# Patient Record
Sex: Male | Born: 1955 | Hispanic: No | Marital: Married | Smoking: Never smoker
Health system: Southern US, Community
[De-identification: ages and names within clinical notes are randomized; demographics above are authoritative.]

## PROBLEM LIST (undated history)

## (undated) DIAGNOSIS — M1990 Unspecified osteoarthritis, unspecified site: Secondary | ICD-10-CM

## (undated) DIAGNOSIS — H269 Unspecified cataract: Secondary | ICD-10-CM

## (undated) DIAGNOSIS — K219 Gastro-esophageal reflux disease without esophagitis: Secondary | ICD-10-CM

## (undated) DIAGNOSIS — I10 Essential (primary) hypertension: Secondary | ICD-10-CM

## (undated) DIAGNOSIS — C61 Malignant neoplasm of prostate: Secondary | ICD-10-CM

## (undated) DIAGNOSIS — G4730 Sleep apnea, unspecified: Secondary | ICD-10-CM

## (undated) DIAGNOSIS — T7840XA Allergy, unspecified, initial encounter: Secondary | ICD-10-CM

## (undated) DIAGNOSIS — C801 Malignant (primary) neoplasm, unspecified: Secondary | ICD-10-CM

## (undated) HISTORY — DX: Malignant neoplasm of prostate: C61

## (undated) HISTORY — DX: Unspecified cataract: H26.9

## (undated) HISTORY — DX: Allergy, unspecified, initial encounter: T78.40XA

## (undated) HISTORY — DX: Unspecified osteoarthritis, unspecified site: M19.90

## (undated) HISTORY — PX: JOINT REPLACEMENT: SHX530

## (undated) HISTORY — PX: PROSTATECTOMY: SHX69

## (undated) HISTORY — PX: PROSTATE SURGERY: SHX751

## (undated) HISTORY — DX: Sleep apnea, unspecified: G47.30

## (undated) HISTORY — PX: HAND TENDON SURGERY: SHX663

## (undated) HISTORY — PX: ROTATOR CUFF REPAIR: SHX139

## (undated) HISTORY — DX: Malignant (primary) neoplasm, unspecified: C80.1

---

## 2006-10-26 ENCOUNTER — Emergency Department (HOSPITAL_COMMUNITY): Admission: EM | Admit: 2006-10-26 | Discharge: 2006-10-26 | Payer: Self-pay | Admitting: Emergency Medicine

## 2007-05-01 ENCOUNTER — Ambulatory Visit: Admission: RE | Admit: 2007-05-01 | Discharge: 2007-06-16 | Payer: Self-pay | Admitting: Radiation Oncology

## 2007-08-12 ENCOUNTER — Encounter (INDEPENDENT_AMBULATORY_CARE_PROVIDER_SITE_OTHER): Payer: Self-pay | Admitting: Urology

## 2007-08-12 ENCOUNTER — Inpatient Hospital Stay (HOSPITAL_COMMUNITY): Admission: RE | Admit: 2007-08-12 | Discharge: 2007-08-13 | Payer: Self-pay | Admitting: Urology

## 2009-08-31 ENCOUNTER — Encounter: Admission: RE | Admit: 2009-08-31 | Discharge: 2009-08-31 | Payer: Self-pay | Admitting: Orthopedic Surgery

## 2011-02-05 NOTE — H&P (Signed)
NAME:  Brian Higgins, Brian Higgins NO.:  [id number]   MEDICAL RECORD NO.:  [id number]          PATIENT TYPE:  INP   LOCATION:  0008                         FACILITY:  [facility]   PHYSICIAN:  Heloise Purpura, MD      DATE OF BIRTH:  1955/10/03   DATE OF ADMISSION:  08/12/2007  DATE OF DISCHARGE:                              HISTORY & PHYSICAL   CHIEF COMPLAINT:  Prostate cancer.   HISTORY:  Brian Higgins is a 55 year old gentleman with clinical stage T1C  prostate cancer with a PSA of 4.6 and Gleason score of 3 +3 = 6.  After  a discussion regarding management options for treatment, the patient  elected to proceed with surgical therapy and a robotic prostatectomy.   PAST MEDICAL HISTORY:  None.   PAST SURGICAL HISTORY:  1. Left knee surgery.  2. Left rotator cuff surgery.   MEDICATIONS:  Ambien.   ALLERGIES:  NO KNOWN DRUG ALLERGIES.   FAMILY HISTORY:  There is a paternal history of prostate cancer.  There  is also history of diabetes and coronary artery disease.  The patient's  sister, mother and grandmother all had breast cancer.   SOCIAL HISTORY:  The patient denies tobacco use.  He drinks  approximately two glass of alcohol per week.  He works for a [employer].  He is currently married.  He did recently lose his  son.   REVIEW OF SYSTEMS:  Complete review of systems was performed, and all  systems are negative.   PHYSICAL EXAMINATION:  CONSTITUTIONAL:  Well-nourished, well-developed,  age-appropriate male in no acute distress.  CARDIOVASCULAR:  Regular rate and rhythm without obvious murmurs.  LUNGS:  Clear bilaterally.  ABDOMEN:  Soft, nontender, nondistended without abdominal masses.  DIGITAL RECTAL EXAM:  Prostate is 50 grams without nodularity or  induration.  EXTREMITIES:  No edema.   IMPRESSION:  Clinically localized adenocarcinoma of the prostate.   PLAN:  Brian Higgins will undergo a robotic-assisted laparoscopic radical  prostatectomy and  then be admitted to the hospital for routine  postoperative care.      Heloise Purpura, MD  Electronically Signed     LB/MEDQ  D:  08/12/2007  T:  08/12/2007  Job:  [id number]

## 2011-02-05 NOTE — Discharge Summary (Signed)
NAME:  Brian Higgins, Brian Higgins NO.:  [id number]   MEDICAL RECORD NO.:  [id number]          PATIENT TYPE:  INP   LOCATION:  1404                         FACILITY:  [facility]   PHYSICIAN:  Heloise Purpura, MD      DATE OF BIRTH:  1956/07/14   DATE OF ADMISSION:  08/12/2007  DATE OF DISCHARGE:  08/13/2007                               DISCHARGE SUMMARY   ADMISSION DIAGNOSIS:  Prostate cancer.   DISCHARGE DIAGNOSIS:  Prostate cancer.   HISTORY AND PHYSICAL:  For full details, please see admission history  and physical.  Briefly, [name] is a 55 year old gentleman with  recently diagnosed clinically localized prostate cancer.  After a  discussion regarding management options for treatment, he elected to  proceed with surgical therapy and a robotic prostatectomy.   HOSPITAL COURSE:  On August 12, 2007, the patient was taken to the  operating room and underwent a robotic-assisted laparoscopic radical  prostatectomy.  He tolerated this procedure well and without  complications.  Postoperatively, he was able to be transferred to a  regular hospital room following recovery from anesthesia and was able to  begin ambulating.  He was monitored closely and remained hemodynamically  stable overnight.  His hematocrit remained stable above 30 the following  morning.  He was begun on a clear liquid diet on postoperative day #1,  which he tolerated without difficulty.  He maintained excellent urine  output with minimal output from his pelvic drain and his pelvic drain  was therefore able to be removed.  He met all discharge criteria and was  discharged home in excellent condition on postoperative day #1.   DISPOSITION:  Home.   DISCHARGE MEDICATIONS:  [name] was instructed to resume his regular  home medications excepting any aspirin, nonsteroidal anti-inflammatory  drugs, or herbal supplements.  He was given a prescription to take  Vicodin as needed for pain and to use  ciprofloxacin beginning 1 day  prior to his return visit for Foley catheter removal.   DISCHARGE INSTRUCTIONS:  He was instructed to be ambulatory, but  specifically told to refrain from any heavy lifting, strenuous activity,  or driving.  He was instructed to gradually advance his diet once  passing flatus.  He was also instructed on routine Foley catheter care.   FOLLOWUP:  [name] will follow up in 1 week for removal of his Foley  catheter.      Heloise Purpura, MD  Electronically Signed     LB/MEDQ  D:  08/13/2007  T:  08/14/2007  Job:  [id number]

## 2011-02-05 NOTE — Op Note (Signed)
NAME:  Brian Higgins, BERTHA NO.:  [id number]   MEDICAL RECORD NO.:  [id number]          PATIENT TYPE:  INP   LOCATION:  1404                         FACILITY:  [facility]   PHYSICIAN:  Heloise Purpura, MD      DATE OF BIRTH:  1956-06-25   DATE OF PROCEDURE:  08/12/2007  DATE OF DISCHARGE:                               OPERATIVE REPORT   PREOPERATIVE DIAGNOSIS:  Clinically localized adenocarcinoma of  prostate.   POSTOPERATIVE DIAGNOSIS:  Clinically localized adenocarcinoma of  prostate.   SURGEON:  Dr. Heloise Purpura.   ASSISTANT:  Duane Boston, MD   ANESTHESIA:  General.   PROCEDURE:  Robotic assisted laparoscopic radical prostatectomy  (bilateral nerve sparing).   COMPLICATIONS:  None.   ESTIMATED BLOOD LOSS:  200 mL.   SPECIMENS:  Prostate seminal vesicles.   DISPOSITION OF SPECIMENS:  To pathology.   DRAINS:  1. 20-French straight catheter.  2. #19 Blake pelvic drain.   INDICATIONS:  [name] is a 55 year old gentleman with clinically  localized adenocarcinoma of the prostate.  After discussion regarding  management options for treatment, he elected to proceed with surgical  therapy and the above procedure.  Potential risks and complications as  well as alternative treatment options were discussed in detail and  informed consent was obtained.   DESCRIPTION OF PROCEDURE:  The patient was taken to the operating room  and a general anesthetic was administered.  He was given preoperative  antibiotics, placed in the dorsal lithotomy position, and prepped and  draped in the usual sterile fashion.  Next preoperative time-out was  performed.  A site was selected just below the umbilicus in the midline  for placement of the camera port.  This was placed using a standard open  Hasson technique.  This allowed entry into the peritoneal cavity under  direct vision without difficulty.  A 12 mm port was then placed and a  pneumoperitoneum was established.  A 0  degrees lens was used to inspect  the abdomen and there was no evidence of any intra-abdominal injuries or  other abnormalities.  The remaining ports were then placed.  Bilateral 8  mm ports were placed lateral and just inferior to the camera port.  An  additional 8 mm robotic port was placed in the far left lateral  abdominal wall.  5 mm port was placed between the camera port and the  right robotic port.  An additional 12 mm port was placed in the far  right lateral abdominal wall for laparoscopic assistance.  All ports  were placed under direct vision and without difficulty.  The surgical  cart was docked.  With the aid of the cautery scissors, the bladder was  reflected posteriorly allowing entry into the space of Retzius and  identification of the endopelvic fascia and prostate.  The endopelvic  fascia was incised from the apex back to the base of the prostate  bilaterally and the underlying levator muscle fibers were swept  laterally off the prostate thereby isolating the dorsal venous complex.  The dorsal venous complex was then stapled and divided with  the 45 mm  Flex ETS stapler.  The bladder neck was identified with the aid of Foley  catheter manipulation and was divided anteriorly thereby exposing the  catheter.  The catheter balloon was brought into the operative field and  used to retract the prostate anteriorly.  The posterior bladder neck was  then divided and dissection continued between the bladder neck and  prostate until the vasa deferentia and seminal vesicles were identified.  Vasa deferentia were isolated, divided and lifted anteriorly.  The  seminal vesicles were dissected down to their tips where Hem-o-lok clips  were used to control the seminal vesicle arterial blood supply.  The  seminal vesicles were then lifted anteriorly and the space between  Denonvilliers fascia and the anterior rectum was bluntly developed  thereby isolating the vascular pedicles of the  prostate.  The lateral  prostatic fascia was then incised allowing the neurovascular bundles to  be swept laterally and posteriorly off the prostate.  The vascular  pedicles of the prostate were then ligated with Hem-o-lok clips and  divided with sharp cold scissor dissection above the level of the  neurovascular bundles.  The nerve bundles were swept off the apex of the  prostate and the urethra was sharply divided allowing the prostate  specimen to be disarticulated.  With irrigation in the pelvis, air was  injected into the rectal catheter and there was no evidence of a rectal  injury.  Hemostasis was ensured.  After figure-of-eight sutures were  replaced to control arterial bleeding along the right neurovascular  bundle.  Attention then turned to the urethral anastomosis.  A  reapproximation stitch between Denonvilliers fascia and the posterior  urethral tissue was performed with a 3-0 Vicryl suture.  A 2-0 Vicryl  slip-knot was placed at the 6 o'clock position between the bladder neck  and urethra to reapproximate these structures and a 3-0 Monocryl suture  was used to perform a 360 degrees running tension-free anastomosis  between the bladder neck and urethra.  A new 20-French coude catheter  was inserted into the bladder and irrigated.  There no blood clots  within the bladder and the anastomosis appeared to be watertight.  A #19  Blake drain was brought through the left robotic port and appropriately  positioned in the pelvis.  It was secured to skin with a nylon suture.  The surgical cart was then undocked.  The right lateral 12 mm port site  was closed with a 0 Vicryl suture placed with the aid of the suture  passer device.  The remaining ports were removed under direct vision.  The prostate specimen was then removed intact within the Endopouch  retrieval bag via the periumbilical port site.  This fascial opening was  then closed with a running 0 Vicryl suture.  All ports were  injected  with 0.25% Marcaine and reapproximated at skin level with staples.  Sterile dressings were applied.  The patient appeared to tolerate the  procedure well without complications.  He was able to be extubated and  transferred to recovery unit in satisfactory condition.      Heloise Purpura, MD  Electronically Signed     LB/MEDQ  D:  08/12/2007  T:  08/13/2007  Job:  [id number]

## 2011-02-08 NOTE — Consult Note (Signed)
NAME:  Brian Higgins, Brian Higgins NO.:  [id number]   MEDICAL RECORD NO.:  [id number]          PATIENT TYPE:  EMS   LOCATION:  ED                           FACILITY:  [facility]   PHYSICIAN:  Excell Seltzer. Annabell Howells, M.D.    DATE OF BIRTH:  1956/08/13   DATE OF CONSULTATION:  DATE OF DISCHARGE:                                 CONSULTATION   Brian Higgins is a 55 year old white male who underwent a prostate biopsy  on Friday.  He has been having some peritoneal discomfort and chills  today with a subnormal temperature.  It was felt that further evaluation  was indicated to rule out any sort of bacteremia.  The patient denies  any dysuria.  He reports his discomfort is mild.  He has used Tylenol,  which has helped the chills.   PAST HISTORY:   ALLERGIES:  Significant for no drug allergies.   MEDICATIONS:  No current medications, although he took Cipro and got  Rocephin for his biopsy.   MEDICAL HISTORY:  Otherwise unremarkable.   SURGICAL HISTORY:  Pertinent for left knee surgery and a recent rotator  cuff repair.   FAMILY HISTORY:  Pertinent for prostate cancer.   SOCIAL HISTORY:  Pertinent for moderate alcohol.  The patient denies  tobacco use.   REVIEW OF SYSTEMS:  He is otherwise without complaints.  He denies  abdominal pain, shortness of breath or hematuria.   PHYSICAL EXAMINATION:  VITAL SIGNS:  His blood pressure is 135/87, heart  rate 72, temperature 98.2.  GENERAL:  He is a well-developed, well-nourished white male in no acute  distress, alert and oriented x3.  HEAD/FACE:  Normocephalic and atraumatic.  NECK:  Supple without mass.  LUNGS:  Clear with normal effort.  HEART:  Regular rate and rhythm.  ABDOMEN:  Soft, flat and nontender without mass or hepatosplenomegaly or  CVA tenderness.  GU/RECTAL:  Not performed.   I had blood cultures, CBC and BMET obtained, as well as a urinalysis  with culture and initiated Fortaz 2 cm IV, as we had found that to be an  antibiotic that could cover organisms that are otherwise resistant to  Rocephin and Cipro.   His CBC returned with a white count of 6.3 with no left shift,  hemoglobin of 13.1, platelet count of 306.   His urinalysis is clear.   His serum chemistries are within normal limits.   IMPRESSION:  Chills and perineal discomfort following a prostate biopsy  for an elevated PSA.   PLAN:  At this point, since he does not appear to be bacteremic or  septic, and he has been given a dose of antibiotics which would cover  any potential resistance to organisms, it was felt safe to send him  home.  He will follow up with Dr. Laverle Patter, as previously planned.      Excell Seltzer. Annabell Howells, M.D.  Electronically Signed     JJW/MEDQ  D:  10/26/2006  T:  10/26/2006  Job:  [id number]   cc:   Brian Higgins, M.D.  Fax: [phone]

## 2011-07-02 LAB — BASIC METABOLIC PANEL
BUN: 14
CO2: 27
Calcium: 8.9
Chloride: 106
Creatinine, Ser: 0.76
GFR calc Af Amer: >60
GFR calc non Af Amer: >60
Glucose, Bld: 97
Potassium: 3.9
Sodium: 138

## 2011-07-02 LAB — CBC
HCT: 39.9
Hemoglobin: 14.3
MCHC: 35.8
MCV: 88.9
Platelets: 185
RBC: 4.48
RDW: 13.4
WBC: 5

## 2011-07-02 LAB — ABO/RH: ABO/RH(D): A NEG

## 2011-07-02 LAB — TYPE AND SCREEN
ABO/RH(D): A NEG
Antibody Screen: NEGATIVE

## 2011-07-02 LAB — HEMOGLOBIN AND HEMATOCRIT, BLOOD
HCT: 33.9 — ABNORMAL LOW
HCT: 36.8 — ABNORMAL LOW
Hemoglobin: 11.8 — ABNORMAL LOW
Hemoglobin: 12.9 — ABNORMAL LOW

## 2012-11-21 ENCOUNTER — Encounter: Payer: Self-pay | Admitting: *Deleted

## 2012-11-21 DIAGNOSIS — C61 Malignant neoplasm of prostate: Secondary | ICD-10-CM | POA: Insufficient documentation

## 2015-05-22 ENCOUNTER — Ambulatory Visit (INDEPENDENT_AMBULATORY_CARE_PROVIDER_SITE_OTHER): Payer: BLUE CROSS/BLUE SHIELD | Admitting: Family Medicine

## 2015-05-22 ENCOUNTER — Encounter: Payer: Self-pay | Admitting: Family Medicine

## 2015-05-22 VITALS — BP 150/82 | HR 96 | Temp 97.9°F | Resp 16 | Ht 74.0 in | Wt 274.0 lb

## 2015-05-22 DIAGNOSIS — E663 Overweight: Secondary | ICD-10-CM

## 2015-05-22 DIAGNOSIS — J309 Allergic rhinitis, unspecified: Secondary | ICD-10-CM

## 2015-05-22 DIAGNOSIS — Z23 Encounter for immunization: Secondary | ICD-10-CM

## 2015-05-22 NOTE — Progress Notes (Signed)
Subjective:  This chart was scribed for Brian Ray, MD by Moises Blood, Medical Scribe. This patient was seen in room 25 and the patient's care was started 3:48 PM.    Patient ID: Brian Higgins, male    DOB: [birth year], 59 y.o.   MRN: [id number]  HPI Brian Higgins is a 59 y.o. male He is a new pt with me here to establish care. H/o prostate cancer, s/p prostatectomy. Also suffers from allergic rhinitis. His previous PCP was Dr. Laney Pastor but that was a few years ago. He sees Dr. Alinda Money as his urologist. He did not have bloodwork done this morning and he denies fasting today.   Immunizations He has received a flu shot today.   Swollen glands He feels some swollen glands around his neck for about a week. 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 He would feeling his throat sore when the glands feel full. His wife didn't feel well for a few days with rhinorrhea. He has been taking OTC Zyrtec the last few days for allergies and would usually takes them for a month. He denies fever, trouble swallowing, back pain.   He has dogs at home.   Weight He knows he needs to lose some weight but he's been busy with work.  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 He often drives to [place].   Prostate Cancer His prostatectomy was in Nov 2008.  PSA last checked normal. He used to check quarterly, now annual test is fine.   Exercise He plays tennis 3-4 times a week. He also does elliptical and bike machine 5-6 days a week for at least 30 minutes. He also does some light weights. He stretches before he starts. He takes some ibuprofen for aches after he exercises.   Surgery He had his shoulders done. He also has trouble with knees that needs replacement.   Blood Pressure BP usually measures lower than today's measurement. He drinks 2-3 beer a night every now and then.   Diabetes His father was diabetic. His brother is pre-DM.  He usually eats salads  for lunch with the full dressing packet. He eats breakfast every day. He tries to do about 8 glasses of water a day. He usually finishes his meal when he goes out to eat without leftovers. He usually eats later in the evening too.     Patient Active Problem List   Diagnosis Date Noted  . Prostate cancer 11/21/2012   Past Medical History  Diagnosis Date  . Allergy   . Prostate cancer   . Arthritis    Past Surgical History  Procedure Laterality Date  . Prostatectomy    . Rotator cuff repair Bilateral left 2007 and right 09/2009   No Known Allergies Prior to Admission medications   Medication Sig Start Date End Date Taking? Authorizing Provider  Omega-3 Fatty Acids (FISH OIL) 1000 MG CAPS Take by mouth.    Historical Provider, MD  tadalafil (CIALIS) 20 MG tablet Take 20 mg by mouth daily as needed for erectile dysfunction.    Historical Provider, MD   Social History   Social History  . Marital Status: Unknown    Spouse Name: N/A  . Number of Children: N/A  . Years of Education: N/A   Occupational History  . Not on file.   Social History Main Topics  . Smoking status: Never Smoker   . Smokeless tobacco: Not on file  . Alcohol Use: 2.4  oz/week    4 Standard drinks or equivalent per week  . Drug Use: No  . Sexual Activity: Not on file   Other Topics Concern  . Not on file   Social History Narrative    Review of Systems  Constitutional: Negative for fever.  HENT: Negative for trouble swallowing.   Musculoskeletal: Negative for back pain.       Objective:   Physical Exam  Constitutional: He is oriented to person, place, and time. He appears well-developed and well-nourished.  HENT:  Head: Normocephalic and atraumatic.  Right Ear: Tympanic membrane, external ear and ear canal normal.  Left Ear: Tympanic membrane, external ear and ear canal normal.  Nose: No rhinorrhea.  Mouth/Throat: Oropharynx is clear and moist and mucous membranes are normal. No oropharyngeal  exudate or posterior oropharyngeal erythema.  Eyes: Conjunctivae are normal. Pupils are equal, round, and reactive to light.  Neck: Neck supple.  Cardiovascular: Normal rate, regular rhythm, normal heart sounds and intact distal pulses.   No murmur heard. Pulmonary/Chest: Effort normal and breath sounds normal. He has no wheezes. He has no rhonchi. He has no rales.  Abdominal: Soft. There is no tenderness.  Lymphadenopathy:    He has no cervical adenopathy.  Neurological: He is alert and oriented to person, place, and time.  Skin: Skin is warm and dry. No rash noted.  Psychiatric: He has a normal mood and affect. His behavior is normal.  Vitals reviewed.   Filed Vitals:   05/22/15 1534  BP: 150/82  Pulse: 96  Temp: 97.9 F (36.6 C)  TempSrc: Oral  Resp: 16  Height: 6\' 2"  (1.88 m)  Weight: 274 lb (124.286 kg)   Body mass index is 35.16 kg/(m^2).      Assessment & Plan:   Brian Higgins is a 59 y.o. male Flu vaccine need - Plan: Flu Vaccine QUAD 36+ mos IM given.  Overweight  -Various techniques discussed for weight loss including both diet and exercise  -Suspect diet would be the primary component. Discussed portion control, avoiding calorie laden beverages, drinking more water, avoiding eating late at night, avoiding fast food/restaurant food and cooking at home as much as possible, and decreasing salad dressing by half.   -Follow-up in the next few months, check weight and BMI at that time, as well as plan on possible A1c for screening for diabetes/prediabetes.  Allergic rhinitis, unspecified allergic rhinitis type  -Suspected allergies versus viral syndrome with sore lymph nodes, but none were appreciably enlarged on exam today. If this discomfort persists or any enlargement of the lymph nodes further from exam today, return for recheck.  Meds ordered this encounter  Medications  . cetirizine (ZYRTEC) 10 MG tablet    Sig: Take 10 mg by mouth daily.   Patient  Instructions  Try some of the techniques for weight management as we discussed: Portion sizes (smaller plate).  Decrease fast food and try to cook at home as much as possible.  1/2 salad dressing and shake salad prior to eating. Eating earlier at night.  Continue the good work with exercise.   If your glands do not continue to improve in the next week - return for recheck and possible other testing.  Return to the clinic or go to the nearest emergency room if any of your symptoms worsen or new symptoms occur.  We can plan on fasting bloodwork at physical in next few months. Let me know if I can help you in the meantime.  I personally performed the services described in this documentation, which was scribed in my presence. The recorded information has been reviewed and considered, and addended by me as needed.

## 2015-05-22 NOTE — Patient Instructions (Addendum)
Try some of the techniques for weight management as we discussed: Portion sizes (smaller plate).  Decrease fast food and try to cook at home as much as possible.  1/2 salad dressing and shake salad prior to eating. Eating earlier at night.  Continue the good work with exercise.   If your glands do not continue to improve in the next week - return for recheck and possible other testing.  Return to the clinic or go to the nearest emergency room if any of your symptoms worsen or new symptoms occur.  We can plan on fasting bloodwork at physical in next few months. Let me know if I can help you in the meantime.

## 2015-07-14 LAB — HM COLONOSCOPY

## 2015-07-17 ENCOUNTER — Ambulatory Visit (INDEPENDENT_AMBULATORY_CARE_PROVIDER_SITE_OTHER): Payer: BLUE CROSS/BLUE SHIELD | Admitting: Family Medicine

## 2015-07-17 ENCOUNTER — Encounter: Payer: Self-pay | Admitting: Family Medicine

## 2015-07-17 VITALS — BP 146/85 | HR 58 | Temp 97.9°F | Resp 16 | Ht 74.0 in | Wt 269.0 lb

## 2015-07-17 DIAGNOSIS — Z0000 Encounter for general adult medical examination without abnormal findings: Secondary | ICD-10-CM | POA: Diagnosis not present

## 2015-07-17 DIAGNOSIS — Z8546 Personal history of malignant neoplasm of prostate: Secondary | ICD-10-CM | POA: Diagnosis not present

## 2015-07-17 DIAGNOSIS — G4700 Insomnia, unspecified: Secondary | ICD-10-CM | POA: Diagnosis not present

## 2015-07-17 DIAGNOSIS — Z1159 Encounter for screening for other viral diseases: Secondary | ICD-10-CM | POA: Diagnosis not present

## 2015-07-17 DIAGNOSIS — Z125 Encounter for screening for malignant neoplasm of prostate: Secondary | ICD-10-CM | POA: Diagnosis not present

## 2015-07-17 DIAGNOSIS — I10 Essential (primary) hypertension: Secondary | ICD-10-CM | POA: Diagnosis not present

## 2015-07-17 DIAGNOSIS — Z114 Encounter for screening for human immunodeficiency virus [HIV]: Secondary | ICD-10-CM

## 2015-07-17 DIAGNOSIS — Z13220 Encounter for screening for lipoid disorders: Secondary | ICD-10-CM

## 2015-07-17 LAB — POCT URINALYSIS DIP (MANUAL ENTRY)
Bilirubin, UA: NEGATIVE
Blood, UA: NEGATIVE
Glucose, UA: NEGATIVE
Ketones, POC UA: NEGATIVE
Leukocytes, UA: NEGATIVE
Nitrite, UA: NEGATIVE
Protein Ur, POC: NEGATIVE
Spec Grav, UA: 1.025
Urobilinogen, UA: 0.2
pH, UA: 5

## 2015-07-17 LAB — LIPID PANEL
Cholesterol: 193 mg/dL (ref 125–200)
HDL: 36 mg/dL — ABNORMAL LOW (ref >=40)
LDL Cholesterol: 115 mg/dL (ref <130)
Total CHOL/HDL Ratio: 5.4 Ratio — ABNORMAL HIGH (ref <=5.0)
Triglycerides: 209 mg/dL — ABNORMAL HIGH (ref <150)
VLDL: 42 mg/dL — ABNORMAL HIGH (ref <30)

## 2015-07-17 LAB — HIV ANTIBODY (ROUTINE TESTING W REFLEX): HIV 1&2 Ab, 4th Generation: NONREACTIVE

## 2015-07-17 LAB — HEPATITIS C ANTIBODY: HCV Ab: NEGATIVE

## 2015-07-17 MED ORDER — HYDROCHLOROTHIAZIDE 12.5 MG PO CAPS: 12.5000 mg | ORAL_CAPSULE | Freq: Every day | ORAL | Status: DC

## 2015-07-17 NOTE — Patient Instructions (Addendum)
For insomnia - see info below. Ok to continue melatonin, but if symptoms persist while taking this - may need sleep study or other evaluation. Return to discuss further if needed.   Start hydrochlorothiazide for blood pressure. Keep a record of your blood pressures outside of the office and bring them to the next office visit. Follow up in 3 months for blood pressure assessment.  You should receive a call or letter about your lab results within the next week to 10 days.   Return to the clinic or go to the nearest emergency room if any of your symptoms worsen or new symptoms occur.  Insomnia Insomnia is a sleep disorder that makes it difficult to fall asleep or to stay asleep. Insomnia can cause tiredness (fatigue), low energy, difficulty concentrating, mood swings, and poor performance at work or school.  There are three different ways to classify insomnia: 1. Difficulty falling asleep. 2. Difficulty staying asleep. 3. Waking up too early in the morning. Any type of insomnia can be long-term (chronic) or short-term (acute). Both are common. Short-term insomnia usually lasts for three months or less. Chronic insomnia occurs at least three times a week for longer than three months. CAUSES  Insomnia may be caused by another condition, situation, or substance, such as: 1. Anxiety. 2. Certain medicines. 3. Gastroesophageal reflux disease (GERD) or other gastrointestinal conditions. 4. Asthma or other breathing conditions. 5. Restless legs syndrome, sleep apnea, or other sleep disorders. 6. Chronic pain. 7. Menopause. This may include hot flashes. 8. Stroke. 9. Abuse of alcohol, tobacco, or illegal drugs. 10. Depression. 11. Caffeine.  12. Neurological disorders, such as Alzheimer disease. 13. An overactive thyroid (hyperthyroidism). The cause of insomnia may not be known. RISK FACTORS Risk factors for insomnia include: 1. Gender. Women are more commonly affected than men. 2. Age.  Insomnia is more common as you get older. 3. Stress. This may involve your professional or personal life. 4. Income. Insomnia is more common in people with lower income. 5. Lack of exercise.  6. Irregular work schedule or night shifts. 7. Traveling between different time zones. SIGNS AND SYMPTOMS If you have insomnia, trouble falling asleep or trouble staying asleep is the main symptom. This may lead to other symptoms, such as:  Feeling fatigued.  Feeling nervous about going to sleep.  Not feeling rested in the morning.  Having trouble concentrating.  Feeling irritable, anxious, or depressed. TREATMENT  Treatment for insomnia depends on the cause. If your insomnia is caused by an underlying condition, treatment will focus on addressing the condition. Treatment may also include:   Medicines to help you sleep.  Counseling or therapy.  Lifestyle adjustments. HOME CARE INSTRUCTIONS   Take medicines only as directed by your health care provider.  Keep regular sleeping and waking hours. Avoid naps.  Keep a sleep diary to help you and your health care provider figure out what could be causing your insomnia. Include:   When you sleep.  When you wake up during the night.  How well you sleep.   How rested you feel the next day.  Any side effects of medicines you are taking.  What you eat and drink.   Make your bedroom a comfortable place where it is easy to fall asleep:  Put up shades or special blackout curtains to block light from outside.  Use a white noise machine to block noise.  Keep the temperature cool.   Exercise regularly as directed by your health care provider. Avoid exercising  right before bedtime.  Use relaxation techniques to manage stress. Ask your health care provider to suggest some techniques that may work well for you. These may include:  Breathing exercises.  Routines to release muscle tension.  Visualizing peaceful scenes.  Cut back on  alcohol, caffeinated beverages, and cigarettes, especially close to bedtime. These can disrupt your sleep.  Do not overeat or eat spicy foods right before bedtime. This can lead to digestive discomfort that can make it hard for you to sleep.  Limit screen use before bedtime. This includes:  Watching TV.  Using your smartphone, tablet, and computer.  Stick to a routine. This can help you fall asleep faster. Try to do a quiet activity, brush your teeth, and go to bed at the same time each night.  Get out of bed if you are still awake after 15 minutes of trying to sleep. Keep the lights down, but try reading or doing a quiet activity. When you feel sleepy, go back to bed.  Make sure that you drive carefully. Avoid driving if you feel very sleepy.  Keep all follow-up appointments as directed by your health care provider. This is important. SEEK MEDICAL CARE IF:   You are tired throughout the day or have trouble in your daily routine due to sleepiness.  You continue to have sleep problems or your sleep problems get worse. SEEK IMMEDIATE MEDICAL CARE IF:   You have serious thoughts about hurting yourself or someone else.   This information is not intended to replace advice given to you by your health care provider. Make sure you discuss any questions you have with your health care provider.   Document Released: 09/06/2000 Document Revised: 05/31/2015 Document Reviewed: 06/10/2014 Elsevier Interactive Patient Education 2016 [employer].  Hypertension Hypertension, commonly called high blood pressure, is when the force of blood pumping through your arteries is too strong. Your arteries are the blood vessels that carry blood from your heart throughout your body. A blood pressure reading consists of a higher number over a lower number, such as 110/72. The higher number (systolic) is the pressure inside your arteries when your heart pumps. The lower number (diastolic) is the pressure inside  your arteries when your heart relaxes. Ideally you want your blood pressure below 120/80. Hypertension forces your heart to work harder to pump blood. Your arteries may become narrow or stiff. Having untreated or uncontrolled hypertension can cause heart attack, stroke, kidney disease, and other problems. RISK FACTORS Some risk factors for high blood pressure are controllable. Others are not.  Risk factors you cannot control include:  4. Race. You may be at higher risk if you are African American. 5. Age. Risk increases with age. 6. Gender. Men are at higher risk than women before age 67 years. After age 37, women are at higher risk than men. Risk factors you can control include: 14. Not getting enough exercise or physical activity. 40. Being overweight. 16. Getting too much fat, sugar, calories, or salt in your diet. 17. Drinking too much alcohol. SIGNS AND SYMPTOMS Hypertension does not usually cause signs or symptoms. Extremely high blood pressure (hypertensive crisis) may cause headache, anxiety, shortness of breath, and nosebleed. DIAGNOSIS To check if you have hypertension, your health care provider will measure your blood pressure while you are seated, with your arm held at the level of your heart. It should be measured at least twice using the same arm. Certain conditions can cause a difference in blood pressure between your right  and left arms. A blood pressure reading that is higher than normal on one occasion does not mean that you need treatment. If it is not clear whether you have high blood pressure, you may be asked to return on a different day to have your blood pressure checked again. Or, you may be asked to monitor your blood pressure at home for 1 or more weeks. TREATMENT Treating high blood pressure includes making lifestyle changes and possibly taking medicine. Living a healthy lifestyle can help lower high blood pressure. You may need to change some of your habits. Lifestyle  changes may include: 8. Following the DASH diet. This diet is high in fruits, vegetables, and whole grains. It is low in salt, red meat, and added sugars. 9. Keep your sodium intake below 2,300 mg per day. 10. Getting at least 30-45 minutes of aerobic exercise at least 4 times per week. 78. Losing weight if necessary. 12. Not smoking. 13. Limiting alcoholic beverages. 14. Learning ways to reduce stress. Your health care provider may prescribe medicine if lifestyle changes are not enough to get your blood pressure under control, and if one of the following is true:  You are 45-14 years of age and your systolic blood pressure is above 140.  You are 29 years of age or older, and your systolic blood pressure is above 150.  Your diastolic blood pressure is above 90.  You have diabetes, and your systolic blood pressure is over 595 or your diastolic blood pressure is over 90.  You have kidney disease and your blood pressure is above 140/90.  You have heart disease and your blood pressure is above 140/90. Your personal target blood pressure may vary depending on your medical conditions, your age, and other factors. HOME CARE INSTRUCTIONS  Have your blood pressure rechecked as directed by your health care provider.   Take medicines only as directed by your health care provider. Follow the directions carefully. Blood pressure medicines must be taken as prescribed. The medicine does not work as well when you skip doses. Skipping doses also puts you at risk for problems.  Do not smoke.   Monitor your blood pressure at home as directed by your health care provider. SEEK MEDICAL CARE IF:   You think you are having a reaction to medicines taken.  You have recurrent headaches or feel dizzy.  You have swelling in your ankles.  You have trouble with your vision. SEEK IMMEDIATE MEDICAL CARE IF:  You develop a severe headache or confusion.  You have unusual weakness, numbness, or feel  faint.  You have severe chest or abdominal pain.  You vomit repeatedly.  You have trouble breathing. MAKE SURE YOU:   Understand these instructions.  Will watch your condition.  Will get help right away if you are not doing well or get worse.   This information is not intended to replace advice given to you by your health care provider. Make sure you discuss any questions you have with your health care provider.   Document Released: 09/09/2005 Document Revised: 01/24/2015 Document Reviewed: 07/02/2013 Elsevier Interactive Patient Education 2016 [employer] healthy  Get these tests  Blood pressure- Have your blood pressure checked once a year by your healthcare provider.  Normal blood pressure is 120/80  Weight- Have your body mass index (BMI) calculated to screen for obesity.  BMI is a measure of body fat based on height and weight. You can also calculate your own BMI at [email or web address].  Cholesterol- Have your cholesterol checked every year.  Diabetes- Have your blood sugar checked regularly if you have high blood pressure, high cholesterol, have a family history of diabetes or if you are overweight.  Screening for Colon Cancer- Colonoscopy starting at age 45.  Screening may begin sooner depending on your family history and other health conditions. Follow up colonoscopy as directed by your Gastroenterologist.  Screening for Prostate Cancer- Both blood work (PSA) and a rectal exam help screen for Prostate Cancer.  Screening begins at age 60 with African-American men and at age 92 with Caucasian men.  Screening may begin sooner depending on your family history.  Take these medicines  Aspirin- One aspirin daily can help prevent Heart disease and Stroke.  Flu shot- Every fall.  Tetanus- Every 10 years.  Zostavax- Once after the age of 69 to prevent Shingles.  Pneumonia shot- Once after the age of 69; if you are younger than 49, ask your healthcare  provider if you need a Pneumonia shot.  Take these steps  Don't smoke- If you do smoke, talk to your doctor about quitting.  For tips on how to quit, go to www.smokefree.gov or call 1-800-QUIT-NOW.  Be physically active- Exercise 5 days a week for at least 30 minutes.  If you are not already physically active start slow and gradually work up to 30 minutes of moderate physical activity.  Examples of moderate activity include walking briskly, mowing the yard, dancing, swimming, bicycling, etc.  Eat a healthy diet- Eat a variety of healthy food such as fruits, vegetables, low fat milk, low fat cheese, yogurt, lean meant, poultry, fish, beans, tofu, etc. For more information go to www.thenutritionsource.org  Drink alcohol in moderation- Limit alcohol intake to less than two drinks a day. Never drink and drive.  Dentist- Brush and floss twice daily; visit your dentist twice a year.  Depression- Your emotional health is as important as your physical health. If you're feeling down, or losing interest in things you would normally enjoy please talk to your healthcare provider.  Eye exam- Visit your eye doctor every year.  Safe sex- If you may be exposed to a sexually transmitted infection, use a condom.  Seat belts- Seat belts can save your life; always wear one.  Smoke/Carbon Monoxide detectors- These detectors need to be installed on the appropriate level of your home.  Replace batteries at least once a year.  Skin cancer- When out in the sun, cover up and use sunscreen 15 SPF or higher.  Violence- If anyone is threatening you, please tell your healthcare provider.  Living Will/ Health care power of attorney- Speak with your healthcare provider and family.

## 2015-07-17 NOTE — Progress Notes (Addendum)
Subjective:   This chart was scribed for Brian Ray, MD by Brian Higgins, ED Scribe. This patient was seen in room 21 and the patient's care was started at 9:27 AM.  Patient ID: Brian Higgins, male    DOB: 16-Sep-1956, 59 y.o.   MRN: [id number]  HPI   Chief Complaint  Patient presents with  . Annual Exam    requesting labs for PSA, Thyroid, Lipid,Hep C, HIV.    HPI Comments: Brian Higgins is a 59 y.o. male who presents to the [facility] for a physical. He was a new pt to me in August of this year. Previous pt of Dr. Laney Pastor. Hx of prostate cancer status post prostatectomy. Dr Alinda Money is his urologist. Hx of allergic rhinitis.   Cancer screening  Colon cancer- Dr. Collene Mares. Colonoscopy 10/21- normal, small internal hemorrhoids, repeat in 10 years.  Prostate cancer- hx of prostatectomy 8 years ago. by Dr. Alinda Money.  Last seen by Dr. Alinda Money 1 years ago. PSA to be drawn today.  Immunizations  Immunization History  Administered Date(s) Administered  . Influenza,inj,Quad PF,36+ Mos 05/22/2015   UTD on TDAP.  Discussed shingles vaccines at 67.   Depression  Depression screen [facility] 2/9 07/17/2015 05/22/2015  Decreased Interest 0 0  Down, Depressed, Hopeless 0 0  PHQ - 2 Score 0 0   Denies symptoms of feeling down or depressed.   Vision   Visual Acuity Screening   Right eye Left eye Both eyes  Without correction:     With correction: 20/25 20/30 20/20    He is due for Eye exam. Plans to make an appointment with Dr. Marica Otter.   Dentist He is seen by a dentist every 6 month. Next appointment is December/January.   Exercise  He exercise 6 days a weeks. Stationary bike and elliptical for about 30 minutes,  6 days a week.  2 hours of Tennis 2 nights a week. Walks about 45 minutes. Exercise is limited due to arthritis in left knee. No chest pain, light headedness and difficulty breathing with exercise.   Hypertension  Wt Readings from Last 3 Encounters:    07/17/15 269 lb (122.018 kg)  05/22/15 274 lb (124.286 kg)   Pt reports BP within the last week is the highest its ever been. Last week states BP was 155/95 at Dr. Lorie Higgins office and today it's 146/85. He drinks 4-5 drinks a week, more so on the weekends consisting of red wine. Family hx of HTN from mother. '  Insomnia He has also had some trouble with insomnia, waking up during the night once. Takes melatonin which allows him to sleep the entire night. Denies snoring or apneic episodes.   Lipid screening To be done today   HIV screening To be done today  Hep C screening Ordered today.  Labs performed at Dr. Lorie Higgins office 10/12. Normal CBC. Normal BMP. Normal hepatic function panel. Normal TSH.   Pt is fasting.   Patient Active Problem List   Diagnosis Date Noted  . Prostate cancer ([place]) 11/21/2012   Past Medical History  Diagnosis Date  . Allergy   . Prostate cancer   . Arthritis    Past Surgical History  Procedure Laterality Date  . Prostatectomy    . Rotator cuff repair Bilateral left 2007 and right 09/2009   No Known Allergies Prior to Admission medications   Medication Sig Start Date End Date Taking? Authorizing Provider  cetirizine (ZYRTEC) 10 MG tablet Take 10 mg by  mouth daily.    Historical Provider, MD  Omega-3 Fatty Acids (FISH OIL) 1000 MG CAPS Take by mouth.    Historical Provider, MD   Social History   Social History  . Marital Status: Unknown    Spouse Name: N/A  . Number of Children: N/A  . Years of Education: N/A   Occupational History  . Not on file.   Social History Main Topics  . Smoking status: Never Smoker   . Smokeless tobacco: Not on file  . Alcohol Use: 2.4 oz/week    4 Standard drinks or equivalent per week  . Drug Use: No  . Sexual Activity: Not on file   Other Topics Concern  . Not on file   Social History Narrative   Review of Systems 13 point ROS no positive responses.   Objective:   Physical Exam  Constitutional: He  is oriented to person, place, and time. He appears well-developed and well-nourished.  HENT:  Head: Normocephalic and atraumatic.  Right Ear: External ear normal.  Left Ear: External ear normal.  Mouth/Throat: Oropharynx is clear and moist.  Eyes: Conjunctivae and EOM are normal. Pupils are equal, round, and reactive to light.  Neck: Normal range of motion. Neck supple. No thyromegaly present.  Cardiovascular: Normal rate, regular rhythm, normal heart sounds and intact distal pulses.   Pulmonary/Chest: Effort normal and breath sounds normal. No respiratory distress. He has no wheezes.  Abdominal: Soft. He exhibits no distension. There is no tenderness. Hernia confirmed negative in the right inguinal area and confirmed negative in the left inguinal area.  Musculoskeletal: Normal range of motion. He exhibits no edema or tenderness.  Lymphadenopathy:    He has no cervical adenopathy.  Neurological: He is alert and oriented to person, place, and time. He has normal reflexes.  Skin: Skin is warm and dry.  Psychiatric: He has a normal mood and affect. His behavior is normal.  Vitals reviewed.    Filed Vitals:   07/17/15 0942  BP: 146/85  Pulse: 58  Temp: 97.9 F (36.6 C)  Resp: 16  Height: 6\' 2"  (1.88 m)  Weight: 269 lb (122.018 kg)  Body mass index is 34.52 kg/(m^2).   EKG: sinus bradycardia, no acute findings.   Results for orders placed or performed in visit on 07/17/15  POCT urinalysis dipstick  Result Value Ref Range   Color, UA yellow yellow   Clarity, UA clear clear   Glucose, UA negative negative   Bilirubin, UA negative negative   Ketones, POC UA negative negative   Spec Grav, UA 1.025    Blood, UA negative negative   pH, UA 5.0    Protein Ur, POC negative negative   Urobilinogen, UA 0.2    Nitrite, UA Negative Negative   Leukocytes, UA Negative Negative    Assessment & Plan:   [name] is a 59 y.o. male Annual physical exam  --anticipatory guidance as  below in AVS, screening labs above. Health maintenance items as above in HPI discussed/recommended as applicable.   Essential hypertension - Plan: hydrochlorothiazide (MICROZIDE) 12.5 MG capsule, EKG 12-Lead, POCT urinalysis dipstick  -multiple elevated readings out of office. Start hctz 12.5mg , follow up in next month.   Screening for HIV (human immunodeficiency virus) - Plan: HIV antibody  Need for hepatitis C screening test - Plan: Hepatitis C antibody  Screening for hyperlipidemia - Plan: Lipid panel  H/O prostate cancer - Plan: PSA, Screening for prostate cancer - Plan: PSA  -hxo fo prostatectomy, remote.  Plan on yearly PSA monitoring.   Insomnia  -continue melatonin as needed, sleep hygiene discussed - handout provided. Can discuss further at next ov.  Meds ordered this encounter  Medications  . hydrochlorothiazide (MICROZIDE) 12.5 MG capsule    Sig: Take 1 capsule (12.5 mg total) by mouth daily.    Dispense:  30 capsule    Refill:  2   Patient Instructions  For insomnia - see info below. Ok to continue melatonin, but if symptoms persist while taking this - may need sleep study or other evaluation. Return to discuss further if needed.   Start hydrochlorothiazide for blood pressure. Keep a record of your blood pressures outside of the office and bring them to the next office visit. Follow up in 3 months for blood pressure assessment.  You should receive a call or letter about your lab results within the next week to 10 days.   Return to the clinic or go to the nearest emergency room if any of your symptoms worsen or new symptoms occur.  Insomnia Insomnia is a sleep disorder that makes it difficult to fall asleep or to stay asleep. Insomnia can cause tiredness (fatigue), low energy, difficulty concentrating, mood swings, and poor performance at work or school.  There are three different ways to classify insomnia: 1. Difficulty falling asleep. 2. Difficulty staying  asleep. 3. Waking up too early in the morning. Any type of insomnia can be long-term (chronic) or short-term (acute). Both are common. Short-term insomnia usually lasts for three months or less. Chronic insomnia occurs at least three times a week for longer than three months. CAUSES  Insomnia may be caused by another condition, situation, or substance, such as: 1. Anxiety. 2. Certain medicines. 3. Gastroesophageal reflux disease (GERD) or other gastrointestinal conditions. 4. Asthma or other breathing conditions. 5. Restless legs syndrome, sleep apnea, or other sleep disorders. 6. Chronic pain. 7. Menopause. This may include hot flashes. 8. Stroke. 9. Abuse of alcohol, tobacco, or illegal drugs. 10. Depression. 11. Caffeine.  12. Neurological disorders, such as Alzheimer disease. 13. An overactive thyroid (hyperthyroidism). The cause of insomnia may not be known. RISK FACTORS Risk factors for insomnia include: 1. Gender. Women are more commonly affected than men. 2. Age. Insomnia is more common as you get older. 3. Stress. This may involve your professional or personal life. 4. Income. Insomnia is more common in people with lower income. 5. Lack of exercise.  6. Irregular work schedule or night shifts. 7. Traveling between different time zones. SIGNS AND SYMPTOMS If you have insomnia, trouble falling asleep or trouble staying asleep is the main symptom. This may lead to other symptoms, such as:  Feeling fatigued.  Feeling nervous about going to sleep.  Not feeling rested in the morning.  Having trouble concentrating.  Feeling irritable, anxious, or depressed. TREATMENT  Treatment for insomnia depends on the cause. If your insomnia is caused by an underlying condition, treatment will focus on addressing the condition. Treatment may also include:   Medicines to help you sleep.  Counseling or therapy.  Lifestyle adjustments. HOME CARE INSTRUCTIONS   Take medicines  only as directed by your health care provider.  Keep regular sleeping and waking hours. Avoid naps.  Keep a sleep diary to help you and your health care provider figure out what could be causing your insomnia. Include:   When you sleep.  When you wake up during the night.  How well you sleep.   How rested you feel the next  day.  Any side effects of medicines you are taking.  What you eat and drink.   Make your bedroom a comfortable place where it is easy to fall asleep:  Put up shades or special blackout curtains to block light from outside.  Use a white noise machine to block noise.  Keep the temperature cool.   Exercise regularly as directed by your health care provider. Avoid exercising right before bedtime.  Use relaxation techniques to manage stress. Ask your health care provider to suggest some techniques that may work well for you. These may include:  Breathing exercises.  Routines to release muscle tension.  Visualizing peaceful scenes.  Cut back on alcohol, caffeinated beverages, and cigarettes, especially close to bedtime. These can disrupt your sleep.  Do not overeat or eat spicy foods right before bedtime. This can lead to digestive discomfort that can make it hard for you to sleep.  Limit screen use before bedtime. This includes:  Watching TV.  Using your smartphone, tablet, and computer.  Stick to a routine. This can help you fall asleep faster. Try to do a quiet activity, brush your teeth, and go to bed at the same time each night.  Get out of bed if you are still awake after 15 minutes of trying to sleep. Keep the lights down, but try reading or doing a quiet activity. When you feel sleepy, go back to bed.  Make sure that you drive carefully. Avoid driving if you feel very sleepy.  Keep all follow-up appointments as directed by your health care provider. This is important. SEEK MEDICAL CARE IF:   You are tired throughout the day or have  trouble in your daily routine due to sleepiness.  You continue to have sleep problems or your sleep problems get worse. SEEK IMMEDIATE MEDICAL CARE IF:   You have serious thoughts about hurting yourself or someone else.   This information is not intended to replace advice given to you by your health care provider. Make sure you discuss any questions you have with your health care provider.   Document Released: 09/06/2000 Document Revised: 05/31/2015 Document Reviewed: 06/10/2014 Elsevier Interactive Patient Education 2016 [employer].  Hypertension Hypertension, commonly called high blood pressure, is when the force of blood pumping through your arteries is too strong. Your arteries are the blood vessels that carry blood from your heart throughout your body. A blood pressure reading consists of a higher number over a lower number, such as 110/72. The higher number (systolic) is the pressure inside your arteries when your heart pumps. The lower number (diastolic) is the pressure inside your arteries when your heart relaxes. Ideally you want your blood pressure below 120/80. Hypertension forces your heart to work harder to pump blood. Your arteries may become narrow or stiff. Having untreated or uncontrolled hypertension can cause heart attack, stroke, kidney disease, and other problems. RISK FACTORS Some risk factors for high blood pressure are controllable. Others are not.  Risk factors you cannot control include:  4. Race. You may be at higher risk if you are African American. 5. Age. Risk increases with age. 6. Gender. Men are at higher risk than women before age 54 years. After age 38, women are at higher risk than men. Risk factors you can control include: 14. Not getting enough exercise or physical activity. 74. Being overweight. 16. Getting too much fat, sugar, calories, or salt in your diet. 17. Drinking too much alcohol. SIGNS AND SYMPTOMS Hypertension does not usually cause  signs or symptoms. Extremely high blood pressure (hypertensive crisis) may cause headache, anxiety, shortness of breath, and nosebleed. DIAGNOSIS To check if you have hypertension, your health care provider will measure your blood pressure while you are seated, with your arm held at the level of your heart. It should be measured at least twice using the same arm. Certain conditions can cause a difference in blood pressure between your right and left arms. A blood pressure reading that is higher than normal on one occasion does not mean that you need treatment. If it is not clear whether you have high blood pressure, you may be asked to return on a different day to have your blood pressure checked again. Or, you may be asked to monitor your blood pressure at home for 1 or more weeks. TREATMENT Treating high blood pressure includes making lifestyle changes and possibly taking medicine. Living a healthy lifestyle can help lower high blood pressure. You may need to change some of your habits. Lifestyle changes may include: 8. Following the DASH diet. This diet is high in fruits, vegetables, and whole grains. It is low in salt, red meat, and added sugars. 9. Keep your sodium intake below 2,300 mg per day. 10. Getting at least 30-45 minutes of aerobic exercise at least 4 times per week. 31. Losing weight if necessary. 12. Not smoking. 13. Limiting alcoholic beverages. 14. Learning ways to reduce stress. Your health care provider may prescribe medicine if lifestyle changes are not enough to get your blood pressure under control, and if one of the following is true:  You are 35-59 years of age and your systolic blood pressure is above 140.  You are 44 years of age or older, and your systolic blood pressure is above 150.  Your diastolic blood pressure is above 90.  You have diabetes, and your systolic blood pressure is over 161 or your diastolic blood pressure is over 90.  You have kidney disease and  your blood pressure is above 140/90.  You have heart disease and your blood pressure is above 140/90. Your personal target blood pressure may vary depending on your medical conditions, your age, and other factors. HOME CARE INSTRUCTIONS  Have your blood pressure rechecked as directed by your health care provider.   Take medicines only as directed by your health care provider. Follow the directions carefully. Blood pressure medicines must be taken as prescribed. The medicine does not work as well when you skip doses. Skipping doses also puts you at risk for problems.  Do not smoke.   Monitor your blood pressure at home as directed by your health care provider. SEEK MEDICAL CARE IF:   You think you are having a reaction to medicines taken.  You have recurrent headaches or feel dizzy.  You have swelling in your ankles.  You have trouble with your vision. SEEK IMMEDIATE MEDICAL CARE IF:  You develop a severe headache or confusion.  You have unusual weakness, numbness, or feel faint.  You have severe chest or abdominal pain.  You vomit repeatedly.  You have trouble breathing. MAKE SURE YOU:   Understand these instructions.  Will watch your condition.  Will get help right away if you are not doing well or get worse.   This information is not intended to replace advice given to you by your health care provider. Make sure you discuss any questions you have with your health care provider.   Document Released: 09/09/2005 Document Revised: 01/24/2015 Document Reviewed: 07/02/2013 Elsevier Interactive  Patient Education 2016 [place] you healthy  Get these tests  Blood pressure- Have your blood pressure checked once a year by your healthcare provider.  Normal blood pressure is 120/80  Weight- Have your body mass index (BMI) calculated to screen for obesity.  BMI is a measure of body fat based on height and weight. You can also calculate your own BMI at  [employer].  Cholesterol- Have your cholesterol checked every year.  Diabetes- Have your blood sugar checked regularly if you have high blood pressure, high cholesterol, have a family history of diabetes or if you are overweight.  Screening for Colon Cancer- Colonoscopy starting at age 66.  Screening may begin sooner depending on your family history and other health conditions. Follow up colonoscopy as directed by your Gastroenterologist.  Screening for Prostate Cancer- Both blood work (PSA) and a rectal exam help screen for Prostate Cancer.  Screening begins at age 24 with African-American men and at age 59 with Caucasian men.  Screening may begin sooner depending on your family history.  Take these medicines  Aspirin- One aspirin daily can help prevent Heart disease and Stroke.  Flu shot- Every fall.  Tetanus- Every 10 years.  Zostavax- Once after the age of 69 to prevent Shingles.  Pneumonia shot- Once after the age of 23; if you are younger than 57, ask your healthcare provider if you need a Pneumonia shot.  Take these steps  Don't smoke- If you do smoke, talk to your doctor about quitting.  For tips on how to quit, go to www.smokefree.gov or call 1-800-QUIT-NOW.  Be physically active- Exercise 5 days a week for at least 30 minutes.  If you are not already physically active start slow and gradually work up to 30 minutes of moderate physical activity.  Examples of moderate activity include walking briskly, mowing the yard, dancing, swimming, bicycling, etc.  Eat a healthy diet- Eat a variety of healthy food such as fruits, vegetables, low fat milk, low fat cheese, yogurt, lean meant, poultry, fish, beans, tofu, etc. For more information go to www.thenutritionsource.org  Drink alcohol in moderation- Limit alcohol intake to less than two drinks a day. Never drink and drive.  Dentist- Brush and floss twice daily; visit your dentist twice a year.  Depression- Your  emotional health is as important as your physical health. If you're feeling down, or losing interest in things you would normally enjoy please talk to your healthcare provider.  Eye exam- Visit your eye doctor every year.  Safe sex- If you may be exposed to a sexually transmitted infection, use a condom.  Seat belts- Seat belts can save your life; always wear one.  Smoke/Carbon Monoxide detectors- These detectors need to be installed on the appropriate level of your home.  Replace batteries at least once a year.  Skin cancer- When out in the sun, cover up and use sunscreen 15 SPF or higher.  Violence- If anyone is threatening you, please tell your healthcare provider.  Living Will/ Health care power of attorney- Speak with your healthcare provider and family.      I personally performed the services described in this documentation, which was scribed in my presence. The recorded information has been reviewed and considered, and addended by me as needed.   By signing my name below, I, Raven Small, attest that this documentation has been prepared under the direction and in the presence of Brian Ray, MD.  Electronically Signed: Thea Higgins, ED Scribe. 07/17/2015. 10:26 AM.

## 2015-07-18 LAB — PSA: PSA: <0.01 ng/mL (ref <=4.00)

## 2015-10-09 ENCOUNTER — Ambulatory Visit: Payer: BLUE CROSS/BLUE SHIELD | Admitting: Family Medicine

## 2015-11-01 ENCOUNTER — Ambulatory Visit: Payer: BLUE CROSS/BLUE SHIELD | Admitting: Family Medicine

## 2015-11-08 ENCOUNTER — Ambulatory Visit (INDEPENDENT_AMBULATORY_CARE_PROVIDER_SITE_OTHER): Payer: Managed Care, Other (non HMO) | Admitting: Family Medicine

## 2015-11-08 ENCOUNTER — Encounter: Payer: Self-pay | Admitting: Family Medicine

## 2015-11-08 VITALS — BP 130/80 | HR 59 | Temp 97.6°F | Resp 16 | Ht 73.5 in | Wt 254.4 lb

## 2015-11-08 DIAGNOSIS — E781 Pure hyperglyceridemia: Secondary | ICD-10-CM

## 2015-11-08 DIAGNOSIS — E663 Overweight: Secondary | ICD-10-CM | POA: Diagnosis not present

## 2015-11-08 DIAGNOSIS — I10 Essential (primary) hypertension: Secondary | ICD-10-CM | POA: Diagnosis not present

## 2015-11-08 LAB — COMPLETE METABOLIC PANEL WITH GFR
ALT: 24 U/L (ref 9–46)
AST: 20 U/L (ref 10–35)
Albumin: 4.4 g/dL (ref 3.6–5.1)
Alkaline Phosphatase: 43 U/L (ref 40–115)
BUN: 23 mg/dL (ref 7–25)
CO2: 23 mmol/L (ref 20–31)
Calcium: 9.2 mg/dL (ref 8.6–10.3)
Chloride: 106 mmol/L (ref 98–110)
Creat: 0.88 mg/dL (ref 0.70–1.33)
GFR, Est African American: >89 mL/min (ref >=60)
GFR, Est Non African American: >89 mL/min (ref >=60)
Glucose, Bld: 95 mg/dL (ref 65–99)
Potassium: 4.2 mmol/L (ref 3.5–5.3)
Sodium: 140 mmol/L (ref 135–146)
Total Bilirubin: 0.8 mg/dL (ref 0.2–1.2)
Total Protein: 6.8 g/dL (ref 6.1–8.1)

## 2015-11-08 LAB — LIPID PANEL
Cholesterol: 167 mg/dL (ref 125–200)
HDL: 42 mg/dL (ref >=40)
LDL Cholesterol: 110 mg/dL (ref <130)
Total CHOL/HDL Ratio: 4 Ratio (ref <=5.0)
Triglycerides: 75 mg/dL (ref <150)
VLDL: 15 mg/dL (ref <30)

## 2015-11-08 MED ORDER — HYDROCHLOROTHIAZIDE 12.5 MG PO CAPS: 12.5000 mg | ORAL_CAPSULE | Freq: Every day | ORAL | Status: DC

## 2015-11-08 NOTE — Patient Instructions (Signed)
Keep up the good work with diet changes and weight loss! You should receive a call or letter about your lab results within the next week to 10 days.  No change in meds for now. I also suspect your cholesterol will improve with weight loss.  Follow up in 6 months. Sooner if having low blood pressures or new symptoms.

## 2015-11-08 NOTE — Progress Notes (Addendum)
Subjective:    Patient ID: Brian Higgins, male    DOB: December 28, 1955, 59 y.o.   MRN: [id number] By signing my name below, I, Zola Button, attest that this documentation has been prepared under the direction and in the presence of [name], MD.  Electronically Signed: Zola Button, Medical Scribe. 11/08/2015. 8:34 AM.  HPI HPI Comments: Abdou Dottery is a 60 y.o. male who presents to the [facility] for a follow-up. Patient is fasting today. He did receive the flu shot this year.  Hypertension: On HCTZ 12.5 mg qd. Last visit October 2016 for physical. Borderline elevated at that time, 146/85. Started HCTZ 12.5 mg qd. He has checked his blood pressure at home a few times with readings around 128-130/80-82. Patient denies any side effects, including chest pain, SOB, abdominal pain, and blood in stool. He exercises 5 days a week. He has been cutting back on carbohydrates in his diet. Lab Results  Component Value Date   CREATININE 0.76 08/11/2007    Wt Readings from Last 3 Encounters:  11/08/15 254 lb 6.4 oz (115.395 kg)  07/17/15 269 lb (122.018 kg)  05/22/15 274 lb (124.286 kg)      Patient Active Problem List   Diagnosis Date Noted  . Prostate cancer ([place]) 11/21/2012   Past Medical History  Diagnosis Date  . Allergy   . Prostate cancer ([place])   . Arthritis    Past Surgical History  Procedure Laterality Date  . Prostatectomy    . Rotator cuff repair Bilateral left 2007 and right 09/2009   No Known Allergies Prior to Admission medications   Medication Sig Start Date End Date Taking? Authorizing Provider  cetirizine (ZYRTEC) 10 MG tablet Take 10 mg by mouth daily.    Historical Provider, MD  hydrochlorothiazide (MICROZIDE) 12.5 MG capsule Take 1 capsule (12.5 mg total) by mouth daily. 07/17/15   Wendie Agreste, MD  Omega-3 Fatty Acids (FISH OIL) 1000 MG CAPS Take by mouth.    Historical Provider, MD   Social History   Social History  . Marital Status:  Unknown    Spouse Name: N/A  . Number of Children: N/A  . Years of Education: N/A   Occupational History  . Not on file.   Social History Main Topics  . Smoking status: Never Smoker   . Smokeless tobacco: Not on file  . Alcohol Use: 2.4 oz/week    4 Standard drinks or equivalent per week  . Drug Use: No  . Sexual Activity: Yes   Other Topics Concern  . Not on file   Social History Narrative     Review of Systems  Constitutional: Negative for fatigue and unexpected weight change.  Eyes: Negative for visual disturbance.  Respiratory: Negative for cough, chest tightness and shortness of breath.   Cardiovascular: Negative for chest pain, palpitations and leg swelling.  Gastrointestinal: Negative for abdominal pain and blood in stool.  Neurological: Negative for dizziness, light-headedness and headaches.       Objective:   Physical Exam  Constitutional: He is oriented to person, place, and time. He appears well-developed and well-nourished.  HENT:  Head: Normocephalic and atraumatic.  Eyes: EOM are normal. Pupils are equal, round, and reactive to light.  Neck: No JVD present. Carotid bruit is not present.  Cardiovascular: Normal rate, regular rhythm and normal heart sounds.   No murmur heard. Pulmonary/Chest: Effort normal and breath sounds normal. He has no rales.  Musculoskeletal: He exhibits no edema.  Neurological: He is alert and oriented to person, place, and time.  Skin: Skin is warm and dry.  Psychiatric: He has a normal mood and affect.  Vitals reviewed.   Filed Vitals:   11/08/15 0831  BP: 130/80  Pulse: 59  Temp: 97.6 F (36.4 C)  TempSrc: Oral  Resp: 16  Height: 6' 1.5" (1.867 m)  Weight: 254 lb 6.4 oz (115.395 kg)  SpO2: 95%         Assessment & Plan:   Laymond Tayman is a 60 y.o. male Essential hypertension - Plan: COMPLETE METABOLIC PANEL WITH GFR, hydrochlorothiazide (MICROZIDE) 12.5 MG capsule, DISCONTINUED: hydrochlorothiazide (MICROZIDE)  12.5 MG capsule  -stable. Labs pending, no change in meds for now.   -commended on weight loss and encouraged on continued efforts.   Hypertriglyceridemia - Plan: COMPLETE METABOLIC PANEL WITH GFR, Lipid panel  --should improve with weight loss. Continue to monitor off meds for now - labs pending.   Overweight -  Body mass index is 33.11 kg/(m^2).  -continue to work on diet changes, small amounts of complex carbs/fiber, portion control and keep exercising most days of the week.   Meds ordered this encounter  Medications  . DISCONTD: hydrochlorothiazide (MICROZIDE) 12.5 MG capsule    Sig: Take 1 capsule (12.5 mg total) by mouth daily.    Dispense:  30 capsule    Refill:  2  . hydrochlorothiazide (MICROZIDE) 12.5 MG capsule    Sig: Take 1 capsule (12.5 mg total) by mouth daily.    Dispense:  90 capsule    Refill:  1   Patient Instructions  Keep up the good work with diet changes and weight loss! You should receive a call or letter about your lab results within the next week to 10 days.  No change in meds for now. I also suspect your cholesterol will improve with weight loss.  Follow up in 6 months. Sooner if having low blood pressures or new symptoms.     I personally performed the services described in this documentation, which was scribed in my presence. The recorded information has been reviewed and considered, and addended by me as needed.

## 2015-11-21 ENCOUNTER — Other Ambulatory Visit: Payer: Self-pay | Admitting: Family Medicine

## 2015-11-25 ENCOUNTER — Encounter: Payer: Self-pay | Admitting: *Deleted

## 2016-05-08 ENCOUNTER — Ambulatory Visit: Payer: Managed Care, Other (non HMO) | Admitting: Family Medicine

## 2016-05-09 ENCOUNTER — Ambulatory Visit (INDEPENDENT_AMBULATORY_CARE_PROVIDER_SITE_OTHER): Payer: Managed Care, Other (non HMO) | Admitting: Family Medicine

## 2016-05-09 ENCOUNTER — Encounter: Payer: Self-pay | Admitting: Family Medicine

## 2016-05-09 VITALS — BP 117/65 | HR 61 | Temp 97.6°F | Resp 16 | Ht 74.0 in | Wt 255.4 lb

## 2016-05-09 DIAGNOSIS — M1712 Unilateral primary osteoarthritis, left knee: Secondary | ICD-10-CM

## 2016-05-09 DIAGNOSIS — I10 Essential (primary) hypertension: Secondary | ICD-10-CM | POA: Diagnosis not present

## 2016-05-09 LAB — BASIC METABOLIC PANEL
BUN: 22 mg/dL (ref 7–25)
CO2: 22 mmol/L (ref 20–31)
Calcium: 9.5 mg/dL (ref 8.6–10.3)
Chloride: 105 mmol/L (ref 98–110)
Creat: 0.9 mg/dL (ref 0.70–1.33)
Glucose, Bld: 93 mg/dL (ref 65–99)
Potassium: 4.1 mmol/L (ref 3.5–5.3)
Sodium: 139 mmol/L (ref 135–146)

## 2016-05-09 MED ORDER — HYDROCHLOROTHIAZIDE 12.5 MG PO CAPS: 12.5000 mg | ORAL_CAPSULE | Freq: Every day | ORAL | 1 refills | Status: DC

## 2016-05-09 NOTE — Progress Notes (Signed)
By signing my name below, I, Brian Higgins, attest that this documentation has been prepared under the direction and in the presence of Brian Ray, MD.  Electronically Signed: Verlee Higgins, Medical Scribe. 05/09/16. 9:47 AM.  Subjective:    Patient ID: Brian Higgins, male    DOB: 1956-07-13, 60 y.o.   MRN: [id number]  HPI Chief Complaint  Patient presents with  . Hypertension    follow up since starting BP Med    HPI Comments: Brian Higgins is a 60 y.o. male who presents to the [facility] for HTN follow-up. He was last seen Feb 15th; he was stable, losing weight, and tolerating medication. This was also expected to improve with weight loss.  Left Knee Pain: Pt plans on replacing his left knee since it's been bone on bone since 1980 and he's tried the cartilage injections, and ibuprofen without relief to his symptoms. Pt mentions his knee pain used to hurt in his joint, but the pain now radiates down his leg. Pt mentions he can only go up the steps one step at a time, and he ices his left knee for relief after he plays tennis. Pt mentions he can no longer play tennis 2 days in a row.  HTN: Pt mentions his blood pressure is doing good at home running in the low 120/high 80. Pt exercises 6 days a week for about 30 mins to 45 mins, and plays tennis 2 days a week. Pt took carbs out of his diet to lose weight, but he's slowly putting it back in his diet. Pt denies dizziness, lihgt-headedness, HA, abdominal pain, melena.   HLD: Lab Results  Component Value Date   ALT 24 11/08/2015   AST 20 11/08/2015   ALKPHOS 43 11/08/2015   BILITOT 0.8 11/08/2015   Lab Results  Component Value Date   CHOL 167 11/08/2015   HDL 42 11/08/2015   LDLCALC 110 11/08/2015   TRIG 75 11/08/2015   CHOLHDL 4.0 11/08/2015   Wt Readings from Last 3 Encounters:  05/09/16 255 lb 6.4 oz (115.8 kg)  11/08/15 254 lb 6.4 oz (115.4 kg)  07/17/15 269 lb (122 kg)   Pt's son is getting  married in September and plan on going to the beach after.   Patient Active Problem List   Diagnosis Date Noted  . Prostate cancer ([place]) 11/21/2012   Past Medical History:  Diagnosis Date  . Allergy   . Arthritis   . Prostate cancer [facility])    Past Surgical History:  Procedure Laterality Date  . PROSTATECTOMY    . ROTATOR CUFF REPAIR Bilateral left 2007 and right 09/2009   Not on File Prior to Admission medications   Medication Sig Start Date End Date Taking? Authorizing Provider  cetirizine (ZYRTEC) 10 MG tablet Take 10 mg by mouth daily.   Yes Historical Provider, MD  hydrochlorothiazide (MICROZIDE) 12.5 MG capsule TAKE 1 CAPSULE (12.5 MG TOTAL) BY MOUTH DAILY. 11/22/15  Yes Wendie Agreste, MD  Omega-3 Fatty Acids (FISH OIL) 1000 MG CAPS Take by mouth.   Yes Historical Provider, MD   Social History   Social History  . Marital status: Unknown    Spouse name: N/A  . Number of children: N/A  . Years of education: N/A   Occupational History  . Not on file.   Social History Main Topics  . Smoking status: Never Smoker  . Smokeless tobacco: Not on file  . Alcohol use 2.4 oz/week  4 Standard drinks or equivalent per week  . Drug use: No  . Sexual activity: Yes   Other Topics Concern  . Not on file   Social History Narrative  . No narrative on file   Depression screen [facility] 2/9 05/09/2016 11/08/2015 07/17/2015 05/22/2015  Decreased Interest 0 0 0 0  Down, Depressed, Hopeless 0 0 0 0  PHQ - 2 Score 0 0 0 0   Review of Systems  Constitutional: Negative for fatigue and unexpected weight change.  Eyes: Negative for visual disturbance.  Respiratory: Negative for cough, chest tightness and shortness of breath.   Cardiovascular: Negative for chest pain, palpitations and leg swelling.  Gastrointestinal: Negative for abdominal pain and blood in stool.  Musculoskeletal: Positive for arthralgias.  Neurological: Negative for dizziness, light-headedness and headaches.    Objective:  Physical Exam  Constitutional: He is oriented to person, place, and time. He appears well-developed and well-nourished.  HENT:  Head: Normocephalic and atraumatic.  Eyes: EOM are normal. Pupils are equal, round, and reactive to light.  Neck: No JVD present. Carotid bruit is not present.  Cardiovascular: Normal rate, regular rhythm and normal heart sounds.   No murmur heard. Pulmonary/Chest: Effort normal and breath sounds normal. No respiratory distress. He has no wheezes. He has no rales.  Musculoskeletal: He exhibits no edema.  Left knee FROM but crepitus With medial joint line tenderness No significant effusion  Neurological: He is alert and oriented to person, place, and time.  Skin: Skin is warm and dry.  Psychiatric: He has a normal mood and affect.  Vitals reviewed.  BP 117/65 (BP Location: Left Arm, Patient Position: Sitting, Cuff Size: Large)   Pulse 61   Temp 97.6 F (36.4 C) (Oral)   Resp 16   Ht 6\' 2"  (1.88 m)   Wt 255 lb 6.4 oz (115.8 kg)   SpO2 98%   BMI 32.79 kg/m  Assessment & Plan:   Brian Higgins is a 60 y.o. male Essential hypertension - Plan: Basic metabolic panel  -Stable. No medication changes for now. Minimize NSAIDs. BMP pending.  Primary osteoarthritis of left knee  -Plan to discuss options with orthopedics. Advised let me know where he would like to be referred. Tylenol for now safer than NSAID.  Meds ordered this encounter  Medications  . hydrochlorothiazide (MICROZIDE) 12.5 MG capsule    Sig: Take 1 capsule (12.5 mg total) by mouth daily.    Dispense:  90 capsule    Refill:  1   Patient Instructions    Let me know if you would like me to refer you to ortho for your knee.  No change in meds for now. Minimize NSAID (alleve or ibuprofen) use - tylenol is safer.   If you want to try to come off BP med, check it everyday off meds and if running over 140/90 - restart med each day.   Follow up in 6 months for physical.    IF  you received an x-Higgins today, you will receive an invoice from [facility]. Please contact [facility] at [phone] with questions or concerns regarding your invoice.   IF you received labwork today, you will receive an invoice from [employer]. Please contact [facility] at [phone] with questions or concerns regarding your invoice.   Our billing staff will not be able to assist you with questions regarding bills from these companies.  You will be contacted with the lab results as soon as they are available. The fastest  way to get your results is to activate your My Chart account. Instructions are located on the last page of this paperwork. If you have not heard from [place] regarding the results in 2 weeks, please contact this office.       I personally performed the services described in this documentation, which was scribed in my presence. The recorded information has been reviewed and considered, and addended by me as needed.   Signed,   Brian Ray, MD [facility].  05/10/16 1:45 PM

## 2016-05-09 NOTE — Patient Instructions (Addendum)
  Let me know if you would like me to refer you to ortho for your knee.  No change in meds for now. Minimize NSAID (alleve or ibuprofen) use - tylenol is safer.   If you want to try to come off BP med, check it everyday off meds and if running over 140/90 - restart med each day.   Follow up in 6 months for physical.    IF you received an x-ray today, you will receive an invoice from [facility]. Please contact [facility] at [phone] with questions or concerns regarding your invoice.   IF you received labwork today, you will receive an invoice from [employer]. Please contact [facility] at [phone] with questions or concerns regarding your invoice.   Our billing staff will not be able to assist you with questions regarding bills from these companies.  You will be contacted with the lab results as soon as they are available. The fastest way to get your results is to activate your My Chart account. Instructions are located on the last page of this paperwork. If you have not heard from [place] regarding the results in 2 weeks, please contact this office.

## 2016-07-04 ENCOUNTER — Telehealth: Payer: Self-pay

## 2016-07-04 NOTE — Telephone Encounter (Signed)
Pt is needing a refill on his blood pressure meds   Best number [phone]

## 2016-07-04 NOTE — Telephone Encounter (Signed)
Voice mail left for pt telling him that he has refills on his blood pressure medicine.

## 2016-11-29 ENCOUNTER — Telehealth: Payer: Self-pay | Admitting: Family Medicine

## 2016-11-29 NOTE — Telephone Encounter (Signed)
LMOM to call back to set that appointment up

## 2016-11-29 NOTE — Telephone Encounter (Signed)
I received a form to clear him for knee surgery. Please have him follow-up for appointment within the next 2 weeks if possible to discuss surgical clearance, and can also discuss medication refill and lab follow-up at that time as last office visit with me was in August.

## 2016-12-06 ENCOUNTER — Encounter (HOSPITAL_COMMUNITY): Payer: Self-pay | Admitting: *Deleted

## 2016-12-17 ENCOUNTER — Other Ambulatory Visit: Payer: Self-pay | Admitting: Orthopedic Surgery

## 2016-12-19 ENCOUNTER — Ambulatory Visit: Payer: Self-pay | Admitting: Orthopedic Surgery

## 2017-01-02 NOTE — Patient Instructions (Addendum)
Brian Higgins  01/02/2017   Your procedure is scheduled on: 01/13/17  Report to [facility] Main  Entrance                  Report to admitting at    2:50  PM   Call this number if you have problems the morning of surgery    [phone]   Remember: ONLY 1 PERSON MAY GO WITH YOU TO SHORT STAY TO GET  READY MORNING OF [place].   Do not  EAT FOOD :After Midnight.   You may have clear liquids from midnight till 0850 am the [place] Allowed                                                                     Foods Excluded  Coffee and tea, regular and decaf                             liquids that you cannot  Plain Jell-O in any flavor                                             see through such as: Fruit ices (not with fruit pulp)                                     milk, soups, orange juice  Iced Popsicles                                    All solid food Carbonated beverages, regular and diet                                    Cranberry, grape and apple juices Sports drinks like Gatorade Lightly seasoned clear broth or consume(fat free) Sugar, honey syrup  Sample Menu Breakfast                                Lunch                                     Supper Cranberry juice                    Beef broth                            Chicken broth Jell-O  Grape juice                           Apple juice Coffee or tea                        Jell-O                                      Popsicle                                                Coffee or tea                        Coffee or tea  _____________________________________________________________________     Take these medicines the morning of surgery with A SIP OF WATER: zyrtec and eye drops if needed                                You may not have any metal on your body including hair pins and              piercings  Do  not wear jewelry,lotions, powders or perfumes, deodorant                         Men may shave face and neck.   Do not bring valuables to the hospital. [name].  Contacts, dentures or bridgework may not be worn into surgery.  Leave suitcase in the car. After surgery it may be brought to your room.                 Please read over the following fact sheets you were given: _____________________________________________________________________           [facility] - Preparing for Surgery Before surgery, you can play an important role.  Because skin is not sterile, your skin needs to be as free of germs as possible.  You can reduce the number of germs on your skin by washing with CHG (chlorahexidine gluconate) soap before surgery.  CHG is an antiseptic cleaner which kills germs and bonds with the skin to continue killing germs even after washing. Please DO NOT use if you have an allergy to CHG or antibacterial soaps.  If your skin becomes reddened/irritated stop using the CHG and inform your nurse when you arrive at Short Stay. Do not shave (including legs and underarms) for at least 48 hours prior to the first CHG shower.  You may shave your face/neck. Please follow these instructions carefully:  1.  Shower with CHG Soap the night before surgery and the  morning of Surgery.  2.  If you choose to wash your hair, wash your hair first as usual with your  normal  shampoo.  3.  After you shampoo, rinse your hair and body thoroughly to remove the  shampoo.  4.  Use CHG as you would any other liquid soap.  You can apply chg directly  to the skin and wash                       Gently with a scrungie or clean washcloth.  5.  Apply the CHG Soap to your body ONLY FROM THE NECK DOWN.   Do not use on face/ open                           Wound or open sores. Avoid contact with eyes, ears mouth and genitals (private parts).                        Wash face,  Genitals (private parts) with your normal soap.             6.  Wash thoroughly, paying special attention to the area where your surgery  will be performed.  7.  Thoroughly rinse your body with warm water from the neck down.  8.  DO NOT shower/wash with your normal soap after using and rinsing off  the CHG Soap.                9.  Pat yourself dry with a clean towel.            10.  Wear clean pajamas.            11.  Place clean sheets on your bed the night of your first shower and do not  sleep with pets. Day of Surgery : Do not apply any lotions/deodorants the morning of surgery.  Please wear clean clothes to the hospital/surgery center.  FAILURE TO FOLLOW THESE INSTRUCTIONS MAY RESULT IN THE CANCELLATION OF YOUR SURGERY PATIENT SIGNATURE_________________________________  NURSE SIGNATURE__________________________________  ________________________________________________________________________  WHAT IS A BLOOD TRANSFUSION? Blood Transfusion Information  A transfusion is the replacement of blood or some of its parts. Blood is made up of multiple cells which provide different functions.  Red blood cells carry oxygen and are used for blood loss replacement.  White blood cells fight against infection.  Platelets control bleeding.  Plasma helps clot blood.  Other blood products are available for specialized needs, such as hemophilia or other clotting disorders. BEFORE THE TRANSFUSION  Who gives blood for transfusions?   Healthy volunteers who are fully evaluated to make sure their blood is safe. This is blood bank blood. Transfusion therapy is the safest it has ever been in the practice of medicine. Before blood is taken from a donor, a complete history is taken to make sure that person has no history of diseases nor engages in risky social behavior (examples are intravenous drug use or sexual activity with multiple partners). The donor's travel history is  screened to minimize risk of transmitting infections, such as malaria. The donated blood is tested for signs of infectious diseases, such as HIV and hepatitis. The blood is then tested to be sure it is compatible with you in order to minimize the chance of a transfusion reaction. If you or a relative donates blood, this is often done in anticipation of surgery and is not appropriate for emergency situations. It takes many days to process the donated blood. RISKS AND COMPLICATIONS Although transfusion therapy is very safe and saves many lives, the main dangers of transfusion include:   Getting an infectious disease.  Developing a transfusion reaction.  This is an allergic reaction to something in the blood you were given. Every precaution is taken to prevent this. The decision to have a blood transfusion has been considered carefully by your caregiver before blood is given. Blood is not given unless the benefits outweigh the risks. AFTER THE TRANSFUSION  Right after receiving a blood transfusion, you will usually feel much better and more energetic. This is especially true if your red blood cells have gotten low (anemic). The transfusion raises the level of the red blood cells which carry oxygen, and this usually causes an energy increase.  The nurse administering the transfusion will monitor you carefully for complications. HOME CARE INSTRUCTIONS  No special instructions are needed after a transfusion. You may find your energy is better. Speak with your caregiver about any limitations on activity for underlying diseases you may have. SEEK MEDICAL CARE IF:   Your condition is not improving after your transfusion.  You develop redness or irritation at the intravenous (IV) site. SEEK IMMEDIATE MEDICAL CARE IF:  Any of the following symptoms occur over the next 12 hours:  Shaking chills.  You have a temperature by mouth above 102 F (38.9 C), not controlled by medicine.  Chest, back, or muscle  pain.  People around you feel you are not acting correctly or are confused.  Shortness of breath or difficulty breathing.  Dizziness and fainting.  You get a rash or develop hives.  You have a decrease in urine output.  Your urine turns a dark color or changes to pink, red, or brown. Any of the following symptoms occur over the next 10 days:  You have a temperature by mouth above 102 F (38.9 C), not controlled by medicine.  Shortness of breath.  Weakness after normal activity.  The white part of the eye turns yellow (jaundice).  You have a decrease in the amount of urine or are urinating less often.  Your urine turns a dark color or changes to pink, red, or brown. Document Released: 09/06/2000 Document Revised: 12/02/2011 Document Reviewed: 04/25/2008 ExitCare Patient Information 2014 [place].  _______________________________________________________________________  Incentive Spirometer  An incentive spirometer is a tool that can help keep your lungs clear and active. This tool measures how well you are filling your lungs with each breath. Taking long deep breaths may help reverse or decrease the chance of developing breathing (pulmonary) problems (especially infection) following:  A long period of time when you are unable to move or be active. BEFORE THE PROCEDURE   If the spirometer includes an indicator to show your best effort, your nurse or respiratory therapist will set it to a desired goal.  If possible, sit up straight or lean slightly forward. Try not to slouch.  Hold the incentive spirometer in an upright position. INSTRUCTIONS FOR USE  1. Sit on the edge of your bed if possible, or sit up as far as you can in bed or on a chair. 2. Hold the incentive spirometer in an upright position. 3. Breathe out normally. 4. Place the mouthpiece in your mouth and seal your lips tightly around it. 5. Breathe in slowly and as deeply as possible, raising the piston  or the ball toward the top of the column. 6. Hold your breath for 3-5 seconds or for as long as possible. Allow the piston or ball to fall to the bottom of the column. 7. Remove the mouthpiece from your mouth and breathe out normally. 8. Rest for a few seconds and repeat Steps  1 through 7 at least 10 times every 1-2 hours when you are awake. Take your time and take a few normal breaths between deep breaths. 9. The spirometer may include an indicator to show your best effort. Use the indicator as a goal to work toward during each repetition. 10. After each set of 10 deep breaths, practice coughing to be sure your lungs are clear. If you have an incision (the cut made at the time of surgery), support your incision when coughing by placing a pillow or rolled up towels firmly against it. Once you are able to get out of bed, walk around indoors and cough well. You may stop using the incentive spirometer when instructed by your caregiver.  RISKS AND COMPLICATIONS  Take your time so you do not get dizzy or light-headed.  If you are in pain, you may need to take or ask for pain medication before doing incentive spirometry. It is harder to take a deep breath if you are having pain. AFTER USE  Rest and breathe slowly and easily.  It can be helpful to keep track of a log of your progress. Your caregiver can provide you with a simple table to help with this. If you are using the spirometer at home, follow these instructions: [place] IF:   You are having difficultly using the spirometer.  You have trouble using the spirometer as often as instructed.  Your pain medication is not giving enough relief while using the spirometer.  You develop fever of 100.5 F (38.1 C) or higher. SEEK IMMEDIATE MEDICAL CARE IF:   You cough up bloody sputum that had not been present before.  You develop fever of 102 F (38.9 C) or greater.  You develop worsening pain at or near the incision site. MAKE  SURE YOU:   Understand these instructions.  Will watch your condition.  Will get help right away if you are not doing well or get worse. Document Released: 01/20/2007 Document Revised: 12/02/2011 Document Reviewed: 03/23/2007 [facility] Patient Information 2014 [place], [place].   ________________________________________________________________________

## 2017-01-03 ENCOUNTER — Other Ambulatory Visit: Payer: Self-pay | Admitting: Orthopedic Surgery

## 2017-01-03 ENCOUNTER — Ambulatory Visit: Payer: Self-pay | Admitting: Orthopedic Surgery
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
## 2017-01-06 ENCOUNTER — Encounter (HOSPITAL_COMMUNITY): Payer: Self-pay

## 2017-01-06 ENCOUNTER — Encounter (HOSPITAL_COMMUNITY)
Admission: RE | Admit: 2017-01-06 | Discharge: 2017-01-06 | Disposition: A | Discharge status: Home / Self Care | Payer: Managed Care, Other (non HMO) | Source: Ambulatory Visit | Attending: Orthopedic Surgery | Admitting: Orthopedic Surgery

## 2017-01-06 DIAGNOSIS — Z01812 Encounter for preprocedural laboratory examination: Secondary | ICD-10-CM | POA: Diagnosis not present

## 2017-01-06 DIAGNOSIS — Z01810 Encounter for preprocedural cardiovascular examination: Secondary | ICD-10-CM | POA: Insufficient documentation

## 2017-01-06 HISTORY — DX: Gastro-esophageal reflux disease without esophagitis: K21.9

## 2017-01-06 HISTORY — DX: Essential (primary) hypertension: I10

## 2017-01-06 LAB — CBC
HCT: 41 % (ref 39.0–52.0)
Hemoglobin: 13.9 g/dL (ref 13.0–17.0)
MCH: 30.8 pg (ref 26.0–34.0)
MCHC: 33.9 g/dL (ref 30.0–36.0)
MCV: 90.9 fL (ref 78.0–100.0)
Platelets: 174 10*3/uL (ref 150–400)
RBC: 4.51 MIL/uL (ref 4.22–5.81)
RDW: 14.1 % (ref 11.5–15.5)
WBC: 4.8 10*3/uL (ref 4.0–10.5)

## 2017-01-06 LAB — PROTIME-INR
INR: 0.95
Prothrombin Time: 12.7 seconds (ref 11.4–15.2)

## 2017-01-06 LAB — COMPREHENSIVE METABOLIC PANEL
ALT: 22 U/L (ref 17–63)
AST: 20 U/L (ref 15–41)
Albumin: 4.5 g/dL (ref 3.5–5.0)
Alkaline Phosphatase: 41 U/L (ref 38–126)
Anion gap: 8 (ref 5–15)
BUN: 18 mg/dL (ref 6–20)
CO2: 26 mmol/L (ref 22–32)
Calcium: 9 mg/dL (ref 8.9–10.3)
Chloride: 107 mmol/L (ref 101–111)
Creatinine, Ser: 0.87 mg/dL (ref 0.61–1.24)
GFR calc Af Amer: >60 mL/min (ref >60)
GFR calc non Af Amer: >60 mL/min (ref >60)
Glucose, Bld: 94 mg/dL (ref 65–99)
Potassium: 4 mmol/L (ref 3.5–5.1)
Sodium: 141 mmol/L (ref 135–145)
Total Bilirubin: 1 mg/dL (ref 0.3–1.2)
Total Protein: 7.3 g/dL (ref 6.5–8.1)

## 2017-01-06 LAB — APTT: aPTT: 28 seconds (ref 24–36)

## 2017-01-06 LAB — SURGICAL PCR SCREEN
MRSA, PCR: NEGATIVE
Staphylococcus aureus: NEGATIVE

## 2017-01-06 NOTE — Progress Notes (Signed)
lov note Dr. Carlota Raspberry 05/09/16

## 2017-01-13 ENCOUNTER — Inpatient Hospital Stay (HOSPITAL_COMMUNITY): Payer: Managed Care, Other (non HMO) | Admitting: Anesthesiology

## 2017-01-13 ENCOUNTER — Encounter (HOSPITAL_COMMUNITY): Payer: Self-pay | Admitting: *Deleted

## 2017-01-13 ENCOUNTER — Inpatient Hospital Stay (HOSPITAL_COMMUNITY)
Admission: RE | Admit: 2017-01-13 | Discharge: 2017-01-15 | DRG: 470 | Disposition: A | Discharge status: Home / Self Care | Payer: Managed Care, Other (non HMO) | Source: Ambulatory Visit | Attending: Orthopedic Surgery | Admitting: Orthopedic Surgery

## 2017-01-13 ENCOUNTER — Encounter (HOSPITAL_COMMUNITY)
Admission: RE | Disposition: A | Discharge status: Home / Self Care | Payer: Self-pay | Source: Ambulatory Visit | Attending: Orthopedic Surgery

## 2017-01-13 DIAGNOSIS — I10 Essential (primary) hypertension: Secondary | ICD-10-CM | POA: Diagnosis present

## 2017-01-13 DIAGNOSIS — Z91048 Other nonmedicinal substance allergy status: Secondary | ICD-10-CM | POA: Diagnosis not present

## 2017-01-13 DIAGNOSIS — M1712 Unilateral primary osteoarthritis, left knee: Secondary | ICD-10-CM | POA: Diagnosis present

## 2017-01-13 DIAGNOSIS — Z79899 Other long term (current) drug therapy: Secondary | ICD-10-CM

## 2017-01-13 DIAGNOSIS — M1710 Unilateral primary osteoarthritis, unspecified knee: Secondary | ICD-10-CM | POA: Diagnosis present

## 2017-01-13 DIAGNOSIS — M179 Osteoarthritis of knee, unspecified: Secondary | ICD-10-CM

## 2017-01-13 HISTORY — PX: TOTAL KNEE ARTHROPLASTY: SHX125

## 2017-01-13 SURGERY — ARTHROPLASTY, KNEE, TOTAL
Anesthesia: Spinal | Site: Knee | Laterality: Left

## 2017-01-13 MED ORDER — ROPIVACAINE HCL 5 MG/ML IJ SOLN
INTRAMUSCULAR | Status: DC | PRN
  Administered 2017-01-13: 30 mL via PERINEURAL

## 2017-01-13 MED ORDER — DIPHENHYDRAMINE HCL 12.5 MG/5ML PO ELIX
12.5000 mg | ORAL_SOLUTION | ORAL | Status: DC | PRN
  Administered 2017-01-14: 01:00:00 25 mg via ORAL
  Filled 2017-01-13: qty 10

## 2017-01-13 MED ORDER — FENTANYL CITRATE (PF) 100 MCG/2ML IJ SOLN
100.0000 ug | Freq: Once | INTRAMUSCULAR | Status: AC
  Administered 2017-01-13: 50 ug via INTRAVENOUS

## 2017-01-13 MED ORDER — GABAPENTIN 300 MG PO CAPS
300.0000 mg | ORAL_CAPSULE | Freq: Once | ORAL | Status: AC
  Administered 2017-01-13: 300 mg via ORAL

## 2017-01-13 MED ORDER — BISACODYL 10 MG RE SUPP: 10.0000 mg | Freq: Every day | RECTAL | Status: DC | PRN

## 2017-01-13 MED ORDER — MIDAZOLAM HCL 5 MG/5ML IJ SOLN
INTRAMUSCULAR | Status: DC | PRN
  Administered 2017-01-13: 2 mg via INTRAVENOUS

## 2017-01-13 MED ORDER — DEXAMETHASONE SODIUM PHOSPHATE 10 MG/ML IJ SOLN
10.0000 mg | Freq: Once | INTRAMUSCULAR | Status: AC
  Administered 2017-01-13: 10 mg via INTRAVENOUS

## 2017-01-13 MED ORDER — CEFAZOLIN SODIUM-DEXTROSE 2-4 GM/100ML-% IV SOLN
INTRAVENOUS | Status: AC
  Filled 2017-01-13: qty 100

## 2017-01-13 MED ORDER — GABAPENTIN 300 MG PO CAPS
ORAL_CAPSULE | ORAL | Status: AC
  Filled 2017-01-13: qty 1

## 2017-01-13 MED ORDER — PHENOL 1.4 % MT LIQD
1.0000 | OROMUCOSAL | Status: DC | PRN
Start: 2017-01-13 — End: 2017-01-15
  Filled 2017-01-13: qty 177

## 2017-01-13 MED ORDER — SODIUM CHLORIDE 0.9 % IR SOLN
Status: DC | PRN
  Administered 2017-01-13: 1000 mL

## 2017-01-13 MED ORDER — TRANEXAMIC ACID 1000 MG/10ML IV SOLN
INTRAVENOUS | Status: DC | PRN
  Administered 2017-01-13: 2000 mg via TOPICAL

## 2017-01-13 MED ORDER — LACTATED RINGERS IV SOLN
INTRAVENOUS | Status: DC
  Administered 2017-01-13 (×3): via INTRAVENOUS

## 2017-01-13 MED ORDER — METOCLOPRAMIDE HCL 5 MG/ML IJ SOLN: 5.0000 mg | Freq: Three times a day (TID) | INTRAMUSCULAR | Status: DC | PRN

## 2017-01-13 MED ORDER — SODIUM CHLORIDE 0.9 % IV SOLN: INTRAVENOUS | Status: DC

## 2017-01-13 MED ORDER — BUPIVACAINE LIPOSOME 1.3 % IJ SUSP
20.0000 mL | Freq: Once | INTRAMUSCULAR | Status: DC
  Filled 2017-01-13: qty 20

## 2017-01-13 MED ORDER — PROPOFOL 10 MG/ML IV BOLUS
INTRAVENOUS | Status: AC
  Filled 2017-01-13: qty 20

## 2017-01-13 MED ORDER — ONDANSETRON HCL 4 MG/2ML IJ SOLN
INTRAMUSCULAR | Status: DC | PRN
  Administered 2017-01-13: 4 mg via INTRAVENOUS

## 2017-01-13 MED ORDER — TRANEXAMIC ACID 1000 MG/10ML IV SOLN
2000.0000 mg | Freq: Once | INTRAVENOUS | Status: DC
  Filled 2017-01-13: qty 20

## 2017-01-13 MED ORDER — ONDANSETRON HCL 4 MG/2ML IJ SOLN
INTRAMUSCULAR | Status: AC
  Filled 2017-01-13: qty 2

## 2017-01-13 MED ORDER — BUPIVACAINE HCL (PF) 0.5 % IJ SOLN
INTRAMUSCULAR | Status: DC | PRN
  Administered 2017-01-13: 3 mL

## 2017-01-13 MED ORDER — CEFAZOLIN SODIUM-DEXTROSE 2-4 GM/100ML-% IV SOLN
2.0000 g | Freq: Four times a day (QID) | INTRAVENOUS | Status: AC
  Administered 2017-01-13 – 2017-01-14 (×2): 2 g via INTRAVENOUS
  Filled 2017-01-13 (×2): qty 100

## 2017-01-13 MED ORDER — POLYETHYLENE GLYCOL 3350 17 G PO PACK: 17.0000 g | PACK | Freq: Every day | ORAL | Status: DC | PRN

## 2017-01-13 MED ORDER — RIVAROXABAN 10 MG PO TABS
10.0000 mg | ORAL_TABLET | Freq: Every day | ORAL | Status: DC
  Administered 2017-01-14 – 2017-01-15 (×2): 10 mg via ORAL
  Filled 2017-01-13 (×2): qty 1

## 2017-01-13 MED ORDER — METHOCARBAMOL 500 MG PO TABS
500.0000 mg | ORAL_TABLET | Freq: Four times a day (QID) | ORAL | Status: DC | PRN
  Administered 2017-01-13 – 2017-01-15 (×5): 500 mg via ORAL
  Filled 2017-01-13 (×6): qty 1

## 2017-01-13 MED ORDER — ACETAMINOPHEN 650 MG RE SUPP: 650.0000 mg | Freq: Four times a day (QID) | RECTAL | Status: DC | PRN

## 2017-01-13 MED ORDER — ONDANSETRON HCL 4 MG PO TABS: 4.0000 mg | ORAL_TABLET | Freq: Four times a day (QID) | ORAL | Status: DC | PRN

## 2017-01-13 MED ORDER — BUPIVACAINE LIPOSOME 1.3 % IJ SUSP
20.0000 mL | Freq: Once | INTRAMUSCULAR | Status: AC
  Administered 2017-01-13: 20 mL
  Filled 2017-01-13: qty 20

## 2017-01-13 MED ORDER — ACETAMINOPHEN 10 MG/ML IV SOLN
INTRAVENOUS | Status: AC
  Filled 2017-01-13: qty 100

## 2017-01-13 MED ORDER — FLEET ENEMA 7-19 GM/118ML RE ENEM: 1.0000 | ENEMA | Freq: Once | RECTAL | Status: DC | PRN

## 2017-01-13 MED ORDER — DOCUSATE SODIUM 100 MG PO CAPS
100.0000 mg | ORAL_CAPSULE | Freq: Two times a day (BID) | ORAL | Status: DC
  Administered 2017-01-13 – 2017-01-15 (×4): 100 mg via ORAL
  Filled 2017-01-13 (×4): qty 1

## 2017-01-13 MED ORDER — DEXAMETHASONE SODIUM PHOSPHATE 10 MG/ML IJ SOLN
INTRAMUSCULAR | Status: AC
  Filled 2017-01-13: qty 1

## 2017-01-13 MED ORDER — MENTHOL 3 MG MT LOZG: 1.0000 | LOZENGE | OROMUCOSAL | Status: DC | PRN

## 2017-01-13 MED ORDER — CEFAZOLIN SODIUM-DEXTROSE 2-4 GM/100ML-% IV SOLN
2.0000 g | INTRAVENOUS | Status: AC
  Administered 2017-01-13: 2 g via INTRAVENOUS

## 2017-01-13 MED ORDER — METOCLOPRAMIDE HCL 5 MG PO TABS: 5.0000 mg | ORAL_TABLET | Freq: Three times a day (TID) | ORAL | Status: DC | PRN

## 2017-01-13 MED ORDER — MIDAZOLAM HCL 2 MG/2ML IJ SOLN
INTRAMUSCULAR | Status: AC
  Filled 2017-01-13: qty 2

## 2017-01-13 MED ORDER — SODIUM CHLORIDE 0.9 % IJ SOLN
INTRAMUSCULAR | Status: AC
  Filled 2017-01-13: qty 50

## 2017-01-13 MED ORDER — FENTANYL CITRATE (PF) 100 MCG/2ML IJ SOLN
INTRAMUSCULAR | Status: AC
  Filled 2017-01-13: qty 2

## 2017-01-13 MED ORDER — CHLORHEXIDINE GLUCONATE 4 % EX LIQD: 60.0000 mL | Freq: Once | CUTANEOUS | Status: DC

## 2017-01-13 MED ORDER — MIDAZOLAM HCL 2 MG/2ML IJ SOLN
2.0000 mg | Freq: Once | INTRAMUSCULAR | Status: AC
Start: 2017-01-13 — End: 2017-01-13
  Administered 2017-01-13: 2 mg via INTRAVENOUS

## 2017-01-13 MED ORDER — TRAMADOL HCL 50 MG PO TABS: 50.0000 mg | ORAL_TABLET | Freq: Four times a day (QID) | ORAL | Status: DC | PRN

## 2017-01-13 MED ORDER — OXYCODONE HCL 5 MG PO TABS
5.0000 mg | ORAL_TABLET | ORAL | Status: DC | PRN
  Administered 2017-01-13: 10 mg via ORAL
  Administered 2017-01-13: 5 mg via ORAL
  Administered 2017-01-14 – 2017-01-15 (×12): 10 mg via ORAL
  Filled 2017-01-13 (×4): qty 2
  Filled 2017-01-13: qty 1
  Filled 2017-01-13 (×9): qty 2

## 2017-01-13 MED ORDER — MORPHINE SULFATE (PF) 4 MG/ML IV SOLN
1.0000 mg | INTRAVENOUS | Status: DC | PRN
  Administered 2017-01-13 (×2): 1 mg via INTRAVENOUS
  Filled 2017-01-13 (×2): qty 1

## 2017-01-13 MED ORDER — LORATADINE 10 MG PO TABS: 10.0000 mg | ORAL_TABLET | Freq: Every day | ORAL | Status: DC | PRN

## 2017-01-13 MED ORDER — SODIUM CHLORIDE 0.9 % IJ SOLN
INTRAMUSCULAR | Status: DC | PRN
  Administered 2017-01-13: 30 mL

## 2017-01-13 MED ORDER — ACETAMINOPHEN 10 MG/ML IV SOLN
1000.0000 mg | Freq: Once | INTRAVENOUS | Status: AC
  Administered 2017-01-13: 1000 mg via INTRAVENOUS

## 2017-01-13 MED ORDER — DEXAMETHASONE SODIUM PHOSPHATE 10 MG/ML IJ SOLN
10.0000 mg | Freq: Once | INTRAMUSCULAR | Status: AC
  Administered 2017-01-14: 10 mg via INTRAVENOUS
  Filled 2017-01-13: qty 1

## 2017-01-13 MED ORDER — ACETAMINOPHEN 325 MG PO TABS: 650.0000 mg | ORAL_TABLET | Freq: Four times a day (QID) | ORAL | Status: DC | PRN

## 2017-01-13 MED ORDER — ONDANSETRON HCL 4 MG/2ML IJ SOLN: 4.0000 mg | Freq: Four times a day (QID) | INTRAMUSCULAR | Status: DC | PRN

## 2017-01-13 MED ORDER — ACETAMINOPHEN 500 MG PO TABS
1000.0000 mg | ORAL_TABLET | Freq: Four times a day (QID) | ORAL | Status: AC
  Administered 2017-01-13 – 2017-01-14 (×4): 1000 mg via ORAL
  Filled 2017-01-13 (×4): qty 2

## 2017-01-13 MED ORDER — HYDROCHLOROTHIAZIDE 12.5 MG PO CAPS
12.5000 mg | ORAL_CAPSULE | Freq: Every day | ORAL | Status: DC
  Administered 2017-01-15: 08:00:00 12.5 mg via ORAL
  Filled 2017-01-13 (×2): qty 1

## 2017-01-13 MED ORDER — PROPOFOL 10 MG/ML IV BOLUS
INTRAVENOUS | Status: AC
  Filled 2017-01-13: qty 40

## 2017-01-13 MED ORDER — PROPOFOL 500 MG/50ML IV EMUL
INTRAVENOUS | Status: DC | PRN
  Administered 2017-01-13: 75 ug/kg/min via INTRAVENOUS

## 2017-01-13 MED ORDER — METHOCARBAMOL 1000 MG/10ML IJ SOLN
500.0000 mg | Freq: Four times a day (QID) | INTRAVENOUS | Status: DC | PRN
  Filled 2017-01-13: qty 5

## 2017-01-13 MED ORDER — GABAPENTIN 300 MG PO CAPS
300.0000 mg | ORAL_CAPSULE | Freq: Three times a day (TID) | ORAL | Status: DC
  Administered 2017-01-13 – 2017-01-15 (×5): 300 mg via ORAL
  Filled 2017-01-13 (×5): qty 1

## 2017-01-13 SURGICAL SUPPLY — 50 items
BAG DECANTER FOR FLEXI CONT (MISCELLANEOUS) ×2 IMPLANT
BAG ZIPLOCK 12X15 (MISCELLANEOUS) ×2 IMPLANT
BANDAGE ACE 6X5 VEL STRL LF (GAUZE/BANDAGES/DRESSINGS) ×2 IMPLANT
BANDAGE ELASTIC 6 VELCRO ST LF (GAUZE/BANDAGES/DRESSINGS) ×2 IMPLANT
BLADE SAG 18X100X1.27 (BLADE) ×2 IMPLANT
BLADE SAW SGTL 11.0X1.19X90.0M (BLADE) ×2 IMPLANT
BOWL SMART MIX CTS (DISPOSABLE) ×2 IMPLANT
CAPT KNEE TOTAL 3 ATTUNE ×2 IMPLANT
CEMENT HV SMART SET (Cement) ×4 IMPLANT
COVER SURGICAL LIGHT HANDLE (MISCELLANEOUS) ×2 IMPLANT
CUFF TOURN SGL QUICK 34 (TOURNIQUET CUFF) ×1
CUFF TRNQT CYL 34X4X40X1 (TOURNIQUET CUFF) ×1 IMPLANT
DECANTER SPIKE VIAL GLASS SM (MISCELLANEOUS) ×2 IMPLANT
DRAPE U-SHAPE 47X51 STRL (DRAPES) ×2 IMPLANT
DRSG ADAPTIC 3X8 NADH LF (GAUZE/BANDAGES/DRESSINGS) ×2 IMPLANT
DRSG PAD ABDOMINAL 8X10 ST (GAUZE/BANDAGES/DRESSINGS) ×2 IMPLANT
DURAPREP 26ML APPLICATOR (WOUND CARE) ×2 IMPLANT
ELECT REM PT RETURN 15FT ADLT (MISCELLANEOUS) ×2 IMPLANT
EVACUATOR 1/8 PVC DRAIN (DRAIN) ×2 IMPLANT
GAUZE SPONGE 4X4 12PLY STRL (GAUZE/BANDAGES/DRESSINGS) ×2 IMPLANT
GLOVE BIO SURGEON STRL SZ7.5 (GLOVE) IMPLANT
GLOVE BIO SURGEON STRL SZ8 (GLOVE) ×2 IMPLANT
GLOVE BIOGEL PI IND STRL 6.5 (GLOVE) IMPLANT
GLOVE BIOGEL PI IND STRL 8 (GLOVE) ×1 IMPLANT
GLOVE BIOGEL PI INDICATOR 6.5 (GLOVE)
GLOVE BIOGEL PI INDICATOR 8 (GLOVE) ×1
GLOVE SURG SS PI 6.5 STRL IVOR (GLOVE) IMPLANT
GOWN STRL REUS W/TWL LRG LVL3 (GOWN DISPOSABLE) ×2 IMPLANT
GOWN STRL REUS W/TWL XL LVL3 (GOWN DISPOSABLE) IMPLANT
HANDPIECE INTERPULSE COAX TIP (DISPOSABLE) ×1
IMMOBILIZER KNEE 20 (SOFTGOODS) ×2
IMMOBILIZER KNEE 20 THIGH 36 (SOFTGOODS) ×1 IMPLANT
MANIFOLD NEPTUNE II (INSTRUMENTS) ×2 IMPLANT
NS IRRIG 1000ML POUR BTL (IV SOLUTION) ×2 IMPLANT
PACK TOTAL KNEE CUSTOM (KITS) ×2 IMPLANT
PAD ABD 8X10 STRL (GAUZE/BANDAGES/DRESSINGS) ×2 IMPLANT
PADDING CAST COTTON 6X4 STRL (CAST SUPPLIES) ×2 IMPLANT
POSITIONER SURGICAL ARM (MISCELLANEOUS) ×2 IMPLANT
SET HNDPC FAN SPRY TIP SCT (DISPOSABLE) ×1 IMPLANT
STRIP CLOSURE SKIN 1/2X4 (GAUZE/BANDAGES/DRESSINGS) ×2 IMPLANT
SUT MNCRL AB 4-0 PS2 18 (SUTURE) ×2 IMPLANT
SUT STRATAFIX 0 PDS 27 VIOLET (SUTURE) ×2
SUT VIC AB 2-0 CT1 27 (SUTURE) ×3
SUT VIC AB 2-0 CT1 TAPERPNT 27 (SUTURE) ×3 IMPLANT
SUTURE STRATFX 0 PDS 27 VIOLET (SUTURE) ×1 IMPLANT
SYR 50ML LL SCALE MARK (SYRINGE) IMPLANT
TRAY FOLEY W/METER SILVER 16FR (SET/KITS/TRAYS/PACK) ×2 IMPLANT
WATER STERILE IRR 1000ML POUR (IV SOLUTION) ×4 IMPLANT
WRAP KNEE MAXI GEL POST OP (GAUZE/BANDAGES/DRESSINGS) ×2 IMPLANT
YANKAUER SUCT BULB TIP 10FT TU (MISCELLANEOUS) ×2 IMPLANT

## 2017-01-13 NOTE — Interval H&P Note (Signed)
History and Physical Interval Note:  01/13/2017 1:52 PM  Brian Higgins  has presented today for surgery, with the diagnosis of left knee osteoarthritis  The various methods of treatment have been discussed with the patient and family. After consideration of risks, benefits and other options for treatment, the patient has consented to  Procedure(s): LEFT TOTAL KNEE ARTHROPLASTY (Left) as a surgical intervention .  The patient's history has been reviewed, patient examined, no change in status, stable for surgery.  I have reviewed the patient's chart and labs.  Questions were answered to the patient's satisfaction.     [name]

## 2017-01-13 NOTE — Op Note (Signed)
OPERATIVE REPORT-TOTAL KNEE ARTHROPLASTY   Pre-operative diagnosis- Osteoarthritis  Left knee(s)  Post-operative diagnosis- Osteoarthritis Left knee(s)  Procedure-  Left  Total Knee Arthroplasty  Surgeon- Dione Plover. Emony Dormer, MD  Assistant- Arlee Muslim, PA-C   Anesthesia-  Adductor canal block and spinal  EBL-* No blood loss amount entered *   Drains Hemovac  Tourniquet time-  Total Tourniquet Time Documented: Thigh (Left) - 45 minutes Total: Thigh (Left) - 45 minutes     Complications- None  Condition-PACU - hemodynamically stable.   Brief Clinical Note   Brian Higgins is a 61 y.o. year old male with end stage OA of his left knee with progressively worsening pain and dysfunction. He has constant pain, with activity and at rest and significant functional deficits with difficulties even with ADLs. He has had extensive non-op management including analgesics, injections of cortisone, and home exercise program, but remains in significant pain with significant dysfunction. Radiographs show bone on bone arthritis all 3 compartments. He presents now for left Total Knee Arthroplasty.     Procedure in detail---   The patient is brought into the operating room and positioned supine on the operating table. After successful administration of  Adductor canal block and spinal,   a tourniquet is placed high on the  Left thigh(s) and the lower extremity is prepped and draped in the usual sterile fashion. Time out is performed by the operating team and then the  Left lower extremity is wrapped in Esmarch, knee flexed and the tourniquet inflated to 300 mmHg.       A midline incision is made with a ten blade through the subcutaneous tissue to the level of the extensor mechanism. A fresh blade is used to make a medial parapatellar arthrotomy. Soft tissue over the proximal medial tibia is subperiosteally elevated to the joint line with a knife and into the semimembranosus bursa with a Cobb elevator.  Soft tissue over the proximal lateral tibia is elevated with attention being paid to avoiding the patellar tendon on the tibial tubercle. The patella is everted, knee flexed 90 degrees and the ACL and PCL are removed. Findings are bone on bone all 3 compartments with massive global osteophytes.        The drill is used to create a starting hole in the distal femur and the canal is thoroughly irrigated with sterile saline to remove the fatty contents. The 5 degree Left  valgus alignment guide is placed into the femoral canal and the distal femoral cutting block is pinned to remove 9 mm off the distal femur. Resection is made with an oscillating saw.      The tibia is subluxed forward and the menisci are removed. The extramedullary alignment guide is placed referencing proximally at the medial aspect of the tibial tubercle and distally along the second metatarsal axis and tibial crest. The block is pinned to remove 48mm off the more deficient medial  side. Resection is made with an oscillating saw. Size 9is the most appropriate size for the tibia and the proximal tibia is prepared with the modular drill and keel punch for that size.      The femoral sizing guide is placed and size 9 is most appropriate. Rotation is marked off the epicondylar axis and confirmed by creating a rectangular flexion gap at 90 degrees. The size 9 cutting block is pinned in this rotation and the anterior, posterior and chamfer cuts are made with the oscillating saw. The intercondylar block is then placed and that  cut is made.      Trial size 9 tibial component, trial size 9 posterior stabilized femur and a 8  mm posterior stabilized rotating platform insert trial is placed. Full extension is achieved with excellent varus/valgus and anterior/posterior balance throughout full range of motion. The patella is everted and thickness measured to be 27  mm. Free hand resection is taken to 15 mm, a 41 template is placed, lug holes are drilled,  trial patella is placed, and it tracks normally. Osteophytes are removed off the posterior femur with the trial in place. All trials are removed and the cut bone surfaces prepared with pulsatile lavage. Cement is mixed and once ready for implantation, the size 9 tibial implant, size  9 posterior stabilized femoral component, and the size 41 patella are cemented in place and the patella is held with the clamp. The trial insert is placed and the knee held in full extension. The Exparel (20 ml mixed with 30 ml saline) is injected into the extensor mechanism, posterior capsule, medial and lateral gutters and subcutaneous tissues.  All extruded cement is removed and once the cement is hard the permanent 8 mm posterior stabilized rotating platform insert is placed into the tibial tray.      The wound is copiously irrigated with saline solution and the extensor mechanism closed over a hemovac drain with #1 V-loc suture. The tourniquet is released for a total tourniquet time of 45  minutes. Flexion against gravity is 140 degrees and the patella tracks normally. Subcutaneous tissue is closed with 2.0 vicryl and subcuticular with running 4.0 Monocryl. The incision is cleaned and dried and steri-strips and a bulky sterile dressing are applied. The limb is placed into a knee immobilizer and the patient is awakened and transported to recovery in stable condition.      Please note that a surgical assistant was a medical necessity for this procedure in order to perform it in a safe and expeditious manner. Surgical assistant was necessary to retract the ligaments and vital neurovascular structures to prevent injury to them and also necessary for proper positioning of the limb to allow for anatomic placement of the prosthesis.   Dione Plover Korena Nass, MD    01/13/2017, 4:17 PM

## 2017-01-13 NOTE — Anesthesia Preprocedure Evaluation (Addendum)
Anesthesia Evaluation  Patient identified by MRN, date of birth, ID band Patient awake    Reviewed: Allergy & Precautions, NPO status , Patient's Chart, lab work & pertinent test results  Airway Mallampati: II  TM Distance: >3 FB Neck ROM: Full    Dental  (+) Teeth Intact, Dental Advisory Given   Pulmonary neg pulmonary ROS,    breath sounds clear to auscultation       Cardiovascular hypertension, Pt. on medications  Rhythm:Regular Rate:Normal     Neuro/Psych negative neurological ROS  negative psych ROS   GI/Hepatic Neg liver ROS, GERD  ,  Endo/Other  negative endocrine ROS  Renal/GU negative Renal ROS  negative genitourinary   Musculoskeletal  (+) Arthritis , Osteoarthritis,    Abdominal   Peds negative pediatric ROS (+)  Hematology negative hematology ROS (+)   Anesthesia Other Findings   Reproductive/Obstetrics negative OB ROS                            Lab Results  Component Value Date   WBC 4.8 01/06/2017   HGB 13.9 01/06/2017   HCT 41.0 01/06/2017   MCV 90.9 01/06/2017   PLT 174 01/06/2017   Lab Results  Component Value Date   INR 0.95 01/06/2017     Anesthesia Physical Anesthesia Plan  ASA: II  Anesthesia Plan: Spinal   Post-op Pain Management:  Regional for Post-op pain   Induction: Intravenous  Airway Management Planned: Natural Airway  Additional Equipment:   Intra-op Plan:   Post-operative Plan:   Informed Consent: I have reviewed the patients History and Physical, chart, labs and discussed the procedure including the risks, benefits and alternatives for the proposed anesthesia with the patient or authorized representative who has indicated his/her understanding and acceptance.     Plan Discussed with: CRNA  Anesthesia Plan Comments:         Anesthesia Quick Evaluation

## 2017-01-13 NOTE — Transfer of Care (Signed)
Immediate Anesthesia Transfer of Care Note  Patient: Brian Higgins  Procedure(s) Performed: Procedure(s): LEFT TOTAL KNEE ARTHROPLASTY (Left)  Patient Location: PACU  Anesthesia Type:Spinal  Level of Consciousness:  sedated, patient cooperative and responds to stimulation  Airway & Oxygen Therapy:Patient Spontanous Breathing and Patient connected to face mask oxgen  Post-op Assessment:  Report given to PACU RN and Post -op Vital signs reviewed and stable  Post vital signs:  Reviewed and stable  Last Vitals:  Vitals:   01/13/17 1401 01/13/17 1402  BP:    Pulse: (!) 59 62  Resp: 10 (!) 9  Temp:      Complications: No apparent anesthesia complications

## 2017-01-13 NOTE — Anesthesia Procedure Notes (Signed)
Spinal  Patient location during procedure: OR Start time: 01/13/2017 2:51 PM End time: 01/13/2017 2:56 PM Staffing Anesthesiologist: Suella Broad D Resident/CRNA: Chrystine Oiler G Performed: resident/CRNA  Preanesthetic Checklist Completed: patient identified, site marked, surgical consent, pre-op evaluation, timeout performed, IV checked, risks and benefits discussed and monitors and equipment checked Spinal Block Patient position: sitting Prep: DuraPrep Patient monitoring: heart rate, continuous pulse ox and blood pressure Approach: midline Location: L2-3 Injection technique: single-shot Needle Needle type: Sprotte  Needle gauge: 24 G Needle length: 9 cm Needle insertion depth: 7 cm Assessment Sensory level: T6 Additional Notes Kit expiration date checked and verified.  -heme, -paraesthesia,+CSF pre and post injection.  Patient tolerated well.

## 2017-01-13 NOTE — Anesthesia Procedure Notes (Signed)
Anesthesia Regional Block: Adductor canal block   Pre-Anesthetic Checklist: ,, timeout performed, Correct Patient, Correct Site, Correct Laterality, Correct Procedure, Correct Position, site marked, Risks and benefits discussed,  Surgical consent,  Pre-op evaluation,  At surgeon's request and post-op pain management  Laterality: Left  Prep: chloraprep       Needles:  Injection technique: Single-shot  Needle Type: Echogenic Needle     Needle Length: 9cm  Needle Gauge: 21     Additional Needles:   Procedures: ultrasound guided,,,,,,,,  Narrative:  Start time: 01/13/2017 1:55 PM End time: 01/13/2017 2:00 PM Injection made incrementally with aspirations every 5 mL.  Performed by: Personally  Anesthesiologist: Suella Broad D  Additional Notes: Tolerated well.

## 2017-01-13 NOTE — H&P (View-Only) (Signed)
Brian Higgins DOB: [birth year] Married / Language: English / Race: White Male Date of Admission:  01/13/2017 CC:  Left Knee Pain History of Present Illness  The patient is a 61 year old male who comes in for a preoperative History and Physical. The patient is scheduled for a left total knee arthroplasty to be performed by Dr. Dione Plover. [name], MD at [facility] on 01/13/17. The patient is a 61 year old male who presented with knee complaints. The patient reports left knee symptoms including: pain which began year(s) ago following a specific injury (dislocation 1980's playing basketball had sx. left knee has bother pt. ever since then.). The injury occurred while the patient was playing sports. The patient describes their pain as aching (constant) and throbbing (when playing tennis).The patient feels that the symptoms are worsening. The patient has the current diagnosis of knee osteoarthritis. This problem has not been previously evaluated. Past treatment for this problem has included intra-articular injection of corticosteroids (cortisone and visco years ago: did not help). Symptoms are reported to be located in the left knee and include knee pain. The patient reports that symptoms radiate to the left lower leg. Symptoms are exacerbated by motion at the knee, weight bearing and walking (stairs). Symptoms are relieved by rest. Current treatment includes application of ice and nonsteroidal anti-inflammatory drugs (Ibuprofen, Tylenol. shoe lift). He has had problems with this knee for almost 40 years now. He actually had a true knee dislocation while playing basketball. He was a Equities trader at [employer]. He was at [employer] and his leg got clipped and he had, what sounds like a posterior dislocation of the knee. He had it reduced and then had ligament reconstruction. Fortunately, he did not have any vascular or neurologic injury with the dislocation. He has pain with the left knee now at most times.  It has become much more limiting to him functionally in the past year. He has had to give up the elliptical and can barely bike. He wants to be more active. He has had cortisone and viscosupplements in the past without benefit. He is ready to get the knee fixed at this time. They have been treated conservatively in the past for the above stated problem and despite conservative measures, they continue to have progressive pain and severe functional limitations and dysfunction. They have failed non-operative management including home exercise, medications, and injections. It is felt that they would benefit from undergoing total joint replacement. Risks and benefits of the procedure have been discussed with the patient and they elect to proceed with surgery. There are no active contraindications to surgery such as ongoing infection or rapidly progressive neurological disease.  Problem List/Past Medical Elbow pain (M25.529)  Traumatic rupture of tendon of elbow or forearm (Z30.865H)  Primary osteoarthritis of left knee (M17.12)  Osteoarthritis of knee (M17.10)  Prostate Cancer  Hypertension   Allergies  No Known Drug Allergies   Family History  Cancer  First Degree Relatives. mother, father, sister, grandmother mothers side and grandfather mothers side Cerebrovascular Accident  grandmother fathers side Diabetes Mellitus  father and child Heart Disease  grandfather fathers side Rheumatoid Arthritis  First Degree Relatives. mother  Social History Alcohol use  current drinker; drinks beer and wine; less than 5 per week Children  3 Current work status  working full time Drug/Alcohol Rehab (Currently)  no Drug/Alcohol Rehab (Previously)  no Exercise  Exercises daily; does running / walking and gym / weights Illicit drug use  no Living  situation  live with spouse Marital status  married Number of flights of stairs before winded  2-3 Pain Contract  no Tobacco / smoke  exposure  yes outdoors only Tobacco use  Never smoker. never smoker  Medication History ( Fish Oil Concentrate (1 (one) Oral) Specific strength unknown - Active. Ibuprofen (200MG  Capsule, 1 (one) Oral) Active. HydroCHLOROthiazide (12.5MG  Capsule, Oral) Active. Medications Reconciled  Past Surgical History Arthroscopy of Knee  left Prostatectomy; Abdominal  Prostatectomy; Transurethral  Rotator Cuff Repair  bilateral Elbow Surgery - DOS: 07-20-12  Review of Systems  General Not Present- Chills, Fatigue, Fever, Memory Loss, Night Sweats, Weight Gain and Weight Loss. Skin Not Present- Eczema, Hives, Itching, Lesions and Rash. HEENT Not Present- Dentures, Double Vision, Headache, Hearing Loss, Tinnitus and Visual Loss. Respiratory Not Present- Allergies, Chronic Cough, Coughing up blood, Shortness of breath at rest and Shortness of breath with exertion. Cardiovascular Not Present- Chest Pain, Difficulty Breathing Lying Down, Murmur, Palpitations, Racing/skipping heartbeats and Swelling. Gastrointestinal Not Present- Abdominal Pain, Bloody Stool, Constipation, Diarrhea, Difficulty Swallowing, Heartburn, Jaundice, Loss of appetitie, Nausea and Vomiting. Male Genitourinary Not Present- Blood in Urine, Discharge, Flank Pain, Incontinence, Painful Urination, Urgency, Urinary frequency, Urinary Retention, Urinating at Night and Weak urinary stream. Musculoskeletal Present- Joint Pain. Not Present- Back Pain, Joint Swelling, Morning Stiffness, Muscle Pain, Muscle Weakness and Spasms. Neurological Not Present- Blackout spells, Difficulty with balance, Dizziness, Paralysis, Tremor and Weakness. Psychiatric Not Present- Insomnia.  Vitals Weight: 250 lb Height: 74in Weight was reported by patient. Height was reported by patient. Body Surface Area: 2.39 m Body Mass Index: 32.1 kg/m  Pulse: 68 (Regular)  BP: 132/78 (Sitting, Right Arm, Standard)  Physical Exam General Mental  Status -Alert, cooperative and good historian. General Appearance-pleasant, Not in acute distress. Orientation-Oriented X3. Build & Nutrition-Well nourished and Well developed.  Head and Neck Head-normocephalic, atraumatic . Neck Global Assessment - supple, no bruit auscultated on the right, no bruit auscultated on the left.  Eye Pupil - Bilateral-Regular and Round. Motion - Bilateral-EOMI.  Chest and Lung Exam Auscultation Breath sounds - clear at anterior chest wall and clear at posterior chest wall. Adventitious sounds - No Adventitious sounds.  Cardiovascular Auscultation Rhythm - Regular rate and rhythm. Heart Sounds - S1 WNL and S2 WNL. Murmurs & Other Heart Sounds - Auscultation of the heart reveals - No Murmurs.  Abdomen Palpation/Percussion Tenderness - Abdomen is non-tender to palpation. Rigidity (guarding) - Abdomen is soft. Auscultation Auscultation of the abdomen reveals - Bowel sounds normal.  Male Genitourinary Note: Not done, not pertinent to present illness   Musculoskeletal Note: A well developed male, alert and oriented, in no apparent distress. His hips show normal range of motion with no discomfort. His right knee shows no swelling. Range of motion on the right 0 to 135 with no tenderness or instability. Left knee well healed scars. He has a small effusion. His range is 5 to 125. He has moderate crepitus on range of motion. There is tenderness medial and lateral. He has a tiny bit of varus, valgus play, but does have a significant anterior drawer. He does not have any other gross laxity.  RADIOGRAPHS AP both knees and lateral of the left show that the right knee has no arthritis. Left knee has severe tricompartmental bone on bone changes with osteophyte formation.   Assessment & Plan Primary osteoarthritis of left knee (M17.12)  Note:Surgical Plans: Left Total Knee Replacement  Disposition: Home, straight to outpatient therapy and  will try and  get setup at [facility]  PCP: Dr. Chinita Pester - pending at time of H&P  Topical TXA - Prostate Cancer  Anesthesia Issues: None  Patient was instructed on what medications to stop prior to surgery.  Patient gives history of having problems with one of his previous shoulder surgery when his incision "sip out" several sutures weeks following closure. His second shoulder surgery was closed with a different type of suture that did not cause any problems. He does not recall what type os suture caused the issue.  Signed electronically by Joelene Millin, III PA-C

## 2017-01-13 NOTE — Anesthesia Postprocedure Evaluation (Addendum)
Anesthesia Post Note  Patient: Brian Higgins  Procedure(s) Performed: Procedure(s) (LRB): LEFT TOTAL KNEE ARTHROPLASTY (Left)  Patient location during evaluation: PACU Anesthesia Type: Spinal Level of consciousness: oriented and awake and alert Pain management: pain level controlled Vital Signs Assessment: post-procedure vital signs reviewed and stable Respiratory status: spontaneous breathing, respiratory function stable and patient connected to nasal cannula oxygen Cardiovascular status: blood pressure returned to baseline and stable Postop Assessment: no headache, no backache and spinal receding Anesthetic complications: no       Last Vitals:  Vitals:   01/13/17 1730 01/13/17 1739  BP: 131/84 128/87  Pulse: (!) 53 (!) 54  Resp: 14 14  Temp:  36.1 C    Last Pain:  Vitals:   01/13/17 1148  TempSrc: Oral    LLE Motor Response: No movement due to regional block (01/13/17 1739) LLE Sensation: Decreased (01/13/17 1739) RLE Motor Response: No movement due to regional block (01/13/17 1739) RLE Sensation: Decreased (01/13/17 1739) L Sensory Level: L4-Anterior knee, lower leg (01/13/17 1739) R Sensory Level: L5-Outer lower leg, top of foot, great toe (01/13/17 1739)  Effie Berkshire

## 2017-01-13 NOTE — Progress Notes (Signed)
Assisted Dr. Hollis with left, ultrasound guided, adductor canal block. Side rails up, monitors on throughout procedure. See vital signs in flow sheet. Tolerated Procedure well.  

## 2017-01-14 ENCOUNTER — Encounter (HOSPITAL_COMMUNITY): Payer: Self-pay | Admitting: Orthopedic Surgery

## 2017-01-14 LAB — CBC
HCT: 36.4 % — ABNORMAL LOW (ref 39.0–52.0)
Hemoglobin: 12.6 g/dL — ABNORMAL LOW (ref 13.0–17.0)
MCH: 31.8 pg (ref 26.0–34.0)
MCHC: 34.6 g/dL (ref 30.0–36.0)
MCV: 91.9 fL (ref 78.0–100.0)
Platelets: 166 10*3/uL (ref 150–400)
RBC: 3.96 MIL/uL — ABNORMAL LOW (ref 4.22–5.81)
RDW: 13.6 % (ref 11.5–15.5)
WBC: 9.9 10*3/uL (ref 4.0–10.5)

## 2017-01-14 LAB — TYPE AND SCREEN
ABO/RH(D): A NEG
Antibody Screen: NEGATIVE

## 2017-01-14 LAB — BASIC METABOLIC PANEL
Anion gap: 7 (ref 5–15)
BUN: 16 mg/dL (ref 6–20)
CO2: 24 mmol/L (ref 22–32)
Calcium: 8.5 mg/dL — ABNORMAL LOW (ref 8.9–10.3)
Chloride: 106 mmol/L (ref 101–111)
Creatinine, Ser: 0.9 mg/dL (ref 0.61–1.24)
GFR calc Af Amer: >60 mL/min (ref >60)
GFR calc non Af Amer: >60 mL/min (ref >60)
Glucose, Bld: 143 mg/dL — ABNORMAL HIGH (ref 65–99)
Potassium: 4 mmol/L (ref 3.5–5.1)
Sodium: 137 mmol/L (ref 135–145)

## 2017-01-14 MED ORDER — RIVAROXABAN 10 MG PO TABS: 10.0000 mg | ORAL_TABLET | Freq: Every day | ORAL | 0 refills | Status: DC

## 2017-01-14 MED ORDER — OXYCODONE HCL 5 MG PO TABS: 5.0000 mg | ORAL_TABLET | ORAL | 0 refills | Status: DC | PRN

## 2017-01-14 MED ORDER — TRAMADOL HCL 50 MG PO TABS: 50.0000 mg | ORAL_TABLET | Freq: Four times a day (QID) | ORAL | 0 refills | Status: DC | PRN

## 2017-01-14 MED ORDER — METHOCARBAMOL 500 MG PO TABS: 500.0000 mg | ORAL_TABLET | Freq: Four times a day (QID) | ORAL | 0 refills | Status: DC | PRN

## 2017-01-14 MED ORDER — GABAPENTIN 300 MG PO CAPS: 300.0000 mg | ORAL_CAPSULE | Freq: Three times a day (TID) | ORAL | 0 refills | Status: DC

## 2017-01-14 NOTE — Discharge Summary (Signed)
Physician Discharge Summary   Patient ID: Brian Higgins MRN: [id number] DOB/AGE: [birth year] 61 y.o.  Admit date: 01/13/2017 Discharge date: 01-15-17  Primary Diagnosis:  Osteoarthritis  Left knee(s)  Admission Diagnoses:  Past Medical History:  Diagnosis Date  . Allergy   . Arthritis   . GERD (gastroesophageal reflux disease)    mild no meds  . Hypertension   . Prostate cancer [facility])    Discharge Diagnoses:   Principal Problem:   OA (osteoarthritis) of knee  Estimated body mass index is 33.13 kg/m as calculated from the following:   Height as of this encounter: 6' 2"  (1.88 m).   Weight as of this encounter: 117 kg (258 lb).  Procedure:  Procedure(s) (LRB): LEFT TOTAL KNEE ARTHROPLASTY (Left)   Consults: None  HPI: Brian Higgins is a 61 y.o. year old male with end stage OA of his left knee with progressively worsening pain and dysfunction. He has constant pain, with activity and at rest and significant functional deficits with difficulties even with ADLs. He has had extensive non-op management including analgesics, injections of cortisone, and home exercise program, but remains in significant pain with significant dysfunction. Radiographs show bone on bone arthritis all 3 compartments. He presents now for left Total Knee Arthroplasty.   Laboratory Data: Admission on 01/13/2017  Component Date Value Ref Range Status  . WBC 01/14/2017 9.9  4.0 - 10.5 K/uL Final  . RBC 01/14/2017 3.96* 4.22 - 5.81 MIL/uL Final  . Hemoglobin 01/14/2017 12.6* 13.0 - 17.0 g/dL Final  . HCT 01/14/2017 36.4* 39.0 - 52.0 % Final  . MCV 01/14/2017 91.9  78.0 - 100.0 fL Final  . MCH 01/14/2017 31.8  26.0 - 34.0 pg Final  . MCHC 01/14/2017 34.6  30.0 - 36.0 g/dL Final  . RDW 01/14/2017 13.6  11.5 - 15.5 % Final  . Platelets 01/14/2017 166  150 - 400 K/uL Final  . Sodium 01/14/2017 137  135 - 145 mmol/L Final  . Potassium 01/14/2017 4.0  3.5 - 5.1 mmol/L Final  . Chloride 01/14/2017 106  101 - 111  mmol/L Final  . CO2 01/14/2017 24  22 - 32 mmol/L Final  . Glucose, Bld 01/14/2017 143* 65 - 99 mg/dL Final  . BUN 01/14/2017 16  6 - 20 mg/dL Final  . Creatinine, Ser 01/14/2017 0.90  0.61 - 1.24 mg/dL Final  . Calcium 01/14/2017 8.5* 8.9 - 10.3 mg/dL Final  . GFR calc non Af Amer 01/14/2017 >60  >60 mL/min Final  . GFR calc Af Amer 01/14/2017 >60  >60 mL/min Final   Comment: (NOTE) The eGFR has been calculated using the CKD EPI equation. This calculation has not been validated in all clinical situations. eGFR's persistently <60 mL/min signify possible Chronic Kidney Disease.   Georgiann Hahn gap 01/14/2017 7  5 - 15 Final  Hospital Outpatient Visit on 01/06/2017  Component Date Value Ref Range Status  . aPTT 01/06/2017 28  24 - 36 seconds Final  . WBC 01/06/2017 4.8  4.0 - 10.5 K/uL Final  . RBC 01/06/2017 4.51  4.22 - 5.81 MIL/uL Final  . Hemoglobin 01/06/2017 13.9  13.0 - 17.0 g/dL Final  . HCT 01/06/2017 41.0  39.0 - 52.0 % Final  . MCV 01/06/2017 90.9  78.0 - 100.0 fL Final  . MCH 01/06/2017 30.8  26.0 - 34.0 pg Final  . MCHC 01/06/2017 33.9  30.0 - 36.0 g/dL Final  . RDW 01/06/2017 14.1  11.5 - 15.5 % Final  . Platelets  01/06/2017 174  150 - 400 K/uL Final  . Sodium 01/06/2017 141  135 - 145 mmol/L Final  . Potassium 01/06/2017 4.0  3.5 - 5.1 mmol/L Final  . Chloride 01/06/2017 107  101 - 111 mmol/L Final  . CO2 01/06/2017 26  22 - 32 mmol/L Final  . Glucose, Bld 01/06/2017 94  65 - 99 mg/dL Final  . BUN 01/06/2017 18  6 - 20 mg/dL Final  . Creatinine, Ser 01/06/2017 0.87  0.61 - 1.24 mg/dL Final  . Calcium 01/06/2017 9.0  8.9 - 10.3 mg/dL Final  . Total Protein 01/06/2017 7.3  6.5 - 8.1 g/dL Final  . Albumin 01/06/2017 4.5  3.5 - 5.0 g/dL Final  . AST 01/06/2017 20  15 - 41 U/L Final  . ALT 01/06/2017 22  17 - 63 U/L Final  . Alkaline Phosphatase 01/06/2017 41  38 - 126 U/L Final  . Total Bilirubin 01/06/2017 1.0  0.3 - 1.2 mg/dL Final  . GFR calc non Af Amer 01/06/2017 >60   >60 mL/min Final  . GFR calc Af Amer 01/06/2017 >60  >60 mL/min Final   Comment: (NOTE) The eGFR has been calculated using the CKD EPI equation. This calculation has not been validated in all clinical situations. eGFR's persistently <60 mL/min signify possible Chronic Kidney Disease.   . Anion gap 01/06/2017 8  5 - 15 Final  . Prothrombin Time 01/06/2017 12.7  11.4 - 15.2 seconds Final  . INR 01/06/2017 0.95   Final  . ABO/RH(D) 01/06/2017 A NEG   Final  . Antibody Screen 01/06/2017 NEG   Final  . Sample Expiration 01/06/2017 01/16/2017   Final  . Extend sample reason 01/06/2017 NO TRANSFUSIONS OR PREGNANCY IN THE PAST 3 MONTHS   Final  . MRSA, PCR 01/06/2017 NEGATIVE  NEGATIVE Final  . Staphylococcus aureus 01/06/2017 NEGATIVE  NEGATIVE Final   Comment:        The Xpert SA Assay (FDA approved for NASAL specimens in patients over 36 years of age), is one component of a comprehensive surveillance program.  Test performance has been validated by [facility] for patients greater than or equal to 20 year old. It is not intended to diagnose infection nor to guide or monitor treatment.      X-Rays:No results found.  EKG: Orders placed or performed during the hospital encounter of 01/06/17  . EKG 12-Lead  . EKG 12-Lead     Hospital Course: Brian Higgins is a 61 y.o. who was admitted to [facility]. They were brought to the operating room on 01/13/2017 and underwent Procedure(s): LEFT TOTAL KNEE ARTHROPLASTY.  Patient tolerated the procedure well and was later transferred to the recovery room and then to the orthopaedic floor for postoperative care.  They were given PO and IV analgesics for pain control following their surgery.  They were given 24 hours of postoperative antibiotics of  Anti-infectives    Start     Dose/Rate Route Frequency Ordered Stop   01/13/17 2100  ceFAZolin (ANCEF) IVPB 2g/100 mL premix     2 g 200 mL/hr over 30 Minutes Intravenous Every 6 hours  01/13/17 1803 01/14/17 0400   01/13/17 1148  ceFAZolin (ANCEF) 2-4 GM/100ML-% IVPB    Comments:  Bridget Hartshorn   : cabinet override      01/13/17 1148 01/13/17 1457   01/13/17 1141  ceFAZolin (ANCEF) IVPB 2g/100 mL premix     2 g 200 mL/hr over 30 Minutes Intravenous On call to  O.R. 01/13/17 1141 01/13/17 1527     and started on DVT prophylaxis in the form of Xarelto.   PT and OT were ordered for total joint protocol.  Discharge planning consulted to help with postop disposition and equipment needs.  Patient had a decent night on the evening of surgery.  They started to get up OOB with therapy on day one. Hemovac drain was pulled without difficulty.  Continued to work with therapy into day two.  Dressing was changed on day two and the incision was healing well.  Patient was seen in rounds and was ready to go home.  Diet - Cardiac diet Follow up - in 2 weeks Activity - WBAT Disposition - Home Condition Upon Discharge - Good D/C Meds - See DC Summary DVT Prophylaxis - Xarelto  Discharge Instructions    Call MD / Call 911    Complete by:  As directed    If you experience chest pain or shortness of breath, CALL 911 and be transported to the hospital emergency room.  If you develope a fever above 101 F, pus (white drainage) or increased drainage or redness at the wound, or calf pain, call your surgeon's office.   Change dressing    Complete by:  As directed    Change dressing daily with sterile 4 x 4 inch gauze dressing and apply TED hose. Do not submerge the incision under water.   Constipation Prevention    Complete by:  As directed    Drink plenty of fluids.  Prune juice may be helpful.  You may use a stool softener, such as Colace (over the counter) 100 mg twice a day.  Use MiraLax (over the counter) for constipation as needed.   Diet - low sodium heart healthy    Complete by:  As directed    Discharge instructions    Complete by:  As directed    Pick up stool softner and laxative  for home use following surgery while on pain medications. Do not submerge incision under water. Please use good hand washing techniques while changing dressing each day. May shower starting three days after surgery. Please use a clean towel to pat the incision dry following showers. Continue to use ice for pain and swelling after surgery. Do not use any lotions or creams on the incision until instructed by your surgeon.  Wear both TED hose on both legs during the day every day for three weeks, but may have off at night at home.  Postoperative Constipation Protocol  Constipation - defined medically as fewer than three stools per week and severe constipation as less than one stool per week.  One of the most common issues patients have following surgery is constipation.  Even if you have a regular bowel pattern at home, your normal regimen is likely to be disrupted due to multiple reasons following surgery.  Combination of anesthesia, postoperative narcotics, change in appetite and fluid intake all can affect your bowels.  In order to avoid complications following surgery, here are some recommendations in order to help you during your recovery period.  Colace (docusate) - Pick up an over-the-counter form of Colace or another stool softener and take twice a day as long as you are requiring postoperative pain medications.  Take with a full glass of water daily.  If you experience loose stools or diarrhea, hold the colace until you stool forms back up.  If your symptoms do not get better within 1 week or if they get  worse, check with your doctor.  Dulcolax (bisacodyl) - Pick up over-the-counter and take as directed by the product packaging as needed to assist with the movement of your bowels.  Take with a full glass of water.  Use this product as needed if not relieved by Colace only.   MiraLax (polyethylene glycol) - Pick up over-the-counter to have on hand.  MiraLax is a solution that will increase  the amount of water in your bowels to assist with bowel movements.  Take as directed and can mix with a glass of water, juice, soda, coffee, or tea.  Take if you go more than two days without a movement. Do not use MiraLax more than once per day. Call your doctor if you are still constipated or irregular after using this medication for 7 days in a row.  If you continue to have problems with postoperative constipation, please contact the office for further assistance and recommendations.  If you experience "the worst abdominal pain ever" or develop nausea or vomiting, please contact the office immediatly for further recommendations for treatment.   Take Xarelto for two and a half more weeks, then discontinue Xarelto. Once the patient has completed the blood thinner regimen, then take a Baby 81 mg Aspirin daily for three more weeks.   Do not put a pillow under the knee. Place it under the heel.    Complete by:  As directed    Do not sit on low chairs, stoools or toilet seats, as it may be difficult to get up from low surfaces    Complete by:  As directed    Driving restrictions    Complete by:  As directed    No driving until released by the physician.   Increase activity slowly as tolerated    Complete by:  As directed    Lifting restrictions    Complete by:  As directed    No lifting until released by the physician.   Patient may shower    Complete by:  As directed    You may shower without a dressing once there is no drainage.  Do not wash over the wound.  If drainage remains, do not shower until drainage stops.   TED hose    Complete by:  As directed    Use stockings (TED hose) for 3 weeks on both leg(s).  You may remove them at night for sleeping.   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   cetirizine 10 MG tablet Commonly known as:  ZYRTEC Take 10 mg by mouth daily as needed for allergies (for seasonal allergies (pt. takes in Spring & Fall)).   gabapentin 300 MG capsule Commonly known as:  NEURONTIN Take 1 capsule (300 mg total) by mouth 3 (three) times daily. Gabapentin 300 mg Protocol Take a 300 mg capsule three times a day for one  week, Then a 300 mg capsule twice a day for one week, Then a 300 mg capsule once a day for one week, then discontinue the Gabapentin.   hydrochlorothiazide 12.5 MG capsule Commonly known as:  MICROZIDE Take 1 capsule (12.5 mg total) by mouth daily.   methocarbamol 500 MG tablet Commonly known as:  ROBAXIN Take 1 tablet (500 mg total) by mouth every 6 (six) hours as needed for muscle spasms.   oxyCODONE 5 MG immediate release tablet Commonly known as:  Oxy IR/ROXICODONE Take 1-2 tablets (5-10 mg total) by mouth every 4 (four) hours as needed for moderate pain or severe pain.   rivaroxaban 10 MG Tabs tablet Commonly known as:  XARELTO Take 1 tablet (10 mg total) by mouth daily with breakfast. Take Xarelto for two and a half more weeks following discharge from the hospital, then discontinue Xarelto. Once the patient has completed the blood thinner regimen, then take a Baby 81 mg Aspirin daily for three more weeks. Start taking on:  01/15/2017   SYSTANE BALANCE 0.6 % Soln Generic drug:  Propylene Glycol Place 1 drop into both eyes 2 (two) times daily as needed (for allergies).   traMADol 50 MG tablet Commonly known as:  ULTRAM Take 1-2 tablets (50-100 mg total) by mouth every 6 (six) hours as needed for moderate pain.            Durable Medical  Equipment        Start     Ordered   01/14/17 1035  For home use only DME Walker rolling  Once    Question:  Patient needs a walker to treat with the following condition  Answer:  Osteoarthritis of knee   01/14/17 1034     Follow-up Information    [name], MD. Schedule an appointment as soon as possible for a visit on 01/28/2017.   Specialty:  Orthopedic Surgery Contact information: [address] [place] [address] [phone]           Signed: Arlee Muslim, PA-C Orthopaedic Surgery 01/14/2017, 9:06 PM

## 2017-01-14 NOTE — Progress Notes (Signed)
Discharge plan:  Pt plans to DC home with outpatient physical therapy. RW ordered for pt and [facility] made aware of order. Pt declines 3in1 at this time and states he has a bench in his home shower.  Marney Doctor RN,BSN,NCM [phone]

## 2017-01-14 NOTE — Progress Notes (Signed)
Physical Therapy Treatment Patient Details Name: Brian Higgins MRN: [id number] DOB: 16-Sep-1956 Today's Date: 01/14/2017    History of Present Illness s/p L TKA    PT Comments    Progressing well; right hip pain improved from am   Follow Up Recommendations  Outpatient PT     Equipment Recommendations  Rolling walker with 5" wheels    Recommendations for Other Services       Precautions / Restrictions Precautions Precautions: Fall;Knee Precaution Comments: IND SLRs Required Braces or Orthoses: Knee Immobilizer - Left Knee Immobilizer - Left: Discontinue once straight leg raise with < 10 degree lag Restrictions Other Position/Activity Restrictions: WBAT LLE     Mobility  Bed Mobility Overal bed mobility: Needs Assistance Bed Mobility: Supine to Sit;Sit to Supine     Supine to sit: Supervision Sit to supine: Supervision   General bed mobility comments: for safety  Transfers Overall transfer level: Needs assistance Equipment used: Rolling walker (2 wheeled) Transfers: Sit to/from Stand Sit to Stand: Min guard         General transfer comment: cues for hand placement and LLE position  Ambulation/Gait Ambulation/Gait assistance: Min guard Ambulation Distance (Feet): 160 Feet Assistive device: Rolling walker (2 wheeled) Gait Pattern/deviations: Step-to pattern;Antalgic     General Gait Details: cues for sequence, RW position and step length   Stairs            Wheelchair Mobility    Modified Rankin (Stroke Patients Only)       Balance Overall balance assessment: Needs assistance   Sitting balance-Leahy Scale: Good Sitting balance - Comments: pt able to dress self during PT session, min/mod assist with LB dressing (wife educated on technique), supervision UB for safety;     Standing balance-Leahy Scale: Fair Standing balance comment: able stand at sink and brush teeth during PT session (at pt request) with supervision for safety, no LOB                             Cognition Arousal/Alertness: Awake/alert Behavior During Therapy: WFL for tasks assessed/performed Overall Cognitive Status: Within Functional Limits for tasks assessed                                        Exercises Total Joint Exercises Ankle Circles/Pumps: AROM;Both;10 reps Quad Sets: AROM;10 reps;Both Heel Slides: AAROM;Left;10 reps Hip ABduction/ADduction: AROM;Strengthening;Left;10 reps Straight Leg Raises: AROM;Strengthening;Left;10 reps    General Comments        Pertinent Vitals/Pain Pain Assessment: 0-10 Pain Score: 4  Pain Location: L knee  Pain Descriptors / Indicators: Guarding;Grimacing;Sore Pain Intervention(s): Limited activity within patient's tolerance;Monitored during session;Ice applied    Home Living Family/patient expects to be discharged to:: Private residence       Home Access: Stairs to enter Entrance Stairs-Rails: Right Home Layout: Multi-level (recliner) Home Equipment: None Additional Comments: Pt's daughter will be home the first week following DC to assist, Pt's spouse works but will be home intermittently, Pt plans to sleep on main level (in [place]) initially    Prior Function Level of Independence: Independent          PT Goals (current goals can now be found in the care plan section) Acute Rehab PT Goals Patient Stated Goal: to get back to doing things on his own  PT Goal Formulation: With patient Time For Goal Achievement:  01/16/17 Potential to Achieve Goals: Good Progress towards PT goals: Progressing toward goals    Frequency    7X/week      PT Plan Current plan remains appropriate    Co-evaluation             End of Session Equipment Utilized During Treatment: Gait belt Activity Tolerance: Patient tolerated treatment well Patient left: in bed;with call bell/phone within reach;with bed alarm set   PT Visit Diagnosis: Difficulty in walking, not elsewhere  classified (R26.2)     Time: 8144-8185 PT Time Calculation (min) (ACUTE ONLY): 30 min  Charges:  $Gait Training: 8-22 mins $Therapeutic Exercise: 8-22 mins                    G Codes:          Brian Higgins 2017-02-02, 2:41 PM

## 2017-01-14 NOTE — Evaluation (Signed)
Occupational Therapy Evaluation Patient Details Name: Brian Higgins MRN: [id number] DOB: [birth year] Today's Date: 01/14/2017    History of Present Illness s/p L TKA   Clinical Impression   Pt is a 61 y/o M who presents with the above. Pt currently requires MinGuard assist for functional mobility and increased assist for lower body dressing. Pt's spouse and daughter will be home to assist with ADL needs PRN. Education provided and questions answered. Pt feels comfortable completing ADLs upon return home and with assist from family. No further acute OT needs at this time. Will sign off.     Follow Up Recommendations  No OT follow up;Supervision - Intermittent    Equipment Recommendations  None recommended by OT           Precautions / Restrictions Precautions Precautions: Fall;Knee Required Braces or Orthoses: Knee Immobilizer - Left Restrictions Other Position/Activity Restrictions: WBAT LLE       Mobility Bed Mobility Overal bed mobility: Needs Assistance Bed Mobility: Supine to Sit     Supine to sit: Min assist     General bed mobility comments: assist for LLE  Transfers Overall transfer level: Needs assistance Equipment used: Rolling walker (2 wheeled) Transfers: Sit to/from Stand Sit to Stand: Min guard              Balance Overall balance assessment: Needs assistance Sitting-balance support: Feet supported Sitting balance-Leahy Scale: Good     Standing balance support: Bilateral upper extremity supported Standing balance-Leahy Scale: Fair                             ADL either performed or assessed with clinical judgement   ADL Overall ADL's : Needs assistance/impaired Eating/Feeding: Independent;Sitting   Grooming: Wash/dry hands;Min guard;Standing   Upper Body Bathing: Min guard;Sitting   Lower Body Bathing: Sit to/from stand;Min guard   Upper Body Dressing : Set up;Sitting   Lower Body Dressing: Moderate assistance;Sit  to/from stand   Toilet Transfer: Min guard;Ambulation;Comfort height toilet;Grab bars;RW   Toileting- Water quality scientist and Hygiene: Min guard;Sit to/from stand   Tub/ Shower Transfer: Walk-in shower;Minimal assistance;Rolling walker;Ambulation Tub/Shower Transfer Details (indicate cue type and reason): verbal cues for sequencing  Functional mobility during ADLs: Min guard;Rolling walker General ADL Comments: Pt completed room level functional mobility, toilet transfer, shower transfer, educated on compensatory techniques for completing ADLs, educated on knee precautions                         Pertinent Vitals/Pain Pain Assessment: 0-10 Pain Score: 4  Pain Location: L knee  Pain Descriptors / Indicators: Sore Pain Intervention(s): Limited activity within patient's tolerance;Monitored during session;Premedicated before session;Ice applied          Extremity/Trunk Assessment Upper Extremity Assessment Upper Extremity Assessment: Overall WFL for tasks assessed           Communication Communication Communication: No difficulties   Cognition Arousal/Alertness: Awake/alert Behavior During Therapy: WFL for tasks assessed/performed Overall Cognitive Status: Within Functional Limits for tasks assessed                                     General Comments                  Home Living Family/patient expects to be discharged to:: Private residence Living Arrangements: Spouse/significant other;Children Available Help at  Discharge: Available PRN/intermittently;Family         Home Layout: Multi-level     Bathroom Shower/Tub: Occupational psychologist: Handicapped height     Home Equipment: Shower seat - built in   Additional Comments: Pt's daughter will be home the first week following DC to assist, Pt's spouse works but will be home intermittently, Pt plans to sleep on main level initially      Prior Functioning/Environment Level  of Independence: Independent                 OT Problem List: Decreased activity tolerance;Decreased strength            OT Goals(Current goals can be found in the care plan section) Acute Rehab OT Goals Patient Stated Goal: to get back to doing things on his own  OT Goal Formulation: With patient                                 End of Session Equipment Utilized During Treatment: Gait belt;Rolling walker Nurse Communication: Mobility status  Activity Tolerance: Patient tolerated treatment well Patient left: in chair;with call bell/phone within reach  OT Visit Diagnosis: Muscle weakness (generalized) (M62.81);Unsteadiness on feet (R26.81)                Time: 9371-6967 OT Time Calculation (min): 22 min Charges:  OT General Charges $OT Visit: 1 Procedure OT Evaluation $OT Eval Low Complexity: 1 Procedure G-Codes:     Brian Higgins, OT Pager [phone] 01/14/2017   Brian Higgins 01/14/2017, 9:02 AM

## 2017-01-14 NOTE — Progress Notes (Signed)
   Subjective: 1 Day Post-Op Procedure(s) (LRB): LEFT TOTAL KNEE ARTHROPLASTY (Left) Patient reports pain as mild.   Patient seen in rounds for Dr. Wynelle Link on morning rounds. Patient is well, but has had some minor complaints of pain in the knee, requiring pain medications We will start therapy today.  Plan is to go Home after hospital stay.  Objective: Vital signs in last 24 hours: Temp:  [97.8 F (36.6 C)-98.4 F (36.9 C)] 98.4 F (36.9 C) (04/24 1524) Pulse Rate:  [63-74] 68 (04/24 1524) Resp:  [16-17] 16 (04/24 1524) BP: (114-139)/(46-78) 126/52 (04/24 1524) SpO2:  [94 %-98 %] 98 % (04/24 0957)  Intake/Output from previous day:  Intake/Output Summary (Last 24 hours) at 01/14/17 2051 Last data filed at 01/14/17 1816  Gross per 24 hour  Intake             2220 ml  Output             3860 ml  Net            -1640 ml    Intake/Output this shift: No intake/output data recorded.  Labs:  Recent Labs  01/14/17 0420  HGB 12.6*    Recent Labs  01/14/17 0420  WBC 9.9  RBC 3.96*  HCT 36.4*  PLT 166    Recent Labs  01/14/17 0420  NA 137  K 4.0  CL 106  CO2 24  BUN 16  CREATININE 0.90  GLUCOSE 143*  CALCIUM 8.5*   No results for input(s): LABPT, INR in the last 72 hours.  EXAM General - Patient is Alert, Appropriate and Oriented Extremity - Neurovascular intact Sensation intact distally Intact pulses distally Dorsiflexion/Plantar flexion intact Dressing - dressing C/D/I Motor Function - intact, moving foot and toes well on exam.  Hemovac pulled without difficulty.  Past Medical History:  Diagnosis Date  . Allergy   . Arthritis   . GERD (gastroesophageal reflux disease)    mild no meds  . Hypertension   . Prostate cancer ([place])     Assessment/Plan: 1 Day Post-Op Procedure(s) (LRB): LEFT TOTAL KNEE ARTHROPLASTY (Left) Principal Problem:   OA (osteoarthritis) of knee  Estimated body mass index is 33.13 kg/m as calculated from the  following:   Height as of this encounter: 6\' 2"  (1.88 m).   Weight as of this encounter: 117 kg (258 lb). Up with therapy Discharge home - straight to outpatient therapy  DVT Prophylaxis - Xarelto Weight-Bearing as tolerated to left leg D/C O2 and Pulse OX and try on Room Air  Arlee Muslim, PA-C Orthopaedic Surgery 01/14/2017, 8:51 PM

## 2017-01-14 NOTE — Discharge Instructions (Addendum)
° °Dr. Iyad [name] °Total Joint Specialist °[facility] °[address] °[place], [place] [address] °[phone] ° °TOTAL KNEE REPLACEMENT POSTOPERATIVE DIRECTIONS ° °Knee Rehabilitation, Guidelines Following Surgery  °Results after knee surgery are often greatly improved when you follow the exercise, range of motion and muscle strengthening exercises prescribed by your doctor. Safety measures are also important to protect the knee from further injury. Any time any of these exercises cause you to have increased pain or swelling in your knee joint, decrease the amount until you are comfortable again and slowly increase them. If you have problems or questions, call your caregiver or physical therapist for advice.  ° °HOME CARE INSTRUCTIONS  °Remove items at home which could result in a fall. This includes throw rugs or furniture in walking pathways.  °· ICE to the affected knee every three hours for 30 minutes at a time and then as needed for pain and swelling.  Continue to use ice on the knee for pain and swelling from surgery. You may notice swelling that will progress down to the foot and ankle.  This is normal after surgery.  Elevate the leg when you are not up walking on it.   °· Continue to use the breathing machine which will help keep your temperature down.  It is common for your temperature to cycle up and down following surgery, especially at night when you are not up moving around and exerting yourself.  The breathing machine keeps your lungs expanded and your temperature down. °· Do not place pillow under knee, focus on keeping the knee straight while resting ° °DIET °You may resume your previous home diet once your are discharged from the hospital. ° °DRESSING / WOUND CARE / SHOWERING °You may shower 3 days after surgery, but keep the wounds dry during showering.  You may use an occlusive plastic wrap (Press'n Seal for example), NO SOAKING/SUBMERGING IN THE BATHTUB.  If the  bandage gets wet, change with a clean dry gauze.  If the incision gets wet, pat the wound dry with a clean towel. °You may start showering once you are discharged home but do not submerge the incision under water. Just pat the incision dry and apply a dry gauze dressing on daily. °Change the surgical dressing daily and reapply a dry dressing each time. ° °ACTIVITY °Walk with your walker as instructed. °Use walker as long as suggested by your caregivers. °Avoid periods of inactivity such as sitting longer than an hour when not asleep. This helps prevent blood clots.  °You may resume a sexual relationship in one month or when given the OK by your doctor.  °You may return to work once you are cleared by your doctor.  °Do not drive a car for 6 weeks or until released by you surgeon.  °Do not drive while taking narcotics. ° °WEIGHT BEARING °Weight bearing as tolerated with assist device (walker, cane, etc) as directed, use it as long as suggested by your surgeon or therapist, typically at least 4-6 weeks. ° °POSTOPERATIVE CONSTIPATION PROTOCOL °Constipation - defined medically as fewer than three stools per week and severe constipation as less than one stool per week. ° °One of the most common issues patients have following surgery is constipation.  Even if you have a regular bowel pattern at home, your normal regimen is likely to be disrupted due to multiple reasons following surgery.  Combination of anesthesia, postoperative narcotics, change in appetite and fluid intake all can affect your bowels.    In order to avoid complications following surgery, here are some recommendations in order to help you during your recovery period. ° °Colace (docusate) - Pick up an over-the-counter form of Colace or another stool softener and take twice a day as long as you are requiring postoperative pain medications.  Take with a full glass of water daily.  If you experience loose stools or diarrhea, hold the colace until you stool forms  back up.  If your symptoms do not get better within 1 week or if they get worse, check with your doctor. ° °Dulcolax (bisacodyl) - Pick up over-the-counter and take as directed by the product packaging as needed to assist with the movement of your bowels.  Take with a full glass of water.  Use this product as needed if not relieved by Colace only.  ° °MiraLax (polyethylene glycol) - Pick up over-the-counter to have on hand.  MiraLax is a solution that will increase the amount of water in your bowels to assist with bowel movements.  Take as directed and can mix with a glass of water, juice, soda, coffee, or tea.  Take if you go more than two days without a movement. °Do not use MiraLax more than once per day. Call your doctor if you are still constipated or irregular after using this medication for 7 days in a row. ° °If you continue to have problems with postoperative constipation, please contact the office for further assistance and recommendations.  If you experience "the worst abdominal pain ever" or develop nausea or vomiting, please contact the office immediatly for further recommendations for treatment. ° °ITCHING ° If you experience itching with your medications, try taking only a single pain pill, or even half a pain pill at a time.  You can also use Benadryl over the counter for itching or also to help with sleep.  ° °TED HOSE STOCKINGS °Wear the elastic stockings on both legs for three weeks following surgery during the day but you may remove then at night for sleeping. ° °MEDICATIONS °See your medication summary on the “After Visit Summary” that the nursing staff will review with you prior to discharge.  You may have some home medications which will be placed on hold until you complete the course of blood thinner medication.  It is important for you to complete the blood thinner medication as prescribed by your surgeon.  Continue your approved medications as instructed at time of  discharge. ° °PRECAUTIONS °If you experience chest pain or shortness of breath - call 911 immediately for transfer to the hospital emergency department.  °If you develop a fever greater that 101 F, purulent drainage from wound, increased redness or drainage from wound, foul odor from the wound/dressing, or calf pain - CONTACT YOUR SURGEON.   °                                                °FOLLOW-UP APPOINTMENTS °Make sure you keep all of your appointments after your operation with your surgeon and caregivers. You should call the office at the above phone number and make an appointment for approximately two weeks after the date of your surgery or on the date instructed by your surgeon outlined in the "After Visit Summary". ° ° °RANGE OF MOTION AND STRENGTHENING EXERCISES  °Rehabilitation of the knee is important following a knee injury or   an operation. After just a few days of immobilization, the muscles of the thigh which control the knee become weakened and shrink (atrophy). Knee exercises are designed to build up the tone and strength of the thigh muscles and to improve knee motion. Often times heat used for twenty to thirty minutes before working out will loosen up your tissues and help with improving the range of motion but do not use heat for the first two weeks following surgery. These exercises can be done on a training (exercise) mat, on the floor, on a table or on a bed. Use what ever works the best and is most comfortable for you Knee exercises include:  °Leg Lifts - While your knee is still immobilized in a splint or cast, you can do straight leg raises. Lift the leg to 60 degrees, hold for 3 sec, and slowly lower the leg. Repeat 10-20 times 2-3 times daily. Perform this exercise against resistance later as your knee gets better.  °Quad and Hamstring Sets - Tighten up the muscle on the front of the thigh (Quad) and hold for 5-10 sec. Repeat this 10-20 times hourly. Hamstring sets are done by pushing the  foot backward against an object and holding for 5-10 sec. Repeat as with quad sets.  °· Leg Slides: Lying on your back, slowly slide your foot toward your buttocks, bending your knee up off the floor (only go as far as is comfortable). Then slowly slide your foot back down until your leg is flat on the floor again. °· Angel Wings: Lying on your back spread your legs to the side as far apart as you can without causing discomfort.  °A rehabilitation program following serious knee injuries can speed recovery and prevent re-injury in the future due to weakened muscles. Contact your doctor or a physical therapist for more information on knee rehabilitation.  ° °IF YOU ARE TRANSFERRED TO A SKILLED REHAB FACILITY °If the patient is transferred to a skilled rehab facility following release from the hospital, a list of the current medications will be sent to the facility for the patient to continue.  When discharged from the skilled rehab facility, please have the facility set up the patient's Home Health Physical Therapy prior to being released. Also, the skilled facility will be responsible for providing the patient with their medications at time of release from the facility to include their pain medication, the muscle relaxants, and their blood thinner medication. If the patient is still at the rehab facility at time of the two week follow up appointment, the skilled rehab facility will also need to assist the patient in arranging follow up appointment in our office and any transportation needs. ° °MAKE SURE YOU:  °Understand these instructions.  °Get help right away if you are not doing well or get worse.  ° ° °Pick up stool softner and laxative for home use following surgery while on pain medications. °Do not submerge incision under water. °Please use good hand washing techniques while changing dressing each day. °May shower starting three days after surgery. °Please use a clean towel to pat the incision dry following  showers. °Continue to use ice for pain and swelling after surgery. °Do not use any lotions or creams on the incision until instructed by your surgeon. ° °Take Xarelto for two and a half more weeks following discharge from the hospital, then discontinue Xarelto. °Once the patient has completed the blood thinner regimen, then take a Baby 81 mg Aspirin daily for three   more weeks.    Information on my medicine - XARELTO (Rivaroxaban)  This medication education was reviewed with me or my healthcare representative as part of my discharge preparation.  The pharmacist that spoke with me during my hospital stay was:  Henreitta Leber. PharmD  Why was Xarelto prescribed for you? Xarelto was prescribed for you to reduce the risk of blood clots forming after orthopedic surgery. The medical term for these abnormal blood clots is venous thromboembolism (VTE).  What do you need to know about xarelto ? Take your Xarelto ONCE DAILY at the same time every day. You may take it either with or without food.  If you have difficulty swallowing the tablet whole, you may crush it and mix in applesauce just prior to taking your dose.  Take Xarelto exactly as prescribed by your doctor and DO NOT stop taking Xarelto without talking to the doctor who prescribed the medication.  Stopping without other VTE prevention medication to take the place of Xarelto may increase your risk of developing a clot.  After discharge, you should have regular check-up appointments with your healthcare provider that is prescribing your Xarelto.    What do you do if you miss a dose? If you miss a dose, take it as soon as you remember on the same day then continue your regularly scheduled once daily regimen the next day. Do not take two doses of Xarelto on the same day.   Important Safety Information A possible side effect of Xarelto is bleeding. You should call your healthcare provider right away if you experience any of the  following: ? Bleeding from an injury or your nose that does not stop. ? Unusual colored urine (red or dark brown) or unusual colored stools (red or black). ? Unusual bruising for unknown reasons. ? A serious fall or if you hit your head (even if there is no bleeding).  Some medicines may interact with Xarelto and might increase your risk of bleeding while on Xarelto. To help avoid this, consult your healthcare provider or pharmacist prior to using any new prescription or non-prescription medications, including herbals, vitamins, non-steroidal anti-inflammatory drugs (NSAIDs) and supplements.  This website has more information on Xarelto: [email or web address]

## 2017-01-14 NOTE — Evaluation (Signed)
Physical Therapy Evaluation Patient Details Name: Brian Higgins MRN: [id number] DOB: 1956/05/03 Today's Date: 01/14/2017   History of Present Illness  s/p L TKA  Clinical Impression  Pt is s/p TKA resulting in the deficits listed below (see PT Problem List). *Pt will benefit from skilled PT to increase their independence and safety with mobility to allow discharge to the venue listed below.  Pt with c/o L hip pain, positioned in s/l to allow light IT band stretch and applied warm pack to L hip area--pt reports soreness from a spasm episode while in [facility] yesterday; will continue to follow     Follow Up Recommendations Outpatient PT    Equipment Recommendations    RW   Recommendations for Other Services       Precautions / Restrictions Precautions Precautions: Fall;Knee Required Braces or Orthoses: Knee Immobilizer - Left Knee Immobilizer - Left: Discontinue once straight leg raise with < 10 degree lag Restrictions Other Position/Activity Restrictions: WBAT LLE       Mobility  Bed Mobility Overal bed mobility: Needs Assistance Bed Mobility: Supine to Sit;Sit to Supine     Supine to sit: Supervision Sit to supine: Supervision   General bed mobility comments: for safety  Transfers Overall transfer level: Needs assistance Equipment used: Rolling walker (2 wheeled) Transfers: Sit to/from Stand Sit to Stand: Min guard         General transfer comment: cues for hand placement and LLE position  Ambulation/Gait Ambulation/Gait assistance: Min guard Ambulation Distance (Feet): 80 Feet Assistive device: Rolling walker (2 wheeled) Gait Pattern/deviations: Step-to pattern;Antalgic     General Gait Details: L knee flexed ~10* throughout gait cycle, cues for sequence, RW position and step length  Stairs            Wheelchair Mobility    Modified Rankin (Stroke Patients Only)       Balance Overall balance assessment: Needs assistance Sitting-balance  support: Feet supported Sitting balance-Leahy Scale: Good Sitting balance - Comments: pt able to dress self during PT session, min/mod assist with LB dressing (wife educated on technique), supervision UB for safety;   Standing balance support: Bilateral upper extremity supported Standing balance-Leahy Scale: Fair Standing balance comment: able stand at sink and brush teeth during PT session (at pt request) with supervision for safety, no LOB                             Pertinent Vitals/Pain Pain Assessment: 0-10 Pain Score: 4  Pain Location: L knee  Pain Descriptors / Indicators: Guarding;Grimacing;Sore Pain Intervention(s): Limited activity within patient's tolerance;Monitored during session;Premedicated before session;Repositioned;Ice applied    Home Living Family/patient expects to be discharged to:: Private residence Living Arrangements: Spouse/significant other;Children Available Help at Discharge: Available PRN/intermittently;Family   Home Access: Stairs to enter Entrance Stairs-Rails: Right Entrance Stairs-Number of Steps: 6-7 Home Layout: Multi-level (recliner) Home Equipment: None Additional Comments: Pt's daughter will be home the first week following DC to assist, Pt's spouse works but will be home intermittently, Pt plans to sleep on main level (in [place]) initially    Prior Function Level of Independence: Independent               Hand Dominance        Extremity/Trunk Assessment   Upper Extremity Assessment Upper Extremity Assessment: Overall WFL for tasks assessed    Lower Extremity Assessment Lower Extremity Assessment: LLE deficits/detail LLE Deficits / Details: ankle WFL; knee extension and hip  flexion 3/5, knee flexion AAROM ~5* to 60*       Communication   Communication: No difficulties  Cognition Arousal/Alertness: Awake/alert Behavior During Therapy: WFL for tasks assessed/performed Overall Cognitive Status: Within Functional  Limits for tasks assessed                                        General Comments      Exercises Total Joint Exercises Ankle Circles/Pumps: AROM;Both;10 reps Quad Sets: AROM;10 reps;Both   Assessment/Plan    PT Assessment Patient needs continued PT services  PT Problem List Decreased strength;Decreased activity tolerance;Decreased range of motion;Decreased mobility;Decreased knowledge of use of DME;Pain       PT Treatment Interventions DME instruction;Gait training;Functional mobility training;Stair training;Therapeutic activities;Therapeutic exercise;Patient/family education    PT Goals (Current goals can be found in the Care Plan section)  Acute Rehab PT Goals Patient Stated Goal: to get back to doing things on his own  PT Goal Formulation: With patient Time For Goal Achievement: 01/16/17 Potential to Achieve Goals: Good    Frequency 7X/week   Barriers to discharge        Co-evaluation               End of Session Equipment Utilized During Treatment: Gait belt Activity Tolerance: Patient tolerated treatment well Patient left: in bed;with call bell/phone within reach;with bed alarm set;with family/visitor present (in s/l to assist with L hip pain)   PT Visit Diagnosis: Difficulty in walking, not elsewhere classified (R26.2)    Time: 1497-0263 PT Time Calculation (min) (ACUTE ONLY): 35 min   Charges:   PT Evaluation $PT Eval Low Complexity: 1 Procedure PT Treatments $Gait Training: 8-22 mins   PT G Codes:          Edelin Fryer 01-26-2017, 12:14 PM

## 2017-01-15 LAB — CBC
HCT: 33.2 % — ABNORMAL LOW (ref 39.0–52.0)
Hemoglobin: 11.5 g/dL — ABNORMAL LOW (ref 13.0–17.0)
MCH: 31.3 pg (ref 26.0–34.0)
MCHC: 34.6 g/dL (ref 30.0–36.0)
MCV: 90.5 fL (ref 78.0–100.0)
Platelets: 146 10*3/uL — ABNORMAL LOW (ref 150–400)
RBC: 3.67 MIL/uL — ABNORMAL LOW (ref 4.22–5.81)
RDW: 13.8 % (ref 11.5–15.5)
WBC: 9.6 10*3/uL (ref 4.0–10.5)

## 2017-01-15 LAB — BASIC METABOLIC PANEL
Anion gap: 7 (ref 5–15)
BUN: 14 mg/dL (ref 6–20)
CO2: 26 mmol/L (ref 22–32)
Calcium: 8.3 mg/dL — ABNORMAL LOW (ref 8.9–10.3)
Chloride: 107 mmol/L (ref 101–111)
Creatinine, Ser: 0.82 mg/dL (ref 0.61–1.24)
GFR calc Af Amer: >60 mL/min (ref >60)
GFR calc non Af Amer: >60 mL/min (ref >60)
Glucose, Bld: 102 mg/dL — ABNORMAL HIGH (ref 65–99)
Potassium: 3.7 mmol/L (ref 3.5–5.1)
Sodium: 140 mmol/L (ref 135–145)

## 2017-01-15 NOTE — Progress Notes (Signed)
Physical Therapy Treatment Patient Details Name: Brian Higgins MRN: [id number] DOB: [birth year] Today's Date: 01/15/2017    History of Present Illness s/p L TKA    PT Comments    Pt doing well but became extremely diaphoretic after amb,  Chair to pt to avoid LOC; BP once fully reclined (near supine) 118/64, HR 62, O2 sats 98%; will see again later today; pt hopes to D/C  Follow Up Recommendations  Outpatient PT     Equipment Recommendations  Rolling walker with 5" wheels    Recommendations for Other Services       Precautions / Restrictions Precautions Precautions: Fall;Knee Precaution Comments: IND SLRs Knee Immobilizer - Left: Discontinue once straight leg raise with < 10 degree lag Restrictions Weight Bearing Restrictions: No Other Position/Activity Restrictions: WBAT LLE     Mobility  Bed Mobility Overal bed mobility: Needs Assistance Bed Mobility: Supine to Sit;Sit to Supine     Supine to sit: Supervision Sit to supine: Min assist   General bed mobility comments: assist with LLE   Transfers Overall transfer level: Needs assistance Equipment used: Rolling walker (2 wheeled) Transfers: Sit to/from Stand Sit to Stand: Supervision;Min assist         General transfer comment: cues for hand placement and LLE position; assist to control descent  Ambulation/Gait Ambulation/Gait assistance: Min guard;Supervision Ambulation Distance (Feet): 120 Feet Assistive device: Rolling walker (2 wheeled) Gait Pattern/deviations: Step-to pattern;Antalgic;Step-through pattern;Decreased stride length     General Gait Details: cues for sequence, RW position and step length   Stairs            Wheelchair Mobility    Modified Rankin (Stroke Patients Only)       Balance                                            Cognition Arousal/Alertness: Awake/alert Behavior During Therapy: WFL for tasks assessed/performed Overall Cognitive Status:  Within Functional Limits for tasks assessed                                        Exercises Total Joint Exercises Quad Sets: AROM;Both;5 reps    General Comments        Pertinent Vitals/Pain Pain Assessment: 0-10 Pain Score: 5  Pain Location: L knee  Pain Descriptors / Indicators: Guarding;Grimacing;Sore Pain Intervention(s): Limited activity within patient's tolerance;Monitored during session;Premedicated before session;Ice applied    Home Living                      Prior Function            PT Goals (current goals can now be found in the care plan section) Acute Rehab PT Goals Patient Stated Goal: to get back to doing things on his own  PT Goal Formulation: With patient Time For Goal Achievement: 01/16/17 Potential to Achieve Goals: Good Progress towards PT goals: Progressing toward goals    Frequency    7X/week      PT Plan Current plan remains appropriate    Co-evaluation             End of Session Equipment Utilized During Treatment: Gait belt Activity Tolerance: Patient tolerated treatment well Patient left: in bed;with call bell/phone within reach;with bed alarm set  PT Visit Diagnosis: Difficulty in walking, not elsewhere classified (R26.2)     Time: 3568-6168 PT Time Calculation (min) (ACUTE ONLY): 26 min  Charges:  $Gait Training: 23-37 mins                    G Codes:          Bronc Brosseau Jan 17, 2017, 10:55 AM

## 2017-01-15 NOTE — Progress Notes (Signed)
01/15/17 1400  PT Visit Information  Last PT Received On 01/15/17; feeling much better this pm overall during and after mobility; no dizziness, pain well controlled  Assistance Needed +1  History of Present Illness s/p L TKA  Subjective Data  Patient Stated Goal to get back to doing things on his own   Precautions  Precautions Fall;Knee  Precaution Comments IND SLRs; educated on KI use at night if positioned in s/l  Required Braces or Orthoses Knee Immobilizer - Left  Knee Immobilizer - Left Discontinue once straight leg raise with < 10 degree lag  Restrictions  Other Position/Activity Restrictions WBAT LLE   Pain Assessment  Pain Assessment 0-10  Pain Score 3  Pain Location L knee   Pain Descriptors / Indicators Guarding;Grimacing;Sore  Pain Intervention(s) Limited activity within patient's tolerance;Monitored during session;Premedicated before session  Cognition  Arousal/Alertness Awake/alert  Behavior During Therapy WFL for tasks assessed/performed  Overall Cognitive Status Within Functional Limits for tasks assessed  Bed Mobility  Overal bed mobility Needs Assistance  Bed Mobility Supine to Sit;Sit to Supine  Supine to sit Supervision  Sit to supine Supervision;Modified independent (Device/Increase time)  Transfers  Overall transfer level Needs assistance  Equipment used Rolling walker (2 wheeled)  Transfers Sit to/from Stand  Sit to Stand Supervision  General transfer comment cues for hand placement, pt self corrects  Ambulation/Gait  Ambulation/Gait assistance Supervision;Min guard  Ambulation Distance (Feet) 90 Feet  Assistive device Rolling walker (2 wheeled)  Gait Pattern/deviations Step-to pattern;Antalgic;Step-through pattern;Decreased stride length  General Gait Details cues for sequence, RW position and step length  Stairs Yes  Stairs assistance Min assist;Min guard  Stair Management One rail Right;Step to pattern;With crutches;Forwards  Number of Stairs  3 (x2)  General stair comments cues for sequence  Total Joint Exercises  Ankle Circles/Pumps AROM;Both;10 reps  Quad Sets AROM;Both;5 reps  Knee Flexion AAROM;Left;AROM;5 reps;Seated  Goniometric ROM ~ 10 to 84* AA flexion in sitting  PT - End of Session  Equipment Utilized During Treatment Gait belt  Activity Tolerance Patient tolerated treatment well  Patient left Other (comment);with family/visitor present (in bathroom with wife present, RN aware)  PT - Assessment/Plan  PT Plan Current plan remains appropriate  PT Visit Diagnosis Difficulty in walking, not elsewhere classified (R26.2)  PT Frequency (ACUTE ONLY) 7X/week  Follow Up Recommendations Outpatient PT  PT equipment Rolling walker with 5" wheels  AM-PAC PT "6 Clicks" Daily Activity Outcome Measure  Difficulty turning over in bed (including adjusting bedclothes, sheets and blankets)? 3  Difficulty moving from lying on back to sitting on the side of the bed?  3  Difficulty sitting down on and standing up from a chair with arms (e.g., wheelchair, bedside commode, etc,.)? 3  Help needed moving to and from a bed to chair (including a wheelchair)? 3  Help needed walking in hospital room? 3  Help needed climbing 3-5 steps with a railing?  3  6 Click Score 18  Mobility G Code  CK  PT Goal Progression  Progress towards PT goals Progressing toward goals  Acute Rehab PT Goals  PT Goal Formulation With patient  Time For Goal Achievement 01/16/17  Potential to Achieve Goals Good  PT Time Calculation  PT Start Time (ACUTE ONLY) 1309  PT Stop Time (ACUTE ONLY) 1347  PT Time Calculation (min) (ACUTE ONLY) 38 min  PT General Charges  $$ ACUTE PT VISIT 1 Procedure  PT Treatments  $Gait Training 8-22 mins  $Therapeutic Exercise 8-22 mins

## 2017-01-15 NOTE — Progress Notes (Signed)
   Subjective: 2 Days Post-Op Procedure(s) (LRB): LEFT TOTAL KNEE ARTHROPLASTY (Left) Patient reports pain as mild.   Patient seen in rounds with Dr. Wynelle Link. Patient is well, but has had some minor complaints of pain in the knee, requiring pain medications Patient is ready to go home  Objective: Vital signs in last 24 hours: Temp:  [97.9 F (36.6 C)-98.4 F (36.9 C)] 97.9 F (36.6 C) (04/25 0542) Pulse Rate:  [66-68] 66 (04/25 0542) Resp:  [16] 16 (04/25 0542) BP: (114-140)/(46-73) 136/73 (04/25 0542) SpO2:  [95 %-98 %] 95 % (04/25 0542)  Intake/Output from previous day:  Intake/Output Summary (Last 24 hours) at 01/15/17 0707 Last data filed at 01/14/17 2200  Gross per 24 hour  Intake              840 ml  Output             1400 ml  Net             -560 ml    Intake/Output this shift: No intake/output data recorded.  Labs:  Recent Labs  01/14/17 0420 01/15/17 0429  HGB 12.6* 11.5*    Recent Labs  01/14/17 0420 01/15/17 0429  WBC 9.9 9.6  RBC 3.96* 3.67*  HCT 36.4* 33.2*  PLT 166 146*    Recent Labs  01/14/17 0420 01/15/17 0429  NA 137 140  K 4.0 3.7  CL 106 107  CO2 24 26  BUN 16 14  CREATININE 0.90 0.82  GLUCOSE 143* 102*  CALCIUM 8.5* 8.3*   No results for input(s): LABPT, INR in the last 72 hours.  EXAM: General - Patient is Alert and Appropriate Extremity - Neurovascular intact Sensation intact distally Incision - clean, dry, no drainage Motor Function - intact, moving foot and toes well on exam.   Assessment/Plan: 2 Days Post-Op Procedure(s) (LRB): LEFT TOTAL KNEE ARTHROPLASTY (Left) Procedure(s) (LRB): LEFT TOTAL KNEE ARTHROPLASTY (Left) Past Medical History:  Diagnosis Date  . Allergy   . Arthritis   . GERD (gastroesophageal reflux disease)    mild no meds  . Hypertension   . Prostate cancer ([place])    Principal Problem:   OA (osteoarthritis) of knee  Estimated body mass index is 33.13 kg/m as calculated from the  following:   Height as of this encounter: 6\' 2"  (1.88 m).   Weight as of this encounter: 117 kg (258 lb). Up with therapy Diet - Cardiac diet Follow up - in 2 weeks Activity - WBAT Disposition - Home Condition Upon Discharge - Good D/C Meds - See DC Summary DVT Prophylaxis - Xarelto  Arlee Muslim, PA-C Orthopaedic Surgery 01/15/2017, 7:07 AM

## 2017-01-21 ENCOUNTER — Other Ambulatory Visit: Payer: Self-pay | Admitting: *Deleted

## 2017-01-21 ENCOUNTER — Encounter: Payer: Self-pay | Admitting: *Deleted

## 2017-01-21 NOTE — Patient Outreach (Signed)
[place] [facility]) Care Management  01/21/2017  [name] [birth year] [id number]   Subjective: Telephone call to patient's home number, no answer, left HIPAA compliant voicemail message, and requested call back.    Objective: Per Sunny Schlein, KPN (point of care tool), and chart review: patient hospitalized 01/13/17 -01/15/17 for Osteoarthritis  Left knee.   Status post Left  Total Knee Arthroplasty on 01/13/17.  Patient also has a history of hypertension and prostate cancer.   Assessment: Received [employer] Transition of care referral on 01/20/17.  Transition of care follow up pending patient contact.     Plan: RNCM will call patient for 2nd telephone outreach attempt, transition of care follow up, within 10 business days if no return call.    Yuliya Nova H. Annia Friendly, BSN, [place] Management [facility] Telephonic CM Phone: [phone] Fax: [phone]

## 2017-01-21 NOTE — Patient Outreach (Addendum)
[place] [facility]) Care Management  01/21/2017  Brian Higgins 07-06-1956 [id number]   Subjective: Received voicemail message from patient, states he is returning call, and requested call back on mobile number [phone]).  Telephone call to patient's mobile number, spoke with patient, and HIPAA verified.  Discussed [facility] Care Management [employer] Transition of care follow up, patient voiced understanding, and is in agreement to follow up.   Patient states he is doing pretty good, attending outpatient rehab as prescribed, has a follow up appointment with surgeon on 01/27/17, and is working daily a few hours at home.    Patient states he is very impressed with all the hospital staff, remember everyone's name, was well taken care of, and is planning to send a thank you note to the unit manager to share with the staff.  Patient states he does not have any transition of care, care coordination, disease management, disease monitoring, transportation, community resource, or pharmacy needs at this time.  States she is very appreciative of the follow up and is in agreement to receive [name].    Objective: Per Brian Higgins, KPN (point of care tool), and chart review: patient hospitalized 01/13/17 -01/15/17 for [place]. Status post LeftTotal Knee Arthroplasty on 01/13/17.  Patient also has a history of hypertension and prostate cancer.   Assessment: Received [employer] Transition of care referral on 01/20/17. Transition of care follow up completed, no care management needs, and will proceed with case closure.     Plan: RNCM will send patient successful outreach letter, [facility] pamphlet, and magnet. RNCM will send case closure due to follow up completed / no care management needs request to [name] [place] [facility].     Brian Higgins H. Annia Friendly, BSN, [place] Management [facility] Telephonic CM Phone: [phone] Fax: [phone]

## 2017-01-22 ENCOUNTER — Ambulatory Visit: Payer: Self-pay | Admitting: *Deleted

## 2017-01-26 ENCOUNTER — Other Ambulatory Visit: Payer: Self-pay | Admitting: Family Medicine

## 2017-02-21 NOTE — Addendum Note (Signed)
Addendum  created 02/21/17 1253 by Effie Berkshire, MD   Sign clinical note

## 2017-02-24 ENCOUNTER — Other Ambulatory Visit: Payer: Self-pay | Admitting: Family Medicine

## 2017-03-01 ENCOUNTER — Other Ambulatory Visit: Payer: Self-pay | Admitting: Family Medicine

## 2017-03-05 ENCOUNTER — Telehealth: Payer: Self-pay | Admitting: Family Medicine

## 2017-03-05 NOTE — Telephone Encounter (Signed)
Pt calling wondering why he only got 15 pills I advised him that he need a ov pt refused stating that he just been to Doctor several time that Dr Carlota Raspberry can see that and that he had blood work done there I advised him that Dr Carlota Raspberry is his PCP and that he may not need blood work he just want to discuss medicine and how he is doing I also told pt that I called and left a message for him to call us about a hospital f/u he still said that he didn't need to come into the office then for a f/u of hospital that he went come where else pt would like for Dr Nyoka Cowden to give him a call to get more refills on Hydrochlorothiazide

## 2017-03-07 NOTE — Telephone Encounter (Signed)
Clarification noted: Three different scripts were sent for HCTZ 12.mg tab Corrections made- Fill script dated 03/01/17 for 30 day supply Pt advised when he completes 15 tablets, refill #15 will be given

## 2017-04-28 ENCOUNTER — Encounter: Payer: Self-pay | Admitting: Family Medicine

## 2017-04-28 ENCOUNTER — Ambulatory Visit (INDEPENDENT_AMBULATORY_CARE_PROVIDER_SITE_OTHER): Payer: Managed Care, Other (non HMO) | Admitting: Family Medicine

## 2017-04-28 VITALS — BP 139/81 | HR 60 | Temp 97.7°F | Resp 16 | Ht 73.0 in | Wt 258.0 lb

## 2017-04-28 DIAGNOSIS — Z131 Encounter for screening for diabetes mellitus: Secondary | ICD-10-CM | POA: Diagnosis not present

## 2017-04-28 DIAGNOSIS — R739 Hyperglycemia, unspecified: Secondary | ICD-10-CM | POA: Diagnosis not present

## 2017-04-28 DIAGNOSIS — Z13220 Encounter for screening for lipoid disorders: Secondary | ICD-10-CM

## 2017-04-28 DIAGNOSIS — I10 Essential (primary) hypertension: Secondary | ICD-10-CM

## 2017-04-28 MED ORDER — HYDROCHLOROTHIAZIDE 12.5 MG PO CAPS: ORAL_CAPSULE | ORAL | 1 refills | Status: DC

## 2017-04-28 NOTE — Progress Notes (Signed)
By signing my name below, I, Brian Higgins, attest that this documentation has been prepared under the direction and in the presence of Brian Ray, MD.  Electronically Signed: Verlee Higgins, Medical Scribe. 04/28/17. 9:34 AM.  Subjective:    Patient ID: Brian Higgins, male    DOB: 1956/09/12, 61 y.o.   MRN: [id number]  HPI Chief Complaint  Patient presents with  . Medication Refill    hctz    HPI Comments: Brian Higgins is a 61 y.o. male who presents to Primary Care at [facility] for HTN follow-up. He is status post left total knee in April, Dr. Wynelle Link.  Pt is fasting.  HTN: Takes HCTZ 12.5 mg QD. Pt ran out Thursday (4 days ago). Pt's bp runs around 127s/75s when he's on HCTZ. Denies chest pain, light-headedness, HAs, melena, bloody stool,  experiencing negative side effects from HCTZ.  Lab Results  Component Value Date   CREATININE 0.82 01/15/2017   BP Readings from Last 3 Encounters:  04/28/17 139/81  01/15/17 (!) 148/68  01/06/17 (!) 150/79   Knee Surgery: Reports his knee is doing well. Elliptical 20 mins, and bike 30 mins. Pt lifts light weights daily. Pt walked up +245 steps when he went to a waterfall after 11 weeks of surgery - "tougher going down than going up". That same day he rode his bike 8 mi. He plans on playing tennis soon.  Vision: Pt has cataracts surgery on his left eye - went from 40% to 85% in a year. It's now effecting his night driving.  Patient Active Problem List   Diagnosis Date Noted  . OA (osteoarthritis) of knee 01/13/2017  . Prostate cancer ([place]) 11/21/2012   Past Medical History:  Diagnosis Date  . Allergy   . Arthritis   . GERD (gastroesophageal reflux disease)    mild no meds  . Hypertension   . Prostate cancer [facility])    Past Surgical History:  Procedure Laterality Date  . HAND TENDON SURGERY     Right  arm  . PROSTATECTOMY    . ROTATOR CUFF REPAIR Bilateral left 2007 and right 09/2009  . TOTAL KNEE ARTHROPLASTY Left 01/13/2017    Procedure: LEFT TOTAL KNEE ARTHROPLASTY;  Surgeon: Gaynelle Arabian, MD;  Location: [facility] ORS;  Service: Orthopedics;  Laterality: Left;   Allergies  Allergen Reactions  . Other     Catgut suture did not dissolve--causes infection (2007)   Prior to Admission medications   Medication Sig Start Date End Date Taking? Authorizing Provider  acetaminophen (TYLENOL) 500 MG tablet Take 500-1,000 mg by mouth 2 (two) times daily as needed (for pain.).   Yes [provider]  cetirizine (ZYRTEC) 10 MG tablet Take 10 mg by mouth daily as needed for allergies (for seasonal allergies (pt. takes in Spring & Fall)).    Yes [provider]  hydrochlorothiazide (MICROZIDE) 12.5 MG capsule TAKE 1 CAPSULE BY MOUTH EVERY DAY. NEEDS OFFICE VISIT FOR ADDITIONAL REFILLS 03/01/17  Yes Wendie Agreste, MD  gabapentin (NEURONTIN) 300 MG capsule Take 1 capsule (300 mg total) by mouth 3 (three) times daily. Gabapentin 300 mg Protocol Take a 300 mg capsule three times a day for one week, Then a 300 mg capsule twice a day for one week, Then a 300 mg capsule once a day for one week, then discontinue the Gabapentin. Patient not taking: Reported on 04/28/2017 01/14/17   Dara Lords, Alexzandrew L, PA-C  methocarbamol (ROBAXIN) 500 MG tablet Take 1 tablet (500 mg total) by mouth  every 6 (six) hours as needed for muscle spasms. Patient not taking: Reported on 04/28/2017 01/14/17   Dara Lords, Alexzandrew L, PA-C  oxyCODONE (OXY IR/ROXICODONE) 5 MG immediate release tablet Take 1-2 tablets (5-10 mg total) by mouth every 4 (four) hours as needed for moderate pain or severe pain. Patient not taking: Reported on 04/28/2017 01/14/17   Dara Lords, Alexzandrew L, PA-C  Propylene Glycol (SYSTANE BALANCE) 0.6 % SOLN Place 1 drop into both eyes 2 (two) times daily as needed (for allergies).    [provider]  rivaroxaban (XARELTO) 10 MG TABS tablet Take 1 tablet (10 mg total) by mouth daily with breakfast. Take Xarelto for two and a  half more weeks following discharge from the hospital, then discontinue Xarelto. Once the patient has completed the blood thinner regimen, then take a Baby 81 mg Aspirin daily for three more weeks. Patient not taking: Reported on 04/28/2017 01/15/17   Dara Lords, Alexzandrew L, PA-C  traMADol (ULTRAM) 50 MG tablet Take 1-2 tablets (50-100 mg total) by mouth every 6 (six) hours as needed for moderate pain. Patient not taking: Reported on 04/28/2017 01/14/17   Joelene Millin, PA-C   Social History   Social History  . Marital status: Married    Spouse name: N/A  . Number of children: N/A  . Years of education: N/A   Occupational History  . Not on file.   Social History Main Topics  . Smoking status: Never Smoker  . Smokeless tobacco: Never Used  . Alcohol use 3.6 - 4.2 oz/week    4 Standard drinks or equivalent, 2 - 3 Glasses of wine per week     Comment: x a week  . Drug use: No  . Sexual activity: Yes   Other Topics Concern  . Not on file   Social History Narrative  . No narrative on file   Review of Systems  Constitutional: Negative for fatigue and unexpected weight change.  Eyes: Positive for visual disturbance (catracts).  Respiratory: Negative for cough, chest tightness and shortness of breath.   Cardiovascular: Negative for chest pain, palpitations and leg swelling.  Gastrointestinal: Negative for abdominal pain and blood in stool.  Musculoskeletal: Negative for arthralgias.  Neurological: Negative for dizziness, light-headedness and headaches.   Objective:  Physical Exam  Constitutional: He is oriented to person, place, and time. He appears well-developed and well-nourished.  HENT:  Head: Normocephalic and atraumatic.  Eyes: Pupils are equal, round, and reactive to light. EOM are normal.  Neck: No JVD present. Carotid bruit is not present.  Cardiovascular: Normal rate, regular rhythm and normal heart sounds.  Exam reveals no gallop and no friction rub.   No murmur  heard. Pulmonary/Chest: Effort normal and breath sounds normal. No respiratory distress. He has no wheezes. He has no rales.  Musculoskeletal: He exhibits no edema.  Neurological: He is alert and oriented to person, place, and time.  Skin: Skin is warm and dry.  Psychiatric: He has a normal mood and affect.  Vitals reviewed.   Vitals:   04/28/17 0911  BP: 139/81  Pulse: 60  Resp: 16  Temp: 97.7 F (36.5 C)  TempSrc: Oral  SpO2: 95%  Weight: 258 lb (117 kg)  Height: 6\' 1"  (1.854 m)   Body mass index is 34.04 kg/m. Assessment & Plan:   Eldra Word is a 61 y.o. male Essential hypertension - Plan: hydrochlorothiazide (MICROZIDE) 12.5 MG capsule, Comprehensive metabolic panel  -Off meds past few days, stable readings by report when he  was taking HCTZ, tolerating medication well, no changes. Labs pending  Screening for diabetes mellitus - Plan: Hemoglobin A1c Hyperglycemia - Plan: Comprehensive metabolic panel, Hemoglobin A1c  - Borderline elevated glucose prior, Labs pending  Screening for hyperlipidemia - Plan: Comprehensive metabolic panel, Lipid panel   Plan for follow-up in 6 months for physical. He appears to be doing well from his recent knee replacement.  Meds ordered this encounter  Medications  . hydrochlorothiazide (MICROZIDE) 12.5 MG capsule    Sig: TAKE 1 CAPSULE BY MOUTH EVERY DAY.    Dispense:  90 capsule    Refill:  1   Patient Instructions    No change in meds for now. I will check some electrolytes today including blood sugar as that was slightly elevated prior.   Recheck in 6 months for a physical  IF you received an x-Higgins today, you will receive an invoice from [facility]. Please contact [facility] at [phone] with questions or concerns regarding your invoice.   IF you received labwork today, you will receive an invoice from [facility]. Please contact [facility] at [phone] with questions or concerns regarding your invoice.    Our billing staff will not be able to assist you with questions regarding bills from these companies.  You will be contacted with the lab results as soon as they are available. The fastest way to get your results is to activate your My Chart account. Instructions are located on the last page of this paperwork. If you have not heard from [place] regarding the results in 2 weeks, please contact this office.       I personally performed the services described in this documentation, which was scribed in my presence. The recorded information has been reviewed and considered for accuracy and completeness, addended by me as needed, and agree with information above.  Signed,   Brian Ray, MD Primary [facility] at [place].  04/28/17 10:41 AM

## 2017-04-28 NOTE — Patient Instructions (Addendum)
  No change in meds for now. I will check some electrolytes today including blood sugar as that was slightly elevated prior.   Recheck in 6 months for a physical  IF you received an x-ray today, you will receive an invoice from [facility]. Please contact [facility]iology at [phone] with questions or concerns regarding your invoice.   IF you received labwork today, you will receive an invoice from [place]. Please contact [facility] at [phone] with questions or concerns regarding your invoice.   Our billing staff will not be able to assist you with questions regarding bills from these companies.  You will be contacted with the lab results as soon as they are available. The fastest way to get your results is to activate your My Chart account. Instructions are located on the last page of this paperwork. If you have not heard from [place] regarding the results in 2 weeks, please contact this office.

## 2017-04-29 LAB — LIPID PANEL
Chol/HDL Ratio: 4.1 ratio (ref 0.0–5.0)
Cholesterol, Total: 199 mg/dL (ref 100–199)
HDL: 48 mg/dL (ref >39)
LDL Calculated: 123 mg/dL — ABNORMAL HIGH (ref 0–99)
Triglycerides: 138 mg/dL (ref 0–149)
VLDL Cholesterol Cal: 28 mg/dL (ref 5–40)

## 2017-04-29 LAB — HEMOGLOBIN A1C
Est. average glucose Bld gHb Est-mCnc: 117 mg/dL
Hgb A1c MFr Bld: 5.7 % — ABNORMAL HIGH (ref 4.8–5.6)

## 2017-04-29 LAB — COMPREHENSIVE METABOLIC PANEL
ALT: 16 IU/L (ref 0–44)
AST: 19 IU/L (ref 0–40)
Albumin/Globulin Ratio: 2.1 (ref 1.2–2.2)
Albumin: 4.6 g/dL (ref 3.6–4.8)
Alkaline Phosphatase: 54 IU/L (ref 39–117)
BUN/Creatinine Ratio: 18 (ref 10–24)
BUN: 16 mg/dL (ref 8–27)
Bilirubin Total: 0.8 mg/dL (ref 0.0–1.2)
CO2: 21 mmol/L (ref 20–29)
Calcium: 9.5 mg/dL (ref 8.6–10.2)
Chloride: 106 mmol/L (ref 96–106)
Creatinine, Ser: 0.87 mg/dL (ref 0.76–1.27)
GFR calc Af Amer: 108 mL/min/{1.73_m2} (ref >59)
GFR calc non Af Amer: 94 mL/min/{1.73_m2} (ref >59)
Globulin, Total: 2.2 g/dL (ref 1.5–4.5)
Glucose: 99 mg/dL (ref 65–99)
Potassium: 4.4 mmol/L (ref 3.5–5.2)
Sodium: 142 mmol/L (ref 134–144)
Total Protein: 6.8 g/dL (ref 6.0–8.5)

## 2017-09-27 ENCOUNTER — Ambulatory Visit: Payer: Managed Care, Other (non HMO) | Admitting: Family Medicine

## 2017-09-27 ENCOUNTER — Other Ambulatory Visit: Payer: Self-pay

## 2017-09-27 ENCOUNTER — Encounter: Payer: Self-pay | Admitting: Family Medicine

## 2017-09-27 VITALS — BP 132/78 | HR 59 | Temp 97.9°F | Resp 16 | Ht 74.8 in | Wt 259.0 lb

## 2017-09-27 DIAGNOSIS — J029 Acute pharyngitis, unspecified: Secondary | ICD-10-CM

## 2017-09-27 DIAGNOSIS — R509 Fever, unspecified: Secondary | ICD-10-CM | POA: Diagnosis not present

## 2017-09-27 DIAGNOSIS — R05 Cough: Secondary | ICD-10-CM | POA: Diagnosis not present

## 2017-09-27 DIAGNOSIS — R059 Cough, unspecified: Secondary | ICD-10-CM

## 2017-09-27 DIAGNOSIS — J22 Unspecified acute lower respiratory infection: Secondary | ICD-10-CM

## 2017-09-27 LAB — POCT RAPID STREP A (OFFICE): Rapid Strep A Screen: NEGATIVE

## 2017-09-27 LAB — POC INFLUENZA A&B (BINAX/QUICKVUE)
Influenza A, POC: NEGATIVE
Influenza B, POC: NEGATIVE

## 2017-09-27 MED ORDER — HYDROCODONE-HOMATROPINE 5-1.5 MG/5ML PO SYRP: ORAL_SOLUTION | ORAL | 0 refills | Status: DC

## 2017-09-27 MED ORDER — AZITHROMYCIN 250 MG PO TABS
ORAL_TABLET | ORAL | 0 refills | Status: DC
Start: 2017-09-27 — End: 2018-05-22

## 2017-09-27 NOTE — Patient Instructions (Addendum)
Strep test and flu test in the office were normal or negative. Are likely due to a virus at this point, but if you are not starting to improve the next few days, I did print the antibiotic azithromycin. If you have continued worsening including worsening fevers or shortness of breath, return for possible other testing. For cough, can take over-the-counter Mucinex or the prescription cough syrup that was prescribed today. For sore throat try lozenges such as Cepacol, and make sure you drink plenty fluids. Tylenol if needed for fever, headache, or body aches.   Return to the clinic or go to the nearest emergency room if any of your symptoms worsen or new symptoms occur.  Cough, Adult Coughing is a reflex that clears your throat and your airways. Coughing helps to heal and protect your lungs. It is normal to cough occasionally, but a cough that happens with other symptoms or lasts a long time may be a sign of a condition that needs treatment. A cough may last only 2-3 weeks (acute), or it may last longer than 8 weeks (chronic). What are the causes? Coughing is commonly caused by:  Breathing in substances that irritate your lungs.  A viral or bacterial respiratory infection.  Allergies.  Asthma.  Postnasal drip.  Smoking.  Acid backing up from the stomach into the esophagus (gastroesophageal reflux).  Certain medicines.  Chronic lung problems, including COPD (or rarely, lung cancer).  Other medical conditions such as heart failure.  Follow these instructions at home: Pay attention to any changes in your symptoms. Take these actions to help with your discomfort:  Take medicines only as told by your health care provider. ? If you were prescribed an antibiotic medicine, take it as told by your health care provider. Do not stop taking the antibiotic even if you start to feel better. ? Talk with your health care provider before you take a cough suppressant medicine.  Drink enough fluid to  keep your urine clear or pale yellow.  If the air is dry, use a cold steam vaporizer or humidifier in your bedroom or your home to help loosen secretions.  Avoid anything that causes you to cough at work or at home.  If your cough is worse at night, try sleeping in a semi-upright position.  Avoid cigarette smoke. If you smoke, quit smoking. If you need help quitting, ask your health care provider.  Avoid caffeine.  Avoid alcohol.  Rest as needed.  Contact a health care provider if:  You have new symptoms.  You cough up pus.  Your cough does not get better after 2-3 weeks, or your cough gets worse.  You cannot control your cough with suppressant medicines and you are losing sleep.  You develop pain that is getting worse or pain that is not controlled with pain medicines.  You have a fever.  You have unexplained weight loss.  You have night sweats. Get help right away if:  You cough up blood.  You have difficulty breathing.  Your heartbeat is very fast. This information is not intended to replace advice given to you by your health care provider. Make sure you discuss any questions you have with your health care provider. Document Released: 03/08/2011 Document Revised: 02/15/2016 Document Reviewed: 11/16/2014 Elsevier Interactive Patient Education  2018 [place].   Upper Respiratory Infection, Adult Most upper respiratory infections (URIs) are caused by a virus. A URI affects the nose, throat, and upper air passages. The most common type of URI is  often called "the common cold." Follow these instructions at home:  Take medicines only as told by your doctor.  Gargle warm saltwater or take cough drops to comfort your throat as told by your doctor.  Use a warm mist humidifier or inhale steam from a shower to increase air moisture. This may make it easier to breathe.  Drink enough fluid to keep your pee (urine) clear or pale yellow.  Eat soups and other clear  broths.  Have a healthy diet.  Rest as needed.  Go back to work when your fever is gone or your doctor says it is okay. ? You may need to stay home longer to avoid giving your URI to others. ? You can also wear a face mask and wash your hands often to prevent spread of the virus.  Use your inhaler more if you have asthma.  Do not use any tobacco products, including cigarettes, chewing tobacco, or electronic cigarettes. If you need help quitting, ask your doctor. Contact a doctor if:  You are getting worse, not better.  Your symptoms are not helped by medicine.  You have chills.  You are getting more short of breath.  You have brown or red mucus.  You have yellow or brown discharge from your nose.  You have pain in your face, especially when you bend forward.  You have a fever.  You have puffy (swollen) neck glands.  You have pain while swallowing.  You have white areas in the back of your throat. Get help right away if:  You have very bad or constant: ? Headache. ? Ear pain. ? Pain in your forehead, behind your eyes, and over your cheekbones (sinus pain). ? Chest pain.  You have long-lasting (chronic) lung disease and any of the following: ? Wheezing. ? Long-lasting cough. ? Coughing up blood. ? A change in your usual mucus.  You have a stiff neck.  You have changes in your: ? Vision. ? Hearing. ? Thinking. ? Mood. This information is not intended to replace advice given to you by your health care provider. Make sure you discuss any questions you have with your health care provider. Document Released: 02/26/2008 Document Revised: 05/12/2016 Document Reviewed: 12/15/2013 Elsevier Interactive Patient Education  2018 [employer].   IF you received an x-ray today, you will receive an invoice from [facility] Radiology. Please contact [facility] Radiology at [phone] with questions or concerns regarding your invoice.   IF you received labwork today, you  will receive an invoice from [place]. Please contact [facility] at [phone] with questions or concerns regarding your invoice.   Our billing staff will not be able to assist you with questions regarding bills from these companies.  You will be contacted with the lab results as soon as they are available. The fastest way to get your results is to activate your My Chart account. Instructions are located on the last page of this paperwork. If you have not heard from [place] regarding the results in 2 weeks, please contact this office.

## 2017-09-27 NOTE — Progress Notes (Signed)
Subjective:  By signing my name below, I, Moises Blood, attest that this documentation has been prepared under the direction and in the presence of [name], MD. Electronically Signed: Moises Blood, [place]. 09/27/2017 , 8:49 AM .  Patient was seen in Room 10 .   Patient ID: Brian Higgins, male    DOB: 1956/03/08, 62 y.o.   MRN: [id number] Chief Complaint  Patient presents with  . Sore Throat    with chronic cough x 4 days   . Headache   HPI Brian Higgins is a 62 y.o. male  Patient states his illness initially started with a bad headache 4 days ago, worsened with chest congestion and then sore throat. He's noticed coughing up brown discolored mucus. He informs sleeping in a chair because he coughs more when laying supine. He reports cough keeping him up at night for the past 2 nights. He measured a fever (Tmax 101) with chills and cold sweats last night. He took tylenol 2 hours prior to office visit today. He denies receiving flu shot this season.   He mentions sick contact at home (son and his wife), as he went skiing in [place] recently with his family. He had knee replacement done in April 4970; no complications with skiing.   Patient Active Problem List   Diagnosis Date Noted  . OA (osteoarthritis) of knee 01/13/2017  . Prostate cancer ([place]) 11/21/2012   Past Medical History:  Diagnosis Date  . Allergy   . Arthritis   . GERD (gastroesophageal reflux disease)    mild no meds  . Hypertension   . Prostate cancer [facility])    Past Surgical History:  Procedure Laterality Date  . HAND TENDON SURGERY     Right  arm  . PROSTATECTOMY    . ROTATOR CUFF REPAIR Bilateral left 2007 and right 09/2009  . TOTAL KNEE ARTHROPLASTY Left 01/13/2017   Procedure: LEFT TOTAL KNEE ARTHROPLASTY;  Surgeon: Gaynelle Arabian, MD;  Location: [facility] ORS;  Service: Orthopedics;  Laterality: Left;   Allergies  Allergen Reactions  . Other     Catgut suture did not dissolve--causes infection (2007)    Prior to Admission medications   Medication Sig Start Date End Date Taking? Authorizing Provider  acetaminophen (TYLENOL) 500 MG tablet Take 500-1,000 mg by mouth 2 (two) times daily as needed (for pain.).    [provider]  cetirizine (ZYRTEC) 10 MG tablet Take 10 mg by mouth daily as needed for allergies (for seasonal allergies (pt. takes in Spring & Fall)).     [provider]  hydrochlorothiazide (MICROZIDE) 12.5 MG capsule TAKE 1 CAPSULE BY MOUTH EVERY DAY. 04/28/17   Wendie Agreste, MD  Propylene Glycol (SYSTANE BALANCE) 0.6 % SOLN Place 1 drop into both eyes 2 (two) times daily as needed (for allergies).    [provider]   Social History   Socioeconomic History  . Marital status: Married    Spouse name: Not on file  . Number of children: Not on file  . Years of education: Not on file  . Highest education level: Not on file  Social Needs  . Financial resource strain: Not on file  . Food insecurity - worry: Not on file  . Food insecurity - inability: Not on file  . Transportation needs - medical: Not on file  . Transportation needs - non-medical: Not on file  Occupational History  . Not on file  Tobacco Use  . Smoking status: Never Smoker  . Smokeless  tobacco: Never Used  Substance and Sexual Activity  . Alcohol use: Yes    Alcohol/week: 3.6 - 4.2 oz    Types: 4 Standard drinks or equivalent, 2 - 3 Glasses of wine per week    Comment: x a week  . Drug use: No  . Sexual activity: Yes  Other Topics Concern  . Not on file  Social History Narrative  . Not on file   Review of Systems  Constitutional: Positive for chills, fatigue and fever.  HENT: Positive for congestion and sore throat. Negative for rhinorrhea, trouble swallowing and voice change.   Respiratory: Positive for cough. Negative for chest tightness, wheezing and stridor.   Neurological: Positive for headaches.       Objective:   Physical Exam  Constitutional: He is  oriented to person, place, and time. He appears well-developed and well-nourished. No distress.  HENT:  Head: Normocephalic and atraumatic.  Mouth/Throat: No oropharyngeal exudate or posterior oropharyngeal erythema.  No significant oropharynx erythema, no exudate   Eyes: EOM are normal. Pupils are equal, round, and reactive to light.  Neck: Neck supple.  minimal tenderness anterior neck without enlarged nodes  Cardiovascular: Normal rate.  Pulmonary/Chest: Effort normal and breath sounds normal. No respiratory distress.  Musculoskeletal: Normal range of motion.  Neurological: He is alert and oriented to person, place, and time.  Skin: Skin is warm and dry.  Psychiatric: He has a normal mood and affect. His behavior is normal.  Nursing note and vitals reviewed.   Vitals:   09/27/17 0831  BP: 132/78  Pulse: (!) 59  Resp: 16  Temp: 97.9 F (36.6 C)  TempSrc: Oral  SpO2: 96%  Weight: 259 lb (117.5 kg)  Height: 6' 2.8" (1.9 m)   Results for orders placed or performed in visit on 09/27/17  POCT rapid strep A  Result Value Ref Range   Rapid Strep A Screen Negative Negative  POC Influenza A&B(BINAX/QUICKVUE)  Result Value Ref Range   Influenza A, POC Negative Negative   Influenza B, POC Negative Negative       Assessment & Plan:    Jasman Murri is a 62 y.o. male LRTI (lower respiratory tract infection) - Plan: azithromycin (ZITHROMAX) 250 MG tablet  Sore throat - Plan: POCT rapid strep A  Cough - Plan: POC Influenza A&B(BINAX/QUICKVUE), HYDROcodone-homatropine (HYCODAN) 5-1.5 MG/5ML syrup  Fever, unspecified - Plan: POCT rapid strep A, POC Influenza A&B(BINAX/QUICKVUE)  Likely viral illness with cough, sick contacts. Possible influenza-like illness with false negative flu, but now on day 4. Afebrile, reassuring exam and vitals.  -Symptomatic care discussed including Mucinex, sore throat lozenges, hydrocodone cough syrup if needed.  -Will be potentially traveling out of  town due to parent in [place]. Prescription provided for azithromycin if not improving into next week with cough/lower respiratory symptoms. RTC precautions if worsening  Meds ordered this encounter  Medications  . HYDROcodone-homatropine (HYCODAN) 5-1.5 MG/5ML syrup    Sig: 80m by mouth a bedtime as needed for cough.    Dispense:  120 mL    Refill:  0  . azithromycin (ZITHROMAX) 250 MG tablet    Sig: Take 2 pills by mouth on day 1, then 1 pill by mouth per day on days 2 through 5.    Dispense:  6 tablet    Refill:  0   Patient Instructions   Strep test and flu test in the office were normal or negative. Are likely due to a virus at this point, but if  you are not starting to improve the next few days, I did print the antibiotic azithromycin. If you have continued worsening including worsening fevers or shortness of breath, return for possible other testing. For cough, can take over-the-counter Mucinex or the prescription cough syrup that was prescribed today. For sore throat try lozenges such as Cepacol, and make sure you drink plenty fluids. Tylenol if needed for fever, headache, or body aches.   Return to the clinic or go to the nearest emergency room if any of your symptoms worsen or new symptoms occur.  Cough, Adult Coughing is a reflex that clears your throat and your airways. Coughing helps to heal and protect your lungs. It is normal to cough occasionally, but a cough that happens with other symptoms or lasts a long time may be a sign of a condition that needs treatment. A cough may last only 2-3 weeks (acute), or it may last longer than 8 weeks (chronic). What are the causes? Coughing is commonly caused by:  Breathing in substances that irritate your lungs.  A viral or bacterial respiratory infection.  Allergies.  Asthma.  Postnasal drip.  Smoking.  Acid backing up from the stomach into the esophagus (gastroesophageal reflux).  Certain medicines.  Chronic lung problems,  including COPD (or rarely, lung cancer).  Other medical conditions such as heart failure.  Follow these instructions at home: Pay attention to any changes in your symptoms. Take these actions to help with your discomfort:  Take medicines only as told by your health care provider. ? If you were prescribed an antibiotic medicine, take it as told by your health care provider. Do not stop taking the antibiotic even if you start to feel better. ? Talk with your health care provider before you take a cough suppressant medicine.  Drink enough fluid to keep your urine clear or pale yellow.  If the air is dry, use a cold steam vaporizer or humidifier in your bedroom or your home to help loosen secretions.  Avoid anything that causes you to cough at work or at home.  If your cough is worse at night, try sleeping in a semi-upright position.  Avoid cigarette smoke. If you smoke, quit smoking. If you need help quitting, ask your health care provider.  Avoid caffeine.  Avoid alcohol.  Rest as needed.  Contact a health care provider if:  You have new symptoms.  You cough up pus.  Your cough does not get better after 2-3 weeks, or your cough gets worse.  You cannot control your cough with suppressant medicines and you are losing sleep.  You develop pain that is getting worse or pain that is not controlled with pain medicines.  You have a fever.  You have unexplained weight loss.  You have night sweats. Get help right away if:  You cough up blood.  You have difficulty breathing.  Your heartbeat is very fast. This information is not intended to replace advice given to you by your health care provider. Make sure you discuss any questions you have with your health care provider. Document Released: 03/08/2011 Document Revised: 02/15/2016 Document Reviewed: 11/16/2014 Elsevier Interactive Patient Education  2018 [place].   Upper Respiratory Infection, Adult Most upper  respiratory infections (URIs) are caused by a virus. A URI affects the nose, throat, and upper air passages. The most common type of URI is often called "the common cold." Follow these instructions at home:  Take medicines only as told by your doctor.  Gargle warm  saltwater or take cough drops to comfort your throat as told by your doctor.  Use a warm mist humidifier or inhale steam from a shower to increase air moisture. This may make it easier to breathe.  Drink enough fluid to keep your pee (urine) clear or pale yellow.  Eat soups and other clear broths.  Have a healthy diet.  Rest as needed.  Go back to work when your fever is gone or your doctor says it is okay. ? You may need to stay home longer to avoid giving your URI to others. ? You can also wear a face mask and wash your hands often to prevent spread of the virus.  Use your inhaler more if you have asthma.  Do not use any tobacco products, including cigarettes, chewing tobacco, or electronic cigarettes. If you need help quitting, ask your doctor. Contact a doctor if:  You are getting worse, not better.  Your symptoms are not helped by medicine.  You have chills.  You are getting more short of breath.  You have brown or red mucus.  You have yellow or brown discharge from your nose.  You have pain in your face, especially when you bend forward.  You have a fever.  You have puffy (swollen) neck glands.  You have pain while swallowing.  You have white areas in the back of your throat. Get help right away if:  You have very bad or constant: ? Headache. ? Ear pain. ? Pain in your forehead, behind your eyes, and over your cheekbones (sinus pain). ? Chest pain.  You have long-lasting (chronic) lung disease and any of the following: ? Wheezing. ? Long-lasting cough. ? Coughing up blood. ? A change in your usual mucus.  You have a stiff neck.  You have changes in  your: ? Vision. ? Hearing. ? Thinking. ? Mood. This information is not intended to replace advice given to you by your health care provider. Make sure you discuss any questions you have with your health care provider. Document Released: 02/26/2008 Document Revised: 05/12/2016 Document Reviewed: 12/15/2013 Elsevier Interactive Patient Education  2018 [employer].   IF you received an x-ray today, you will receive an invoice from [facility] Radiology. Please contact [facility] Radiology at [phone] with questions or concerns regarding your invoice.   IF you received labwork today, you will receive an invoice from [place]. Please contact [facility] at [phone] with questions or concerns regarding your invoice.   Our billing staff will not be able to assist you with questions regarding bills from these companies.  You will be contacted with the lab results as soon as they are available. The fastest way to get your results is to activate your My Chart account. Instructions are located on the last page of this paperwork. If you have not heard from [place] regarding the results in 2 weeks, please contact this office.       I personally performed the services described in this documentation, which was scribed in my presence. The recorded information has been reviewed and considered for accuracy and completeness, addended by me as needed, and agree with information above.  Signed,   [name], MD [facility] [place].  09/27/17 9:16 AM

## 2017-10-29 ENCOUNTER — Telehealth: Payer: Self-pay | Admitting: Family Medicine

## 2017-10-29 NOTE — Telephone Encounter (Signed)
Called and spoke with pt to remind them of their apt tomorrow. Reminded pt of 15 minutes early, building number and 10 minute late policy.

## 2017-10-30 ENCOUNTER — Encounter: Payer: Self-pay | Admitting: Family Medicine

## 2017-10-30 ENCOUNTER — Other Ambulatory Visit: Payer: Self-pay

## 2017-10-30 ENCOUNTER — Ambulatory Visit: Payer: Managed Care, Other (non HMO) | Admitting: Family Medicine

## 2017-10-30 VITALS — BP 126/78 | HR 64 | Resp 16 | Ht 74.8 in | Wt 260.2 lb

## 2017-10-30 DIAGNOSIS — I10 Essential (primary) hypertension: Secondary | ICD-10-CM

## 2017-10-30 DIAGNOSIS — Z23 Encounter for immunization: Secondary | ICD-10-CM | POA: Diagnosis not present

## 2017-10-30 DIAGNOSIS — E7800 Pure hypercholesterolemia, unspecified: Secondary | ICD-10-CM | POA: Diagnosis not present

## 2017-10-30 DIAGNOSIS — R739 Hyperglycemia, unspecified: Secondary | ICD-10-CM | POA: Diagnosis not present

## 2017-10-30 MED ORDER — ZOSTER VAC RECOMB ADJUVANTED 50 MCG/0.5ML IM SUSR: 0.5000 mL | Freq: Once | INTRAMUSCULAR | 1 refills | Status: AC

## 2017-10-30 MED ORDER — HYDROCHLOROTHIAZIDE 12.5 MG PO CAPS: ORAL_CAPSULE | ORAL | 1 refills | Status: DC

## 2017-10-30 NOTE — Patient Instructions (Addendum)
Return at your convenience for fasting lab work. Shingles vaccine was sent to your pharmacy. Follow-up for a physical in the next 6 months. Thanks for coming in today.    IF you received an x-ray today, you will receive an invoice from [facility] Radiology. Please contact [facility] Radiology at [phone] with questions or concerns regarding your invoice.   IF you received labwork today, you will receive an invoice from [place]. Please contact [facility] at [phone] with questions or concerns regarding your invoice.   Our billing staff will not be able to assist you with questions regarding bills from these companies.  You will be contacted with the lab results as soon as they are available. The fastest way to get your results is to activate your My Chart account. Instructions are located on the last page of this paperwork. If you have not heard from [place] regarding the results in 2 weeks, please contact this office.

## 2017-10-30 NOTE — Progress Notes (Signed)
Subjective:  This chart was scribed for Wendie Agreste, MD by Tamsen Roers, at [place] at [facility].  This patient was seen in room 11 and the patient's care was started at 8:45 AM.   Chief Complaint  Patient presents with  . Hypertension    patient presents for 6 month follow up on HTN    Patient ID: Brian Higgins, male    DOB: 01-Jul-1956, 62 y.o.   MRN: [id number]  HPI HPI Comments: Brian Higgins is a 62 y.o. male who presents to Primary Care at [facility] for a follow up.     Hypertension: He is on HCTZ 12.5 mg QD. ---- Patient has been having "good" blood pressure readings and denies any side effects from his medication. Denies any chest pains, SOB, lightheadedness or dizziness.  Lab Results  Component Value Date   CREATININE 0.87 04/28/2017   BP Readings from Last 3 Encounters:  10/30/17 126/78  09/27/17 132/78  04/28/17 139/81    Hyperglycemia:A1C in August barely at level of pre-diabetes. He has started exercising again after knee surgery. Continued diet and exercise discussed. --- He is not fasting today but will come back tomorrow to have his labs completed. Patients father is a diabetic (currently in hospice) and he has a "very fit" brother who is also a diabetic. Denies any blurry vision.  Wt Readings from Last 3 Encounters:  10/30/17 260 lb 3.2 oz (118 kg)  09/27/17 259 lb (117.5 kg)  04/28/17 258 lb (117 kg)    Hyperlipidemia: Also recommended exercise and diet changes. ---- Patient exercises five days a week (gym) and tennis two times per week.  He does not have any complaints with his knees.  He does not eat much sugary foods, fast foods or sugary drinks. He has an occasional glass of wine during the week and an occasional vodka tonic on the weekends.  Lab Results  Component Value Date   CHOL 199 04/28/2017   HDL 48 04/28/2017   LDLCALC 123 (H) 04/28/2017   TRIG 138 04/28/2017   CHOLHDL 4.1 04/28/2017   Lab Results  Component Value Date   ALT 16  04/28/2017   AST 19 04/28/2017   ALKPHOS 54 04/28/2017   BILITOT 0.8 04/28/2017   Patient would like a flu shot and shingles vaccine today.    Patient Active Problem List   Diagnosis Date Noted  . OA (osteoarthritis) of knee 01/13/2017  . Prostate cancer ([place]) 11/21/2012   Past Medical History:  Diagnosis Date  . Allergy   . Arthritis   . GERD (gastroesophageal reflux disease)    mild no meds  . Hypertension   . Prostate cancer [facility])    Past Surgical History:  Procedure Laterality Date  . HAND TENDON SURGERY     Right  arm  . PROSTATECTOMY    . ROTATOR CUFF REPAIR Bilateral left 2007 and right 09/2009  . TOTAL KNEE ARTHROPLASTY Left 01/13/2017   Procedure: LEFT TOTAL KNEE ARTHROPLASTY;  Surgeon: Gaynelle Arabian, MD;  Location: [facility];  Service: Orthopedics;  Laterality: Left;   Allergies  Allergen Reactions  . Other     Catgut suture did not dissolve--causes infection (2007)   Prior to Admission medications   Medication Sig Start Date End Date Taking? Authorizing Provider  cetirizine (ZYRTEC) 10 MG tablet Take 10 mg by mouth daily as needed for allergies (for seasonal allergies (pt. takes in Spring & Fall)).    Yes [provider]  hydrochlorothiazide (MICROZIDE) 12.5 MG  capsule TAKE 1 CAPSULE BY MOUTH EVERY DAY. 04/28/17  Yes Wendie Agreste, MD  acetaminophen (TYLENOL) 500 MG tablet Take 500-1,000 mg by mouth 2 (two) times daily as needed (for pain.).    [provider]  azithromycin (ZITHROMAX) 250 MG tablet Take 2 pills by mouth on day 1, then 1 pill by mouth per day on days 2 through 5. Patient not taking: Reported on 10/30/2017 09/27/17   Wendie Agreste, MD  HYDROcodone-homatropine [facility]) 5-1.5 MG/5ML syrup 81m by mouth a bedtime as needed for cough. Patient not taking: Reported on 10/30/2017 09/27/17   Wendie Agreste, MD  Propylene Glycol (SYSTANE BALANCE) 0.6 % SOLN Place 1 drop into both eyes 2 (two) times daily as needed (for allergies).     [provider]   Social History   Socioeconomic History  . Marital status: Married    Spouse name: Not on file  . Number of children: Not on file  . Years of education: Not on file  . Highest education level: Not on file  Social Needs  . Financial resource strain: Not on file  . Food insecurity - worry: Not on file  . Food insecurity - inability: Not on file  . Transportation needs - medical: Not on file  . Transportation needs - non-medical: Not on file  Occupational History  . Not on file  Tobacco Use  . Smoking status: Never Smoker  . Smokeless tobacco: Never Used  Substance and Sexual Activity  . Alcohol use: Yes    Alcohol/week: 3.6 - 4.2 oz    Types: 4 Standard drinks or equivalent, 2 - 3 Glasses of wine per week    Comment: x a week  . Drug use: No  . Sexual activity: Yes  Other Topics Concern  . Not on file  Social History Narrative  . Not on file   Review of Systems  Constitutional: Negative for chills and fever.  Eyes: Negative for pain and visual disturbance.  Respiratory: Negative for cough, choking and shortness of breath.   Cardiovascular: Negative for chest pain.  Gastrointestinal: Negative for nausea and vomiting.  Musculoskeletal: Negative for neck pain and neck stiffness.  Neurological: Negative for dizziness and light-headedness.      Objective:   Physical Exam  Constitutional: He is oriented to person, place, and time. He appears well-developed and well-nourished.  HENT:  Head: Normocephalic and atraumatic.  Eyes: EOM are normal. Pupils are equal, round, and reactive to light.  Neck: No JVD present. Carotid bruit is not present.  Cardiovascular: Normal rate, regular rhythm and normal heart sounds.  No murmur heard. Pulmonary/Chest: Effort normal and breath sounds normal. He has no rales.  Musculoskeletal: He exhibits no edema.  Neurological: He is alert and oriented to person, place, and time.  Skin: Skin is warm and dry.    Psychiatric: He has a normal mood and affect.  Vitals reviewed.  Vitals:   10/30/17 0833  BP: 126/78  Pulse: 64  Resp: 16  SpO2: 95%  Weight: 260 lb 3.2 oz (118 kg)  Height: 6' 2.8" (1.9 m)       Assessment & Plan:   Brian Higgins is a 62 y.o. male Essential hypertension - Plan: Comprehensive metabolic panel  - Stable. No changes in HCT dosing for now. Plan on fasting lab visit  Hyperglycemia - Plan: Hemoglobin A1c  -Has continued exercise and watching diet. Check A1c  Pure hypercholesterolemia - Plan: Comprehensive metabolic panel, Lipid panel  -Mild LDL  elevation, repeat lipids  Need for shingles vaccine - Plan: Zoster Vaccine Adjuvanted [facility]) injection sent to pharmacy  Flu vaccine need - Plan: Flu Vaccine QUAD 36+ mos IM   Meds ordered this encounter  Medications  . Zoster Vaccine Adjuvanted [facility]) injection    Sig: Inject 0.5 mLs into the muscle once for 1 dose. Repeat in 2-6 months.    Dispense:  0.5 mL    Refill:  1   Patient Instructions   Return at your convenience for fasting lab work. Shingles vaccine was sent to your pharmacy. Follow-up for a physical in the next 6 months. Thanks for coming in today.    IF you received an x-ray today, you will receive an invoice from [facility]. Please contact [facility] at [phone] with questions or concerns regarding your invoice.   IF you received labwork today, you will receive an invoice from [facility]. Please contact [facility] at [phone] with questions or concerns regarding your invoice.   Our billing staff will not be able to assist you with questions regarding bills from these companies.  You will be contacted with the lab results as soon as they are available. The fastest way to get your results is to activate your My Chart account. Instructions are located on the last page of this paperwork. If you have not heard from [place] regarding the results in 2 weeks, please contact this  office.      I personally performed the services described in this documentation, which was scribed in my presence. The recorded information has been reviewed and considered for accuracy and completeness, addended by me as needed, and agree with information above.  Signed,   [name], MD Primary [facility] at [place].  10/30/17 9:12 AM

## 2017-10-31 ENCOUNTER — Ambulatory Visit: Payer: Managed Care, Other (non HMO)

## 2017-10-31 DIAGNOSIS — I10 Essential (primary) hypertension: Secondary | ICD-10-CM

## 2017-10-31 DIAGNOSIS — E7800 Pure hypercholesterolemia, unspecified: Secondary | ICD-10-CM

## 2017-10-31 DIAGNOSIS — R739 Hyperglycemia, unspecified: Secondary | ICD-10-CM

## 2017-11-01 LAB — HEMOGLOBIN A1C
Est. average glucose Bld gHb Est-mCnc: 117 mg/dL
Hgb A1c MFr Bld: 5.7 % — ABNORMAL HIGH (ref 4.8–5.6)

## 2017-11-01 LAB — COMPREHENSIVE METABOLIC PANEL
ALT: 5 IU/L (ref 0–44)
AST: 16 IU/L (ref 0–40)
Albumin/Globulin Ratio: 2.5 — ABNORMAL HIGH (ref 1.2–2.2)
Albumin: 4.7 g/dL (ref 3.6–4.8)
Alkaline Phosphatase: 54 IU/L (ref 39–117)
BUN/Creatinine Ratio: 19 (ref 10–24)
BUN: 19 mg/dL (ref 8–27)
Bilirubin Total: 1 mg/dL (ref 0.0–1.2)
CO2: 20 mmol/L (ref 20–29)
Calcium: 9.3 mg/dL (ref 8.6–10.2)
Chloride: 104 mmol/L (ref 96–106)
Creatinine, Ser: 0.98 mg/dL (ref 0.76–1.27)
GFR calc Af Amer: 96 mL/min/{1.73_m2} (ref >59)
GFR calc non Af Amer: 83 mL/min/{1.73_m2} (ref >59)
Globulin, Total: 1.9 g/dL (ref 1.5–4.5)
Glucose: 104 mg/dL — ABNORMAL HIGH (ref 65–99)
Potassium: 4.3 mmol/L (ref 3.5–5.2)
Sodium: 143 mmol/L (ref 134–144)
Total Protein: 6.6 g/dL (ref 6.0–8.5)

## 2017-11-01 LAB — LIPID PANEL
Chol/HDL Ratio: 4.2 ratio (ref 0.0–5.0)
Cholesterol, Total: 181 mg/dL (ref 100–199)
HDL: 43 mg/dL (ref >39)
LDL Calculated: 115 mg/dL — ABNORMAL HIGH (ref 0–99)
Triglycerides: 114 mg/dL (ref 0–149)
VLDL Cholesterol Cal: 23 mg/dL (ref 5–40)

## 2018-04-06 NOTE — Progress Notes (Signed)
[name] [address] [place], [place] [address] Phone: [phone] Subjective:      CC: right foot pain  [id number]  Brian Higgins is a 62 y.o. male coming in with complaint of right foot and ankle pain. Had his left knee replaced last year. Has orthotics. Bothers him with every step. Plays tennis and goes to the gym. Was told he has plantar fascia. The arch is very stiff and painful in the morning. States he foot doesn't "sit correctly". "Ankle doesn't sit well with the foot". Thinks he has a fallen arch.   Onset- Chronic Location- Arch, ankle Duration-  Character- Sharp pain depending on movement Aggravating factors- walking, running  Reliving factors-  Therapies tried- [employer] out of 10     Past Medical History:  Diagnosis Date  . Allergy   . Arthritis   . GERD (gastroesophageal reflux disease)    mild no meds  . Hypertension   . Prostate cancer [facility])    Past Surgical History:  Procedure Laterality Date  . HAND TENDON SURGERY     Right  arm  . PROSTATECTOMY    . ROTATOR CUFF REPAIR Bilateral left 2007 and right 09/2009  . TOTAL KNEE ARTHROPLASTY Left 01/13/2017   Procedure: LEFT TOTAL KNEE ARTHROPLASTY;  Surgeon: Gaynelle Arabian, MD;  Location: [facility];  Service: Orthopedics;  Laterality: Left;   Social History   Socioeconomic History  . Marital status: Married    Spouse name: Not on file  . Number of children: Not on file  . Years of education: Not on file  . Highest education level: Not on file  Occupational History  . Not on file  Social Needs  . Financial resource strain: Not on file  . Food insecurity:    Worry: Not on file    Inability: Not on file  . Transportation needs:    Medical: Not on file    Non-medical: Not on file  Tobacco Use  . Smoking status: Never Smoker  . Smokeless tobacco: Never Used  Substance and Sexual Activity  . Alcohol use: Yes    Alcohol/week: 3.6 - 4.2 oz    Types: 4 Standard  drinks or equivalent, 2 - 3 Glasses of wine per week    Comment: x a week  . Drug use: No  . Sexual activity: Yes  Lifestyle  . Physical activity:    Days per week: Not on file    Minutes per session: Not on file  . Stress: Not on file  Relationships  . Social connections:    Talks on phone: Not on file    Gets together: Not on file    Attends religious service: Not on file    Active member of club or organization: Not on file    Attends meetings of clubs or organizations: Not on file    Relationship status: Not on file  Other Topics Concern  . Not on file  Social History Narrative  . Not on file   Allergies  Allergen Reactions  . 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 Cancer Mother   . Cancer Father   . Diabetes Father   . Hyperlipidemia Father   . Cancer Sister   . Stroke Maternal Grandmother   . Heart disease Maternal Grandfather      Past medical history, social, surgical and family history all reviewed  in electronic medical record.  No pertanent information unless stated regarding to the chief complaint.   Review of Systems:Review of systems updated and as accurate as of 04/07/18  No headache, visual changes, nausea, vomiting, diarrhea, constipation, dizziness, abdominal pain, skin rash, fevers, chills, night sweats, weight loss, swollen lymph nodes, body aches,  chest pain, shortness of breath, mood changes.  Mild muscle aches and joint swelling  Objective  Blood pressure (!) 150/84, pulse 65, height 6' 2.8" (1.9 m), weight 263 lb (119.3 kg), SpO2 95 %. Systems examined below as of 04/07/18   General: No apparent distress alert and oriented x3 mood and affect normal, dressed appropriately.  HEENT: Pupils equal, extraocular movements intact  Respiratory: Patient's speak in full sentences and does not appear short of breath  Cardiovascular: No lower extremity edema, non tender, no  erythema  Skin: Warm dry intact with no signs of infection or rash on extremities or on axial skeleton.  Abdomen: Soft nontender  Neuro: Cranial nerves II through XII are intact, neurovascularly intact in all extremities with 2+ DTRs and 2+ pulses.  Lymph: No lymphadenopathy of posterior or anterior cervical chain or axillae bilaterally.  Gait antalgic MSK:  Non tender with full range of motion and good stability and symmetric strength and tone of shoulders, elbows, wrist, hip, knee bilaterally.  Left knee replacement noted.  Leg length discrepancy noted with the right leg now activities shorter Ankle: Right No visible erythema or swelling. Mild limited range of motion in all planes lacking last 5 degrees of extension Strength is 4/5 in all directions but symmetric. Stable lateral and medial ligaments; squeeze test and kleiger test unremarkable; Talar dome nontender; No pain at base of 5th MT; No tenderness over cuboid; No tenderness over N spot or navicular prominence No tenderness on posterior aspects of lateral and medial malleolus No sign of peroneal tendon subluxations or tenderness to palpation Negative tarsal tunnel tinel's tender over the posterior tibialis tendon noted. Able to walk 4 steps. Contralateral ankle unremarkable  MSK US performed of: Right ankle This study was ordered, performed, and interpreted by Charlann Boxer D.O.  Foot/Ankle:   All structures visualized.   Talar dome unremarkable  Ankle mortise without effusion. Peroneus longus and brevis tendons unremarkable on long and transverse views without sheath effusions. Posterior tibialis.  Hypoechoic changes noted with some increasing Doppler flow but no intrasubstance tearing noted. flexor hallucis longus, and flexor digitorum longus tendons unremarkable on long and transverse views without sheath effusions. Achilles tendon visualized along length of tendon and unremarkable on long and transverse views without sheath  effusion. Anterior Talofibular Ligament and Calcaneofibular Ligaments unremarkable and intact. Deltoid Ligament unremarkable and intact. Plantar fascia intact and without effusion, normal thickness. No increased doppler signal, cap sign, or thickening of tibial cortex. Power doppler signal normal.  IMPRESSION: Posterior tibialis tendinitis  [id number]; 15 additional minutes spent for Therapeutic exercises as stated in above notes.  This included exercises focusing on stretching, strengthening, with significant focus on eccentric aspects.   Long term goals include an improvement in range of motion, strength, endurance as well as avoiding reinjury. Patient's frequency would include in 1-2 times a day, 3-5 times a week for a duration of 6-12 weeks. Ankle strengthening that included:  Basic range of motion exercises to allow proper full motion at ankle Stretching of the lower leg and hamstrings  Theraband exercises for the lower leg - inversion, eversion, dorsiflexion and plantarflexion each to be completed with a theraband Balance exercises to  increase proprioception Weight bearing exercises to increase strength and balance  Proper technique shown and discussed handout in great detail with ATC.  All questions were discussed and answered.       Impression and Recommendations:     This case required medical decision making of moderate complexity.      Note: This dictation was prepared with Dragon dictation along with smaller phrase technology. Any transcriptional errors that result from this process are unintentional.

## 2018-04-07 ENCOUNTER — Encounter: Payer: Self-pay | Admitting: Family Medicine

## 2018-04-07 ENCOUNTER — Ambulatory Visit: Payer: Managed Care, Other (non HMO) | Admitting: Family Medicine

## 2018-04-07 ENCOUNTER — Ambulatory Visit: Payer: Self-pay

## 2018-04-07 VITALS — BP 150/84 | HR 65 | Ht 74.8 in | Wt 263.0 lb

## 2018-04-07 DIAGNOSIS — M79671 Pain in right foot: Secondary | ICD-10-CM

## 2018-04-07 DIAGNOSIS — M76821 Posterior tibial tendinitis, right leg: Secondary | ICD-10-CM

## 2018-04-07 DIAGNOSIS — M2170 Unequal limb length (acquired), unspecified site: Secondary | ICD-10-CM | POA: Diagnosis not present

## 2018-04-07 NOTE — Assessment & Plan Note (Signed)
Discussed heel lift.

## 2018-04-07 NOTE — Assessment & Plan Note (Signed)
Leg length discrepancy noted, falling in the arch, we discussed over-the-counter orthotics, proper shoes, home exercises, topical anti-inflammatories worsening symptoms consider injection and formal physical therapy.  Follow-up again in 4 weeks

## 2018-04-07 NOTE — Patient Instructions (Addendum)
Good to see you  Posterior tibialis tendonitis and new leg length on the other side.  Ice 20 minutes 2 times daily. Usually after activity and before bed. pennsaid pinkie amount topically 2 times daily as needed.  Heel lift 1/16- 1/18 of inch would be good Body helix ankle sleeve size large with working out.  Try to avoid being barefoot in the house.  See me again in 4 weeks if not perfect and we will consider injection

## 2018-04-28 ENCOUNTER — Encounter: Payer: Managed Care, Other (non HMO) | Admitting: Family Medicine

## 2018-05-07 NOTE — Progress Notes (Deleted)
[facility] [address] [place], [place] [address] Phone: [phone] Subjective:    I'm seeing this patient by the request  of:    CC:   [id number]  Brian Higgins is a 62 y.o. male coming in with complaint of ***  Onset-  Location Duration-  Character- Aggravating factors- Reliving factors-  Therapies tried-  Severity-     Past Medical History:  Diagnosis Date  . Allergy   . Arthritis   . GERD (gastroesophageal reflux disease)    mild no meds  . Hypertension   . Prostate cancer [facility])    Past Surgical History:  Procedure Laterality Date  . HAND TENDON SURGERY     Right  arm  . PROSTATECTOMY    . ROTATOR CUFF REPAIR Bilateral left 2007 and right 09/2009  . TOTAL KNEE ARTHROPLASTY Left 01/13/2017   Procedure: LEFT TOTAL KNEE ARTHROPLASTY;  Surgeon: Gaynelle Arabian, MD;  Location: [facility];  Service: Orthopedics;  Laterality: Left;   Social History   Socioeconomic History  . Marital status: Married    Spouse name: Not on file  . Number of children: Not on file  . Years of education: Not on file  . Highest education level: Not on file  Occupational History  . Not on file  Social Needs  . Financial resource strain: Not on file  . Food insecurity:    Worry: Not on file    Inability: Not on file  . Transportation needs:    Medical: Not on file    Non-medical: Not on file  Tobacco Use  . Smoking status: Never Smoker  . Smokeless tobacco: Never Used  Substance and Sexual Activity  . Alcohol use: Yes    Alcohol/week: 6.0 - 7.0 standard drinks    Types: 4 Standard drinks or equivalent, 2 - 3 Glasses of wine per week    Comment: x a week  . Drug use: No  . Sexual activity: Yes  Lifestyle  . Physical activity:    Days per week: Not on file    Minutes per session: Not on file  . Stress: Not on file  Relationships  . Social connections:    Talks on phone: Not on file    Gets together: Not on file    Attends religious  service: Not on file    Active member of club or organization: Not on file    Attends meetings of clubs or organizations: Not on file    Relationship status: Not on file  Other Topics Concern  . Not on file  Social History Narrative  . Not on file   Allergies  Allergen Reactions  . 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 Cancer Mother   . Cancer Father   . Diabetes Father   . Hyperlipidemia Father   . Cancer Sister   . Stroke Maternal Grandmother   . Heart disease Maternal Grandfather      Past medical history, social, surgical and family history all reviewed in electronic medical record.  No pertanent information unless stated regarding to the chief complaint.   Review of Systems:Review of systems updated and as accurate as of 05/07/18  No headache, visual changes, nausea, vomiting, diarrhea, constipation, dizziness, abdominal pain, skin rash, fevers, chills, night sweats, weight loss, swollen lymph nodes, body aches, joint swelling, muscle aches, chest pain, shortness of breath, mood changes.  Objective  There were no vitals taken for this visit. Systems examined below as of 05/07/18   General: No apparent distress alert and oriented x3 mood and affect normal, dressed appropriately.  HEENT: Pupils equal, extraocular movements intact  Respiratory: Patient's speak in full sentences and does not appear short of breath  Cardiovascular: No lower extremity edema, non tender, no erythema  Skin: Warm dry intact with no signs of infection or rash on extremities or on axial skeleton.  Abdomen: Soft nontender  Neuro: Cranial nerves II through XII are intact, neurovascularly intact in all extremities with 2+ DTRs and 2+ pulses.  Lymph: No lymphadenopathy of posterior or anterior cervical chain or axillae bilaterally.  Gait normal with good balance and coordination.  MSK:  Non tender with full range of  motion and good stability and symmetric strength and tone of shoulders, elbows, wrist, hip, knee and ankles bilaterally.     Impression and Recommendations:     This case required medical decision making of moderate complexity.      Note: This dictation was prepared with Dragon dictation along with smaller phrase technology. Any transcriptional errors that result from this process are unintentional.

## 2018-05-11 ENCOUNTER — Ambulatory Visit: Payer: Managed Care, Other (non HMO) | Admitting: Family Medicine

## 2018-05-22 ENCOUNTER — Other Ambulatory Visit: Payer: Self-pay

## 2018-05-22 ENCOUNTER — Other Ambulatory Visit: Payer: Self-pay | Admitting: Family Medicine

## 2018-05-22 ENCOUNTER — Encounter: Payer: Self-pay | Admitting: Family Medicine

## 2018-05-22 ENCOUNTER — Ambulatory Visit (INDEPENDENT_AMBULATORY_CARE_PROVIDER_SITE_OTHER): Payer: Managed Care, Other (non HMO) | Admitting: Family Medicine

## 2018-05-22 VITALS — BP 134/80 | HR 54 | Temp 97.7°F | Resp 16 | Ht 74.21 in | Wt 264.0 lb

## 2018-05-22 DIAGNOSIS — Z23 Encounter for immunization: Secondary | ICD-10-CM

## 2018-05-22 DIAGNOSIS — I10 Essential (primary) hypertension: Secondary | ICD-10-CM

## 2018-05-22 DIAGNOSIS — Z0000 Encounter for general adult medical examination without abnormal findings: Secondary | ICD-10-CM

## 2018-05-22 DIAGNOSIS — Z125 Encounter for screening for malignant neoplasm of prostate: Secondary | ICD-10-CM

## 2018-05-22 DIAGNOSIS — R7303 Prediabetes: Secondary | ICD-10-CM

## 2018-05-22 DIAGNOSIS — Z8546 Personal history of malignant neoplasm of prostate: Secondary | ICD-10-CM

## 2018-05-22 DIAGNOSIS — E785 Hyperlipidemia, unspecified: Secondary | ICD-10-CM

## 2018-05-22 MED ORDER — ZOSTER VAC RECOMB ADJUVANTED 50 MCG/0.5ML IM SUSR: 0.5000 mL | Freq: Once | INTRAMUSCULAR | 1 refills | Status: AC

## 2018-05-22 MED ORDER — HYDROCHLOROTHIAZIDE 12.5 MG PO CAPS: ORAL_CAPSULE | ORAL | 3 refills | Status: DC

## 2018-05-22 NOTE — Progress Notes (Signed)
Subjective:  By signing my name below, I, Essence Howell, attest that this documentation has been prepared under the direction and in the presence of Wendie Agreste, MD Electronically Signed: Ladene Artist, ED Scribe 05/22/2018 at 11:06 AM.   Patient ID: Brian Higgins, male    DOB: 06/06/1956, 62 y.o.   MRN: [id number]  Chief Complaint  Patient presents with  . Annual Exam   HPI Brian Higgins is a 63 y.o. male who presents to Primary Care at [facility] for an annual exam. Recently treated by Dr. Hulan Saas for posterior tibialis tendinitis. H/o HTN.  HTN Lab Results  Component Value Date   CREATININE 0.98 10/31/2017   BP Readings from Last 3 Encounters:  05/22/18 134/80  04/07/18 (!) 150/84  10/30/17 126/78  HCTZ 12.5 mg qd.  Pre-DM Lab Results  Component Value Date   HGBA1C 5.7 (H) 10/31/2017   Wt Readings from Last 3 Encounters:  05/22/18 264 lb (119.7 kg)  04/07/18 263 lb (119.3 kg)  10/30/17 260 lb 3.2 oz (118 kg)  Continue to exercise as recommended. - Denies abdominal pain.  Hyperlipidemia Lab Results  Component Value Date   CHOL 181 10/31/2017   HDL 43 10/31/2017   LDLCALC 115 (H) 10/31/2017   TRIG 114 10/31/2017   CHOLHDL 4.2 10/31/2017   Lab Results  Component Value Date   ALT 5 10/31/2017   AST 16 10/31/2017   ALKPHOS 54 10/31/2017   BILITOT 1.0 10/31/2017  Statin was recommended but option of exercise/diet initially with rpt testing.  CA Screening Colonoscopy: 06/2015; rpt in 10 yrs Prostate CA Screening: PSA undetectable 07/17/15. H/o prostate CA with h/o prostatectomy, urologist Dr. Alinda Money. - Pt has not seen urology lately; states he is past the 10 yr mark now. Lab Results  Component Value Date   PSA <0.01 07/17/2015   Immunizations Immunization History  Administered Date(s) Administered  . Influenza,inj,Quad PF,6+ Mos 05/22/2015, 10/30/2017  . Tdap 09/24/2011  Shingrix: sent to pharmacy Influenza: today  Depression  Screening Depression screen [facility] 2/9 05/22/2018 05/22/2018 10/30/2017 09/27/2017 04/28/2017  Decreased Interest 0 0 0 0 0  Down, Depressed, Hopeless 0 0 0 0 0  PHQ - 2 Score 0 0 0 0 0   Sinus Symptoms Pt also reports intermittent, gradually improving sinus pressure, nasal congestion, yellow discolored nasal drainage x 2 wks. Reports symptoms started when he was on a trip in the mountains. Pt has tried Zyrtec and has currently been on Amoxicillin for 8 days for tooth extraction (2 wk course). Reports his entire family has been ill with the same.   Visual Acuity Screening   Right eye Left eye Both eyes  Without correction:     With correction: 20/25 20/25 20/20    Vision: appointment next month; pt had cataracts replacement last yr Dentist: last seen by dentist last wk, tooth extracted - on Amoxicillin x 8 days Exercise: plays tennis and exercises at the gym 4-5 days/wk. 30 mins cardio, 30 mins light weights  Patient Active Problem List   Diagnosis Date Noted  . Tibialis posterior tendinitis, right 04/07/2018  . Leg length discrepancy 04/07/2018  . OA (osteoarthritis) of knee 01/13/2017  . Prostate cancer ([place]) 11/21/2012   Past Medical History:  Diagnosis Date  . Allergy   . Arthritis   . GERD (gastroesophageal reflux disease)    mild no meds  . Hypertension   . Prostate cancer [facility])    Past Surgical History:  Procedure Laterality Date  . HAND TENDON  SURGERY     Right  arm  . PROSTATECTOMY    . ROTATOR CUFF REPAIR Bilateral left 2007 and right 09/2009  . TOTAL KNEE ARTHROPLASTY Left 01/13/2017   Procedure: LEFT TOTAL KNEE ARTHROPLASTY;  Surgeon: Gaynelle Arabian, MD;  Location: [facility];  Service: Orthopedics;  Laterality: Left;   Allergies  Allergen Reactions  . Other     Catgut suture did not dissolve--causes infection (2007)   Prior to Admission medications   Medication Sig Start Date End Date Taking? Authorizing Provider  acetaminophen (TYLENOL) 500 MG tablet Take 500-1,000 mg by  mouth 2 (two) times daily as needed (for pain.).    [provider]  azithromycin (ZITHROMAX) 250 MG tablet Take 2 pills by mouth on day 1, then 1 pill by mouth per day on days 2 through 5. Patient not taking: Reported on 10/30/2017 09/27/17   Wendie Agreste, MD  cetirizine (ZYRTEC) 10 MG tablet Take 10 mg by mouth daily as needed for allergies (for seasonal allergies (pt. takes in Spring & Fall)).     [provider]  hydrochlorothiazide (MICROZIDE) 12.5 MG capsule TAKE 1 CAPSULE BY MOUTH EVERY DAY. 10/30/17   Wendie Agreste, MD  Propylene Glycol (SYSTANE BALANCE) 0.6 % SOLN Place 1 drop into both eyes 2 (two) times daily as needed (for allergies).    [provider]   Social History   Socioeconomic History  . Marital status: Married    Spouse name: Not on file  . Number of children: Not on file  . Years of education: Not on file  . Highest education level: Not on file  Occupational History  . Not on file  Social Needs  . Financial resource strain: Not on file  . Food insecurity:    Worry: Not on file    Inability: Not on file  . Transportation needs:    Medical: Not on file    Non-medical: Not on file  Tobacco Use  . Smoking status: Never Smoker  . Smokeless tobacco: Never Used  Substance and Sexual Activity  . Alcohol use: Yes    Alcohol/week: 6.0 - 7.0 standard drinks    Types: 4 Standard drinks or equivalent, 2 - 3 Glasses of wine per week    Comment: x a week  . Drug use: No  . Sexual activity: Yes  Lifestyle  . Physical activity:    Days per week: Not on file    Minutes per session: Not on file  . Stress: Not on file  Relationships  . Social connections:    Talks on phone: Not on file    Gets together: Not on file    Attends religious service: Not on file    Active member of club or organization: Not on file    Attends meetings of clubs or organizations: Not on file    Relationship status: Not on file  . Intimate partner violence:     Fear of current or ex partner: Not on file    Emotionally abused: Not on file    Physically abused: Not on file    Forced sexual activity: Not on file  Other Topics Concern  . Not on file  Social History Narrative  . Not on file   Review of Systems  HENT: Positive for congestion, rhinorrhea and sinus pressure.   Gastrointestinal: Negative for abdominal pain.  All other systems reviewed and are negative. 13 point ROS neg except for above    Objective:  Physical Exam  Constitutional: He is oriented to person, place, and time. He appears well-developed and well-nourished.  HENT:  Head: Normocephalic and atraumatic.  Right Ear: External ear normal.  Left Ear: External ear normal.  Nose: Right sinus exhibits no maxillary sinus tenderness and no frontal sinus tenderness. Left sinus exhibits no maxillary sinus tenderness and no frontal sinus tenderness.  Mouth/Throat: Oropharynx is clear and moist.  Eyes: Pupils are equal, round, and reactive to light. Conjunctivae and EOM are normal.  Neck: Normal range of motion. Neck supple. No thyromegaly present.  Cardiovascular: Normal rate, regular rhythm, normal heart sounds and intact distal pulses.  Pulmonary/Chest: Effort normal and breath sounds normal. No respiratory distress. He has no wheezes.  Abdominal: Soft. He exhibits no distension. There is no tenderness.  Musculoskeletal: Normal range of motion. He exhibits no edema or tenderness.  Lymphadenopathy:    He has no cervical adenopathy.  Neurological: He is alert and oriented to person, place, and time. He has normal reflexes.  Skin: Skin is warm and dry.  Psychiatric: He has a normal mood and affect. His behavior is normal.  Vitals reviewed.  Vitals:   05/22/18 1043  BP: 134/80  Pulse: (!) 54  Resp: 16  Temp: 97.7 F (36.5 C)  TempSrc: Oral  SpO2: 95%  Weight: 264 lb (119.7 kg)  Height: 6' 2.21" (1.885 m)     Assessment & Plan:   Crist Kruszka is a 62 y.o. male Annual  physical exam  - -anticipatory guidance as below in AVS, screening labs above. Health maintenance items as above in HPI discussed/recommended as applicable.   Need for prophylactic vaccination and inoculation against influenza - Plan: Flu Vaccine QUAD 36+ mos IM  Essential hypertension - Plan: hydrochlorothiazide (MICROZIDE) 12.5 MG capsule, Comprehensive metabolic panel  -  Stable, tolerating current regimen. Medications refilled. Labs pending as above.  Hyperlipidemia, unspecified hyperlipidemia type - Plan: Lipid panel  check lipids after prior travel of diet/exercise. ASCVD risk scoring to determine statin need.   Prediabetes - Plan: Hemoglobin A1c  - continue to work on diet/exercise, check A1c  History of prostate cancer - Plan: PSA Screening for prostate cancer - Plan: PSA  - monitor for change in PSA, prior prostatectomy.   Need for shingles vaccine - Plan: Zoster Vaccine Adjuvanted [facility]) injection sent to pharmacy.   RTC precautions discussed for sinus sx's, but on antibiotics at present. Saline nasal spray, other symptomatic care.    Meds ordered this encounter  Medications  . hydrochlorothiazide (MICROZIDE) 12.5 MG capsule    Sig: TAKE 1 CAPSULE BY MOUTH EVERY DAY.    Dispense:  90 capsule    Refill:  3  . Zoster Vaccine Adjuvanted [facility]) injection    Sig: Inject 0.5 mLs into the muscle once for 1 dose. Repeat in 2-6 months.    Dispense:  0.5 mL    Refill:  1   Patient Instructions    No change in meds today.   Will check labs to decide if other meds recommended, but keep up the good work with exercise.   If sinus symptoms are not improving with the amoxicillin nin next week - return to discuss alternative treatments. Saline nasal spray as needed.   Return to the clinic or go to the nearest emergency room if any of your symptoms worsen or new symptoms occur. Keeping you healthy  Get these tests  Blood pressure- Have your blood pressure checked once  a year by your healthcare provider.  Normal blood pressure is 120/80  Weight- Have your body mass index (BMI) calculated to screen for obesity.  BMI is a measure of body fat based on height and weight. You can also calculate your own BMI at [email or web address].  Cholesterol- Have your cholesterol checked every year.  Diabetes- Have your blood sugar checked regularly if you have high blood pressure, high cholesterol, have a family history of diabetes or if you are overweight.  Screening for Colon Cancer- Colonoscopy starting at age 42.  Screening may begin sooner depending on your family history and other health conditions. Follow up colonoscopy as directed by your Gastroenterologist.  Screening for Prostate Cancer- Both blood work (PSA) and a rectal exam help screen for Prostate Cancer.  Screening begins at age 26 with African-American men and at age 37 with Caucasian men.  Screening may begin sooner depending on your family history.  Take these medicines  Aspirin- One aspirin daily can help prevent Heart disease and Stroke.  Flu shot- Every fall.  Tetanus- Every 10 years.  Zostavax- Once after the age of 43 to prevent Shingles.  Pneumonia shot- Once after the age of 79; if you are younger than 45, ask your healthcare provider if you need a Pneumonia shot.  Take these steps  Don't smoke- If you do smoke, talk to your doctor about quitting.  For tips on how to quit, go to www.smokefree.gov or call 1-800-QUIT-NOW.  Be physically active- Exercise 5 days a week for at least 30 minutes.  If you are not already physically active start slow and gradually work up to 30 minutes of moderate physical activity.  Examples of moderate activity include walking briskly, mowing the yard, dancing, swimming, bicycling, etc.  Eat a healthy diet- Eat a variety of healthy food such as fruits, vegetables, low fat milk, low fat cheese, yogurt, lean meant, poultry, fish, beans, tofu, etc. For more  information go to www.thenutritionsource.org  Drink alcohol in moderation- Limit alcohol intake to less than two drinks a day. Never drink and drive.  Dentist- Brush and floss twice daily; visit your dentist twice a year.  Depression- Your emotional health is as important as your physical health. If you're feeling down, or losing interest in things you would normally enjoy please talk to your healthcare provider.  Eye exam- Visit your eye doctor every year.  Safe sex- If you may be exposed to a sexually transmitted infection, use a condom.  Seat belts- Seat belts can save your life; always wear one.  Smoke/Carbon Monoxide detectors- These detectors need to be installed on the appropriate level of your home.  Replace batteries at least once a year.  Skin cancer- When out in the sun, cover up and use sunscreen 15 SPF or higher.  Violence- If anyone is threatening you, please tell your healthcare provider.  Living Will/ Health care power of attorney- Speak with your healthcare provider and family.   If you have lab work done today you will be contacted with your lab results within the next 2 weeks.  If you have not heard from [place] then please contact us. The fastest way to get your results is to register for My Chart.   IF you received an x-ray today, you will receive an invoice from [facility]. Please contact [facility] at [phone] with questions or concerns regarding your invoice.   IF you received labwork today, you will receive an invoice from [place]. Please contact [facility] at [phone] with questions or concerns regarding your invoice.  Our billing staff will not be able to assist you with questions regarding bills from these companies.  You will be contacted with the lab results as soon as they are available. The fastest way to get your results is to activate your My Chart account. Instructions are located on the last page of this paperwork. If you have  not heard from [place] regarding the results in 2 weeks, please contact this office.       I personally performed the services described in this documentation, which was scribed in my presence. The recorded information has been reviewed and considered for accuracy and completeness, addended by me as needed, and agree with information above.  Signed,   [name], MD Primary [facility] at [place].  05/24/18 9:04 PM

## 2018-05-22 NOTE — Telephone Encounter (Signed)
HCTZ refill Last refill2/7/19 #90 1 refill PCPGreene LOVScheduled for CPE today, 05/22/18 Pharmacy  Med not refilled. Pt scheduled for CPE today

## 2018-05-22 NOTE — Patient Instructions (Addendum)
No change in meds today.   Will check labs to decide if other meds recommended, but keep up the good work with exercise.   If sinus symptoms are not improving with the amoxicillin nin next week - return to discuss alternative treatments. Saline nasal spray as needed.   Return to the clinic or go to the nearest emergency room if any of your symptoms worsen or new symptoms occur. Keeping you healthy  Get these tests  Blood pressure- Have your blood pressure checked once a year by your healthcare provider.  Normal blood pressure is 120/80  Weight- Have your body mass index (BMI) calculated to screen for obesity.  BMI is a measure of body fat based on height and weight. You can also calculate your own BMI at [email or web address].  Cholesterol- Have your cholesterol checked every year.  Diabetes- Have your blood sugar checked regularly if you have high blood pressure, high cholesterol, have a family history of diabetes or if you are overweight.  Screening for Colon Cancer- Colonoscopy starting at age 28.  Screening may begin sooner depending on your family history and other health conditions. Follow up colonoscopy as directed by your Gastroenterologist.  Screening for Prostate Cancer- Both blood work (PSA) and a rectal exam help screen for Prostate Cancer.  Screening begins at age 52 with African-American men and at age 62 with Caucasian men.  Screening may begin sooner depending on your family history.  Take these medicines  Aspirin- One aspirin daily can help prevent Heart disease and Stroke.  Flu shot- Every fall.  Tetanus- Every 10 years.  Zostavax- Once after the age of 88 to prevent Shingles.  Pneumonia shot- Once after the age of 38; if you are younger than 55, ask your healthcare provider if you need a Pneumonia shot.  Take these steps  Don't smoke- If you do smoke, talk to your doctor about quitting.  For tips on how to quit, go to www.smokefree.gov or call  1-800-QUIT-NOW.  Be physically active- Exercise 5 days a week for at least 30 minutes.  If you are not already physically active start slow and gradually work up to 30 minutes of moderate physical activity.  Examples of moderate activity include walking briskly, mowing the yard, dancing, swimming, bicycling, etc.  Eat a healthy diet- Eat a variety of healthy food such as fruits, vegetables, low fat milk, low fat cheese, yogurt, lean meant, poultry, fish, beans, tofu, etc. For more information go to www.thenutritionsource.org  Drink alcohol in moderation- Limit alcohol intake to less than two drinks a day. Never drink and drive.  Dentist- Brush and floss twice daily; visit your dentist twice a year.  Depression- Your emotional health is as important as your physical health. If you're feeling down, or losing interest in things you would normally enjoy please talk to your healthcare provider.  Eye exam- Visit your eye doctor every year.  Safe sex- If you may be exposed to a sexually transmitted infection, use a condom.  Seat belts- Seat belts can save your life; always wear one.  Smoke/Carbon Monoxide detectors- These detectors need to be installed on the appropriate level of your home.  Replace batteries at least once a year.  Skin cancer- When out in the sun, cover up and use sunscreen 15 SPF or higher.  Violence- If anyone is threatening you, please tell your healthcare provider.  Living Will/ Health care power of attorney- Speak with your healthcare provider and family.   If you have lab work  done today you will be contacted with your lab results within the next 2 weeks.  If you have not heard from [place] then please contact us. The fastest way to get your results is to register for My Chart.   IF you received an x-ray today, you will receive an invoice from [facility]. Please contact [facility] at [phone] with questions or concerns regarding your invoice.   IF  you received labwork today, you will receive an invoice from [place]. Please contact [facility] at [phone] with questions or concerns regarding your invoice.   Our billing staff will not be able to assist you with questions regarding bills from these companies.  You will be contacted with the lab results as soon as they are available. The fastest way to get your results is to activate your My Chart account. Instructions are located on the last page of this paperwork. If you have not heard from [place] regarding the results in 2 weeks, please contact this office.

## 2018-05-23 LAB — COMPREHENSIVE METABOLIC PANEL
ALT: 22 IU/L (ref 0–44)
AST: 18 IU/L (ref 0–40)
Albumin/Globulin Ratio: 2.2 (ref 1.2–2.2)
Albumin: 4.6 g/dL (ref 3.6–4.8)
Alkaline Phosphatase: 56 IU/L (ref 39–117)
BUN/Creatinine Ratio: 19 (ref 10–24)
BUN: 17 mg/dL (ref 8–27)
Bilirubin Total: 0.7 mg/dL (ref 0.0–1.2)
CO2: 21 mmol/L (ref 20–29)
Calcium: 9.5 mg/dL (ref 8.6–10.2)
Chloride: 105 mmol/L (ref 96–106)
Creatinine, Ser: 0.89 mg/dL (ref 0.76–1.27)
GFR calc Af Amer: 107 mL/min/{1.73_m2} (ref >59)
GFR calc non Af Amer: 92 mL/min/{1.73_m2} (ref >59)
Globulin, Total: 2.1 g/dL (ref 1.5–4.5)
Glucose: 93 mg/dL (ref 65–99)
Potassium: 4.4 mmol/L (ref 3.5–5.2)
Sodium: 142 mmol/L (ref 134–144)
Total Protein: 6.7 g/dL (ref 6.0–8.5)

## 2018-05-23 LAB — LIPID PANEL
Chol/HDL Ratio: 4.9 ratio (ref 0.0–5.0)
Cholesterol, Total: 192 mg/dL (ref 100–199)
HDL: 39 mg/dL — ABNORMAL LOW (ref >39)
LDL Calculated: 123 mg/dL — ABNORMAL HIGH (ref 0–99)
Triglycerides: 148 mg/dL (ref 0–149)
VLDL Cholesterol Cal: 30 mg/dL (ref 5–40)

## 2018-05-23 LAB — HEMOGLOBIN A1C
Est. average glucose Bld gHb Est-mCnc: 117 mg/dL
Hgb A1c MFr Bld: 5.7 % — ABNORMAL HIGH (ref 4.8–5.6)

## 2018-05-23 LAB — PSA: Prostate Specific Ag, Serum: <0.1 ng/mL (ref 0.0–4.0)

## 2018-09-03 ENCOUNTER — Telehealth: Payer: Self-pay | Admitting: Family Medicine

## 2018-09-03 NOTE — Telephone Encounter (Signed)
Order sent last August and February of this year to [facility] [address].

## 2018-09-03 NOTE — Telephone Encounter (Signed)
Dr. Carlota Raspberry, Pt is stating that he never received a RX for the shingles shot from the last time he was here. Do you still recommend this for him? If so, can we [facility] to [facility] on [address], [place]? Please advise. Thank you!      Called and spoke with pt regarding their appt with Dr. Carlota Raspberry on 11/23/18. Due to providers schedule change, I was able to reschedule pt to 12/01/18 at 8:20 AM. I advised of time, building and late policy. Pt acknowledged.

## 2018-11-23 ENCOUNTER — Ambulatory Visit: Payer: Managed Care, Other (non HMO) | Admitting: Family Medicine

## 2018-12-01 ENCOUNTER — Ambulatory Visit (INDEPENDENT_AMBULATORY_CARE_PROVIDER_SITE_OTHER): Payer: Managed Care, Other (non HMO)

## 2018-12-01 ENCOUNTER — Encounter: Payer: Self-pay | Admitting: Family Medicine

## 2018-12-01 ENCOUNTER — Ambulatory Visit: Payer: Managed Care, Other (non HMO) | Admitting: Family Medicine

## 2018-12-01 ENCOUNTER — Other Ambulatory Visit: Payer: Self-pay

## 2018-12-01 VITALS — BP 140/86 | HR 57 | Temp 97.5°F | Resp 14 | Ht 74.0 in | Wt 258.8 lb

## 2018-12-01 DIAGNOSIS — E785 Hyperlipidemia, unspecified: Secondary | ICD-10-CM | POA: Diagnosis not present

## 2018-12-01 DIAGNOSIS — R7303 Prediabetes: Secondary | ICD-10-CM

## 2018-12-01 DIAGNOSIS — M25571 Pain in right ankle and joints of right foot: Secondary | ICD-10-CM | POA: Diagnosis not present

## 2018-12-01 DIAGNOSIS — S93401A Sprain of unspecified ligament of right ankle, initial encounter: Secondary | ICD-10-CM

## 2018-12-01 DIAGNOSIS — I10 Essential (primary) hypertension: Secondary | ICD-10-CM

## 2018-12-01 MED ORDER — HYDROCHLOROTHIAZIDE 12.5 MG PO CAPS: ORAL_CAPSULE | ORAL | 3 refills | Status: DC

## 2018-12-01 NOTE — Patient Instructions (Addendum)
Blood pressure borderline elevated, but no changes for now.  Will check cholesterol test and prediabetes test again.  Expect those numbers to be better.  If still at prediabetes range, we have the option of low-dose metformin.  X-ray overall reassuring.  I suspect you have a lateral ankle sprain.  See information below.  Ankle brace as needed for stability, but come out of the brace a few times per day with range of motion.  Follow-up in the next 2 to 3 weeks if not improving.  Sooner if worse.  Occasional ibuprofen only if needed.   Ankle Sprain  An ankle sprain is a stretch or tear in a ligament in the ankle. Ligaments are tissues that connect bones to each other. The two most common types of ankle sprains are:  Inversion sprain. This happens when the foot turns inward and the ankle rolls outward. It affects the ligament on the outside of the foot (lateral ligament).  Eversion sprain. This happens when the foot turns outward and the ankle rolls inward. It affects the ligament on the inner side of the foot (medial ligament). What are the causes? This condition is often caused by accidentally rolling or twisting the ankle. What increases the risk? You are more likely to develop this condition if you play sports. What are the signs or symptoms? Symptoms of this condition include:  Pain in your ankle.  Swelling.  Bruising. This may develop right after you sprain your ankle or 1-2 days later.  Trouble standing or walking, especially when you turn or change directions. How is this diagnosed? This condition is diagnosed with:  A physical exam. During the exam, your health care provider will press on certain parts of your foot and ankle and try to move them in certain ways.  X-ray imaging. These may be taken to see how severe the sprain is and to check for broken bones. How is this treated? This condition may be treated with:  A brace or splint. This is used to keep the ankle from  moving until it heals.  An elastic bandage. This is used to support the ankle.  Crutches.  Pain medicine.  Surgery. This may be needed if the sprain is severe.  Physical therapy. This may help to improve the range of motion in the ankle. Follow these instructions at home: If you have a brace or a splint:  Wear the brace or splint as told by your health care provider. Remove it only as told by your health care provider.  Loosen the brace or splint if your toes tingle, become numb, or turn cold and blue.  Keep the brace or splint clean.  If the brace or splint is not waterproof: ? Do not let it get wet. ? Cover it with a watertight covering when you take a bath or a shower. If you have an elastic bandage (dressing):  Remove it to shower or bathe.  Try not to move your ankle much, but wiggle your toes from time to time. This helps to prevent swelling.  Adjust the dressing to make it more comfortable if it feels too tight.  Loosen the dressing if you have numbness or tingling in your foot, or if your foot becomes cold and blue. Managing pain, stiffness, and swelling   Take over-the-counter and prescription medicines only as told by your health care provider.  For 2-3 days, keep your ankle raised (elevated) above the level of your heart as much as possible.  If directed, put  ice on the injured area: ? If you have a removable brace or splint, remove it as told by your health care provider. ? Put ice in a plastic bag. ? Place a towel between your skin and the bag. ? Leave the ice on for 20 minutes, 2-3 times a day. General instructions  Rest your ankle.  Do not use the injured limb to support your body weight until your health care provider says that you can. Use crutches as told by your health care provider.  Do not use any products that contain nicotine or tobacco, such as cigarettes, e-cigarettes, and chewing tobacco. If you need help quitting, ask your health care  provider.  Keep all follow-up visits as told by your health care provider. This is important. Contact a health care provider if:  You have rapidly increasing bruising or swelling.  Your pain is not relieved with medicine. Get help right away if:  Your foot or toes become numb or blue.  You have severe pain that gets worse. Summary  An ankle sprain is a stretch or tear in a ligament in the ankle. Ligaments are tissues that connect bones to each other.  This condition is often caused by accidentally rolling or twisting the ankle.  Symptoms include pain, swelling, bruising, and trouble walking.  To relieve pain and swelling, put ice on the affected ankle, raise your ankle above the level of your heart, and use an elastic bandage.  Keep all follow-up visits as told by your health care provider. This is important. This information is not intended to replace advice given to you by your health care provider. Make sure you discuss any questions you have with your health care provider. Document Released: 09/09/2005 Document Revised: 06/02/2018 Document Reviewed: 02/03/2018 Elsevier Interactive Patient Education  2019 [employer].   Ankle Sprain, Phase I Rehab Ask your health care provider which exercises are safe for you. Do exercises exactly as told by your health care provider and adjust them as directed. It is normal to feel mild stretching, pulling, tightness, or discomfort as you do these exercises, but you should stop right away if you feel sudden pain or your pain gets worse.Do not begin these exercises until told by your health care provider. Stretching and range of motion exercises These exercises warm up your muscles and joints and improve the movement and flexibility of your lower leg and ankle. These exercises also help to relieve pain and stiffness. Exercise A: Gastroc and soleus stretch  1. Sit on the floor with your left / right leg extended. 2. Loop a belt or towel  around the ball of your left / right foot. The ball of your foot is on the walking surface, right under your toes. 3. Keep your left / right ankle and foot relaxed and keep your knee straight while you use the belt or towel to pull your foot toward you. You should feel a gentle stretch behind your calf or knee. 4. Hold this position for __________ seconds, then release to the starting position. Repeat the exercise with your knee bent. You can put a pillow or a rolled bath towel under your knee to support it. You should feel a stretch deep in your calf or at your Achilles tendon. Repeat each stretch __________ times. Complete these stretches __________ times a day. Exercise B: Ankle alphabet  1. Sit with your left / right leg supported at the lower leg. ? Do not rest your foot on anything. ? Make sure  your foot has room to move freely. 2. Think of your left / right foot as a paintbrush, and move your foot to trace each letter of the alphabet in the air. Keep your hip and knee still while you trace. Make the letters as large as you can without feeling discomfort. 3. Trace every letter from A to Z. Repeat __________ times. Complete this exercise __________ times a day. Strengthening exercises These exercises build strength and endurance in your ankle and lower leg. Endurance is the ability to use your muscles for a long time, even after they get tired. Exercise C: Dorsiflexors  1. Secure a rubber exercise band or tube to an object, such as a table leg, that will stay still when the band is pulled. Secure the other end around your left / right foot. 2. Sit on the floor facing the object, with your left / right leg extended. The band or tube should be slightly tense when your foot is relaxed. 3. Slowly bring your foot toward you, pulling the band tighter. 4. Hold this position for __________ seconds. 5. Slowly return your foot to the starting position. Repeat __________ times. Complete this exercise  __________ times a day. Exercise D: Plantar flexors  1. Sit on the floor with your left / right leg extended. 2. Loop a rubber exercise tube or band around the ball of your left / right foot. The ball of your foot is on the walking surface, right under your toes. ? Hold the ends of the band or tube in your hands. ? The band or tube should be slightly tense when your foot is relaxed. 3. Slowly point your foot and toes downward, pushing them away from you. 4. Hold this position for __________ seconds. 5. Slowly return your foot to the starting position. Repeat __________ times. Complete this exercise __________ times a day. Exercise E: Evertors 1. Sit on the floor with your legs straight out in front of you. 2. Loop a rubber exercise band or tube around the ball of your left / right foot. The ball of your foot is on the walking surface, right under your toes. ? Hold the ends of the band in your hands, or secure the band to a stable object. ? The band or tube should be slightly tense when your foot is relaxed. 3. Slowly push your foot outward, away from your other leg. 4. Hold this position for __________ seconds. 5. Slowly return your foot to the starting position. Repeat __________ times. Complete this exercise __________ times a day. This information is not intended to replace advice given to you by your health care provider. Make sure you discuss any questions you have with your health care provider. Document Released: 04/10/2005 Document Revised: 02/24/2017 Document Reviewed: 07/24/2015 Elsevier Interactive Patient Education  [employer].   If you have lab work done today you will be contacted with your lab results within the next 2 weeks.  If you have not heard from [place] then please contact us. The fastest way to get your results is to register for My Chart.   IF you received an x-ray today, you will receive an invoice from [facility]. Please contact [facility]  at [phone] with questions or concerns regarding your invoice.   IF you received labwork today, you will receive an invoice from [facility]. Please contact [facility] at [phone] with questions or concerns regarding your invoice.   Our billing staff will not be able to assist you with questions regarding  bills from these companies.  You will be contacted with the lab results as soon as they are available. The fastest way to get your results is to activate your My Chart account. Instructions are located on the last page of this paperwork. If you have not heard from [place] regarding the results in 2 weeks, please contact this office.

## 2018-12-01 NOTE — Progress Notes (Signed)
Subjective:    Patient ID: Brian Higgins, male    DOB: 1955/12/24, 63 y.o.   MRN: [id number]  HPI Brian Higgins is a 63 y.o. male Presents today for: Chief Complaint  Patient presents with  . Hyperlipidemia    6 mon f/u  . Hypertension    need a refill on micozide 12.5 mg  . Ankle Pain    patient stated he would like for you to look at his right ankle. He had a nasty fall 1 wk ago skiing    Hyperlipidemia:  Lab Results  Component Value Date   CHOL 192 05/22/2018   HDL 39 (L) 05/22/2018   LDLCALC 123 (H) 05/22/2018   TRIG 148 05/22/2018   CHOLHDL 4.9 05/22/2018   Lab Results  Component Value Date   ALT 22 05/22/2018   AST 18 05/22/2018   ALKPHOS 56 05/22/2018   BILITOT 0.7 05/22/2018  10-year ASCVD risk score 13% when checked in August.  Has been losing weight-  14# since January on his scale. Diet and cutting carbs. Wt Readings from Last 3 Encounters:  12/01/18 258 lb 12.8 oz (117.4 kg)  05/22/18 264 lb (119.7 kg)  04/07/18 263 lb (119.3 kg)    Hypertension: BP Readings from Last 3 Encounters:  12/01/18 140/86  05/22/18 134/80  04/07/18 (!) 150/84   Lab Results  Component Value Date   CREATININE 0.89 05/22/2018  HCTZ 12.5 mg daily No home readings.   Prediabetes: Lab Results  Component Value Date   HGBA1C 5.7 (H) 05/22/2018  has lost weight as above.    R ankle pain: Skiing in [place] 1 week ago.  Fell and rolled. Ski did not disengage. Pain on lateral ankle. Able to weight bear then and now. Swelling initially, improving some now. No prior ankle injury. Sore to walk down steps, but able to walk without assistive device.   Tx: ice. Ibuprofen - initially.   Review of Systems  Constitutional: Negative for fatigue and unexpected weight change.  Eyes: Negative for visual disturbance.  Respiratory: Negative for cough, chest tightness and shortness of breath.   Cardiovascular: Negative for chest pain, palpitations and leg swelling.  Gastrointestinal:  Negative for abdominal pain and blood in stool.  Musculoskeletal: Positive for arthralgias and joint swelling.  Neurological: Negative for dizziness, light-headedness and headaches.   Patient Active Problem List   Diagnosis Date Noted  . Tibialis posterior tendinitis, right 04/07/2018  . Leg length discrepancy 04/07/2018  . OA (osteoarthritis) of knee 01/13/2017  . Prostate cancer ([place]) 11/21/2012   Past Medical History:  Diagnosis Date  . Allergy   . Arthritis   . GERD (gastroesophageal reflux disease)    mild no meds  . Hypertension   . Prostate cancer [facility])    Past Surgical History:  Procedure Laterality Date  . HAND TENDON SURGERY     Right  arm  . PROSTATECTOMY    . ROTATOR CUFF REPAIR Bilateral left 2007 and right 09/2009  . TOTAL KNEE ARTHROPLASTY Left 01/13/2017   Procedure: LEFT TOTAL KNEE ARTHROPLASTY;  Surgeon: Gaynelle Arabian, MD;  Location: [facility];  Service: Orthopedics;  Laterality: Left;   Allergies  Allergen Reactions  . Other     Catgut suture did not dissolve--causes infection (2007)   Prior to Admission medications   Medication Sig Start Date End Date Taking? Authorizing Provider  cetirizine (ZYRTEC) 10 MG tablet Take 10 mg by mouth daily as needed for allergies (for seasonal allergies (pt. takes in Spring &  Fall)).    Yes [provider]  hydrochlorothiazide (MICROZIDE) 12.5 MG capsule TAKE 1 CAPSULE BY MOUTH EVERY DAY. 12/01/18  Yes Wendie Agreste, MD   Social History   Socioeconomic History  . Marital status: Married    Spouse name: Not on file  . Number of children: Not on file  . Years of education: Not on file  . Highest education level: Not on file  Occupational History  . Not on file  Social Needs  . Financial resource strain: Not on file  . Food insecurity:    Worry: Not on file    Inability: Not on file  . Transportation needs:    Medical: Not on file    Non-medical: Not on file  Tobacco Use  . Smoking status: Never Smoker    . Smokeless tobacco: Never Used  Substance and Sexual Activity  . Alcohol use: Yes    Alcohol/week: 6.0 - 7.0 standard drinks    Types: 4 Standard drinks or equivalent, 2 - 3 Glasses of wine per week    Comment: x a week  . Drug use: No  . Sexual activity: Yes  Lifestyle  . Physical activity:    Days per week: Not on file    Minutes per session: Not on file  . Stress: Not on file  Relationships  . Social connections:    Talks on phone: Not on file    Gets together: Not on file    Attends religious service: Not on file    Active member of club or organization: Not on file    Attends meetings of clubs or organizations: Not on file    Relationship status: Not on file  . Intimate partner violence:    Fear of current or ex partner: Not on file    Emotionally abused: Not on file    Physically abused: Not on file    Forced sexual activity: Not on file  Other Topics Concern  . Not on file  Social History Narrative  . Not on file       Objective:   Physical Exam Vitals signs reviewed.  Constitutional:      Appearance: He is well-developed.  HENT:     Head: Normocephalic and atraumatic.  Eyes:     Pupils: Pupils are equal, round, and reactive to light.  Neck:     Vascular: No carotid bruit or JVD.  Cardiovascular:     Rate and Rhythm: Normal rate and regular rhythm.     Heart sounds: Normal heart sounds. No murmur.  Pulmonary:     Effort: Pulmonary effort is normal.     Breath sounds: Normal breath sounds. No rales.  Musculoskeletal:     Right knee: No tenderness found.     Right ankle: He exhibits decreased range of motion (min. ) and swelling (lateral joint line. ). He exhibits no ecchymosis, no deformity and normal pulse (nvi distally). Tenderness. Lateral malleolus and AITFL (slight laxity with drawer.) tenderness found. No head of 5th metatarsal (no navicular ttp. foot nt. ) and no proximal fibula tenderness found.  Skin:    General: Skin is warm and dry.   Neurological:     Mental Status: He is alert and oriented to person, place, and time.    Vitals:   12/01/18 0825  BP: 140/86  Pulse: (!) 57  Resp: 14  Temp: (!) 97.5 F (36.4 C)  TempSrc: Oral  SpO2: 96%  Weight: 258 lb 12.8 oz (117.4 kg)  Height: 6\' 2"  (1.88 m)   Dg Ankle Complete Right  Result Date: 12/01/2018 CLINICAL DATA:  Lateral ankle pain.  Skiing 1 week ago. EXAM: RIGHT ANKLE - COMPLETE 3+ VIEW COMPARISON:  None. FINDINGS: Tiny plantar spur off the calcaneus. Mild degenerative changes in the ankle with tiny osteophytes off the lateral medial malleoli. No fractures, dislocations, or soft tissue swelling. IMPRESSION: Mild degenerative changes.  No other abnormalities. Electronically Signed   By: Dorise Bullion III M.D   On: 12/01/2018 09:24       Assessment & Plan:    Marl Seago is a 63 y.o. male Prediabetes - Plan: Hemoglobin A1c  -Check labs, consider metformin if persistent elevated A1c, commended on diet and weight loss.  Essential hypertension - Plan: hydrochlorothiazide (MICROZIDE) 12.5 MG capsule, Comprehensive metabolic panel  -Borderline, but tolerating current meds, overall stable.  No changes for now.  Labs pending  Hyperlipidemia, unspecified hyperlipidemia type - Plan: Lipid Panel  -Repeat testing as should be improved with weight loss.  Acute right ankle pain - Plan: DG Ankle Complete Right Sprain of right ankle, unspecified ligament, initial encounter - Plan: Apply ASO ankle  -Probable ATF sprain.  Overall reassuring x-ray, has had some improvement in symptoms with home range of motion and stretching.  Handout given, ASO brace as needed temporarily with range of motion out of brace daily.  RTC precautions   Meds ordered this encounter  Medications  . hydrochlorothiazide (MICROZIDE) 12.5 MG capsule    Sig: TAKE 1 CAPSULE BY MOUTH EVERY DAY.    Dispense:  90 capsule    Refill:  3   Patient Instructions    Blood pressure borderline elevated,  but no changes for now.  Will check cholesterol test and prediabetes test again.  Expect those numbers to be better.  If still at prediabetes range, we have the option of low-dose metformin.  X-ray overall reassuring.  I suspect you have a lateral ankle sprain.  See information below.  Ankle brace as needed for stability, but come out of the brace a few times per day with range of motion.  Follow-up in the next 2 to 3 weeks if not improving.  Sooner if worse.  Occasional ibuprofen only if needed.   Ankle Sprain  An ankle sprain is a stretch or tear in a ligament in the ankle. Ligaments are tissues that connect bones to each other. The two most common types of ankle sprains are:  Inversion sprain. This happens when the foot turns inward and the ankle rolls outward. It affects the ligament on the outside of the foot (lateral ligament).  Eversion sprain. This happens when the foot turns outward and the ankle rolls inward. It affects the ligament on the inner side of the foot (medial ligament). What are the causes? This condition is often caused by accidentally rolling or twisting the ankle. What increases the risk? You are more likely to develop this condition if you play sports. What are the signs or symptoms? Symptoms of this condition include:  Pain in your ankle.  Swelling.  Bruising. This may develop right after you sprain your ankle or 1-2 days later.  Trouble standing or walking, especially when you turn or change directions. How is this diagnosed? This condition is diagnosed with:  A physical exam. During the exam, your health care provider will press on certain parts of your foot and ankle and try to move them in certain ways.  X-ray imaging. These may be taken to see  how severe the sprain is and to check for broken bones. How is this treated? This condition may be treated with:  A brace or splint. This is used to keep the ankle from moving until it heals.  An elastic  bandage. This is used to support the ankle.  Crutches.  Pain medicine.  Surgery. This may be needed if the sprain is severe.  Physical therapy. This may help to improve the range of motion in the ankle. Follow these instructions at home: If you have a brace or a splint:  Wear the brace or splint as told by your health care provider. Remove it only as told by your health care provider.  Loosen the brace or splint if your toes tingle, become numb, or turn cold and blue.  Keep the brace or splint clean.  If the brace or splint is not waterproof: ? Do not let it get wet. ? Cover it with a watertight covering when you take a bath or a shower. If you have an elastic bandage (dressing):  Remove it to shower or bathe.  Try not to move your ankle much, but wiggle your toes from time to time. This helps to prevent swelling.  Adjust the dressing to make it more comfortable if it feels too tight.  Loosen the dressing if you have numbness or tingling in your foot, or if your foot becomes cold and blue. Managing pain, stiffness, and swelling   Take over-the-counter and prescription medicines only as told by your health care provider.  For 2-3 days, keep your ankle raised (elevated) above the level of your heart as much as possible.  If directed, put ice on the injured area: ? If you have a removable brace or splint, remove it as told by your health care provider. ? Put ice in a plastic bag. ? Place a towel between your skin and the bag. ? Leave the ice on for 20 minutes, 2-3 times a day. General instructions  Rest your ankle.  Do not use the injured limb to support your body weight until your health care provider says that you can. Use crutches as told by your health care provider.  Do not use any products that contain nicotine or tobacco, such as cigarettes, e-cigarettes, and chewing tobacco. If you need help quitting, ask your health care provider.  Keep all follow-up visits as  told by your health care provider. This is important. Contact a health care provider if:  You have rapidly increasing bruising or swelling.  Your pain is not relieved with medicine. Get help right away if:  Your foot or toes become numb or blue.  You have severe pain that gets worse. Summary  An ankle sprain is a stretch or tear in a ligament in the ankle. Ligaments are tissues that connect bones to each other.  This condition is often caused by accidentally rolling or twisting the ankle.  Symptoms include pain, swelling, bruising, and trouble walking.  To relieve pain and swelling, put ice on the affected ankle, raise your ankle above the level of your heart, and use an elastic bandage.  Keep all follow-up visits as told by your health care provider. This is important. This information is not intended to replace advice given to you by your health care provider. Make sure you discuss any questions you have with your health care provider. Document Released: 09/09/2005 Document Revised: 06/02/2018 Document Reviewed: 02/03/2018 Elsevier Interactive Patient Education  2019 [place].   Ankle Sprain, Phase  I Rehab Ask your health care provider which exercises are safe for you. Do exercises exactly as told by your health care provider and adjust them as directed. It is normal to feel mild stretching, pulling, tightness, or discomfort as you do these exercises, but you should stop right away if you feel sudden pain or your pain gets worse.Do not begin these exercises until told by your health care provider. Stretching and range of motion exercises These exercises warm up your muscles and joints and improve the movement and flexibility of your lower leg and ankle. These exercises also help to relieve pain and stiffness. Exercise A: Gastroc and soleus stretch  1. Sit on the floor with your left / right leg extended. 2. Loop a belt or towel around the ball of your left / right foot. The  ball of your foot is on the walking surface, right under your toes. 3. Keep your left / right ankle and foot relaxed and keep your knee straight while you use the belt or towel to pull your foot toward you. You should feel a gentle stretch behind your calf or knee. 4. Hold this position for __________ seconds, then release to the starting position. Repeat the exercise with your knee bent. You can put a pillow or a rolled bath towel under your knee to support it. You should feel a stretch deep in your calf or at your Achilles tendon. Repeat each stretch __________ times. Complete these stretches __________ times a day. Exercise B: Ankle alphabet  1. Sit with your left / right leg supported at the lower leg. ? Do not rest your foot on anything. ? Make sure your foot has room to move freely. 2. Think of your left / right foot as a paintbrush, and move your foot to trace each letter of the alphabet in the air. Keep your hip and knee still while you trace. Make the letters as large as you can without feeling discomfort. 3. Trace every letter from A to Z. Repeat __________ times. Complete this exercise __________ times a day. Strengthening exercises These exercises build strength and endurance in your ankle and lower leg. Endurance is the ability to use your muscles for a long time, even after they get tired. Exercise C: Dorsiflexors  1. Secure a rubber exercise band or tube to an object, such as a table leg, that will stay still when the band is pulled. Secure the other end around your left / right foot. 2. Sit on the floor facing the object, with your left / right leg extended. The band or tube should be slightly tense when your foot is relaxed. 3. Slowly bring your foot toward you, pulling the band tighter. 4. Hold this position for __________ seconds. 5. Slowly return your foot to the starting position. Repeat __________ times. Complete this exercise __________ times a day. Exercise D: Plantar  flexors  1. Sit on the floor with your left / right leg extended. 2. Loop a rubber exercise tube or band around the ball of your left / right foot. The ball of your foot is on the walking surface, right under your toes. ? Hold the ends of the band or tube in your hands. ? The band or tube should be slightly tense when your foot is relaxed. 3. Slowly point your foot and toes downward, pushing them away from you. 4. Hold this position for __________ seconds. 5. Slowly return your foot to the starting position. Repeat __________ times. Complete this exercise  __________ times a day. Exercise E: Evertors 1. Sit on the floor with your legs straight out in front of you. 2. Loop a rubber exercise band or tube around the ball of your left / right foot. The ball of your foot is on the walking surface, right under your toes. ? Hold the ends of the band in your hands, or secure the band to a stable object. ? The band or tube should be slightly tense when your foot is relaxed. 3. Slowly push your foot outward, away from your other leg. 4. Hold this position for __________ seconds. 5. Slowly return your foot to the starting position. Repeat __________ times. Complete this exercise __________ times a day. This information is not intended to replace advice given to you by your health care provider. Make sure you discuss any questions you have with your health care provider. Document Released: 04/10/2005 Document Revised: 02/24/2017 Document Reviewed: 07/24/2015 Elsevier Interactive Patient Education  [employer].   If you have lab work done today you will be contacted with your lab results within the next 2 weeks.  If you have not heard from [place] then please contact us. The fastest way to get your results is to register for My Chart.   IF you received an x-ray today, you will receive an invoice from [facility]. Please contact [facility] at [phone] with questions or concerns  regarding your invoice.   IF you received labwork today, you will receive an invoice from [place] [facility]. Please contact [facility] at [phone] with questions or concerns regarding your invoice.   Our billing staff will not be able to assist you with questions regarding bills from these companies.  You will be contacted with the lab results as soon as they are available. The fastest way to get your results is to activate your My Chart account. Instructions are located on the last page of this paperwork. If you have not heard from [place] regarding the results in 2 weeks, please contact this office.       Signed,   [name], MD [facility] [place].  12/01/18 9:34 AM

## 2018-12-02 LAB — COMPREHENSIVE METABOLIC PANEL
ALT: 30 IU/L (ref 0–44)
AST: 23 IU/L (ref 0–40)
Albumin/Globulin Ratio: 2.1 (ref 1.2–2.2)
Albumin: 4.5 g/dL (ref 3.8–4.8)
Alkaline Phosphatase: 57 IU/L (ref 39–117)
BUN/Creatinine Ratio: 30 — ABNORMAL HIGH (ref 10–24)
BUN: 27 mg/dL (ref 8–27)
Bilirubin Total: 0.6 mg/dL (ref 0.0–1.2)
CO2: 21 mmol/L (ref 20–29)
Calcium: 9.6 mg/dL (ref 8.6–10.2)
Chloride: 108 mmol/L — ABNORMAL HIGH (ref 96–106)
Creatinine, Ser: 0.91 mg/dL (ref 0.76–1.27)
GFR calc Af Amer: 104 mL/min/{1.73_m2} (ref >59)
GFR calc non Af Amer: 90 mL/min/{1.73_m2} (ref >59)
Globulin, Total: 2.1 g/dL (ref 1.5–4.5)
Glucose: 105 mg/dL — ABNORMAL HIGH (ref 65–99)
Potassium: 4.4 mmol/L (ref 3.5–5.2)
Sodium: 144 mmol/L (ref 134–144)
Total Protein: 6.6 g/dL (ref 6.0–8.5)

## 2018-12-02 LAB — LIPID PANEL
Chol/HDL Ratio: 4 ratio (ref 0.0–5.0)
Cholesterol, Total: 176 mg/dL (ref 100–199)
HDL: 44 mg/dL (ref >39)
LDL Calculated: 111 mg/dL — ABNORMAL HIGH (ref 0–99)
Triglycerides: 107 mg/dL (ref 0–149)
VLDL Cholesterol Cal: 21 mg/dL (ref 5–40)

## 2018-12-02 LAB — HEMOGLOBIN A1C
Est. average glucose Bld gHb Est-mCnc: 114 mg/dL
Hgb A1c MFr Bld: 5.6 % (ref 4.8–5.6)

## 2019-07-13 IMAGING — DX RIGHT ANKLE - COMPLETE 3+ VIEW
3 series · 3 of 3 positions shown · non-contrast
Comparison: None.

CLINICAL DATA: Lateral ankle pain.  Skiing 1 week ago.

EXAM:
RIGHT ANKLE - COMPLETE 3+ VIEW

[ankle ap]
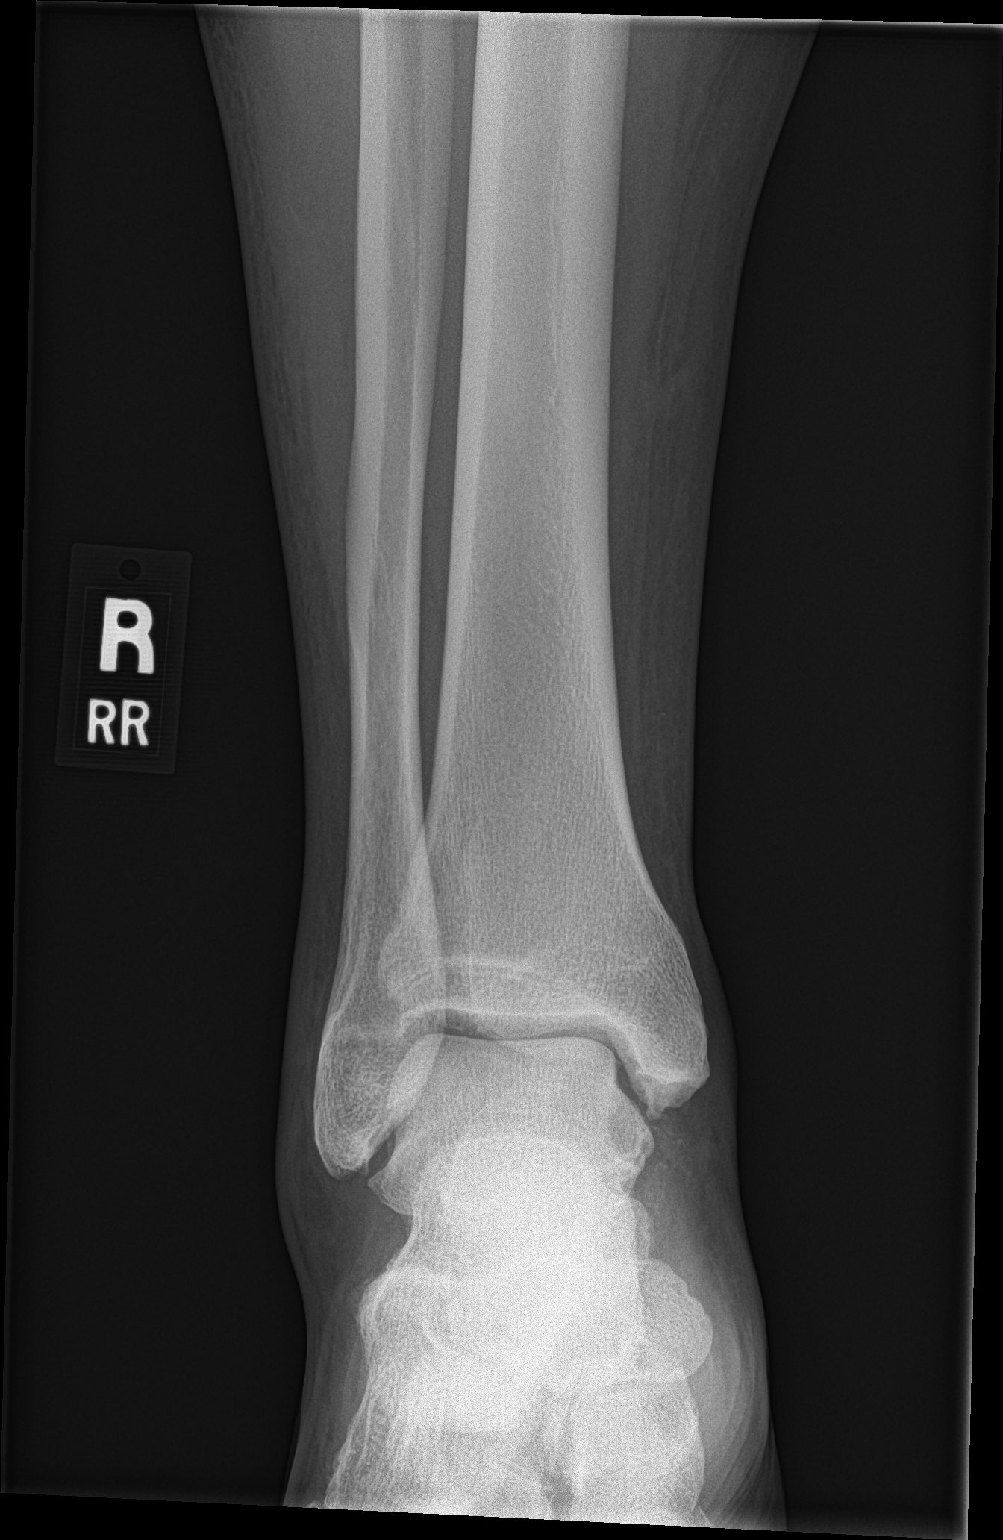

[ankle obl]
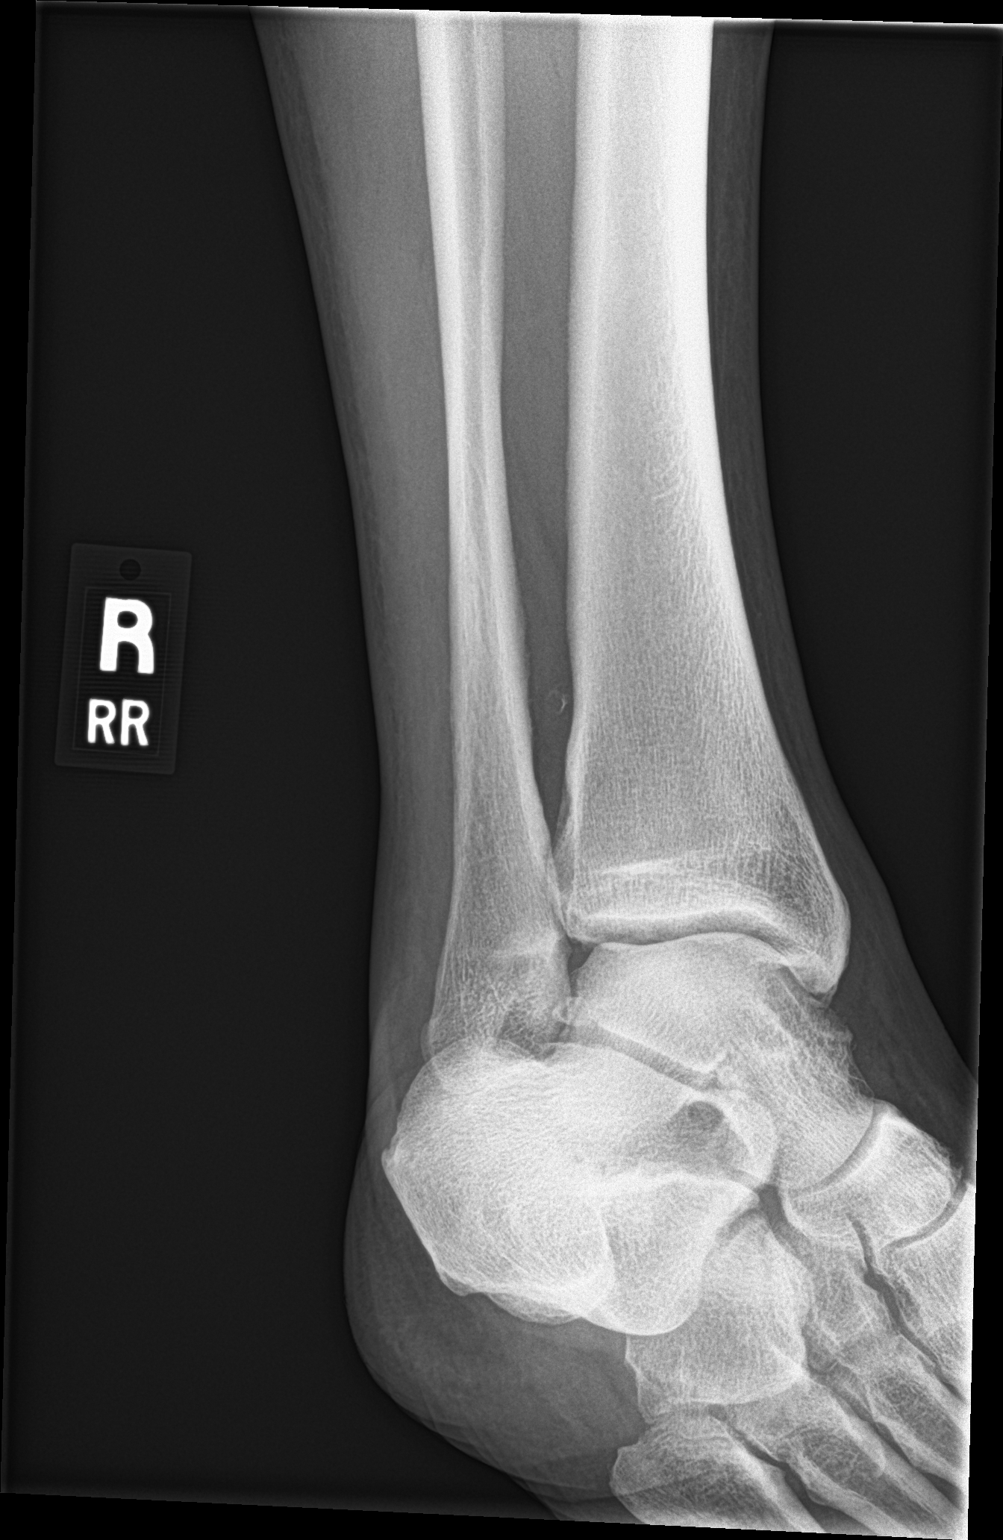

[ankle lat]
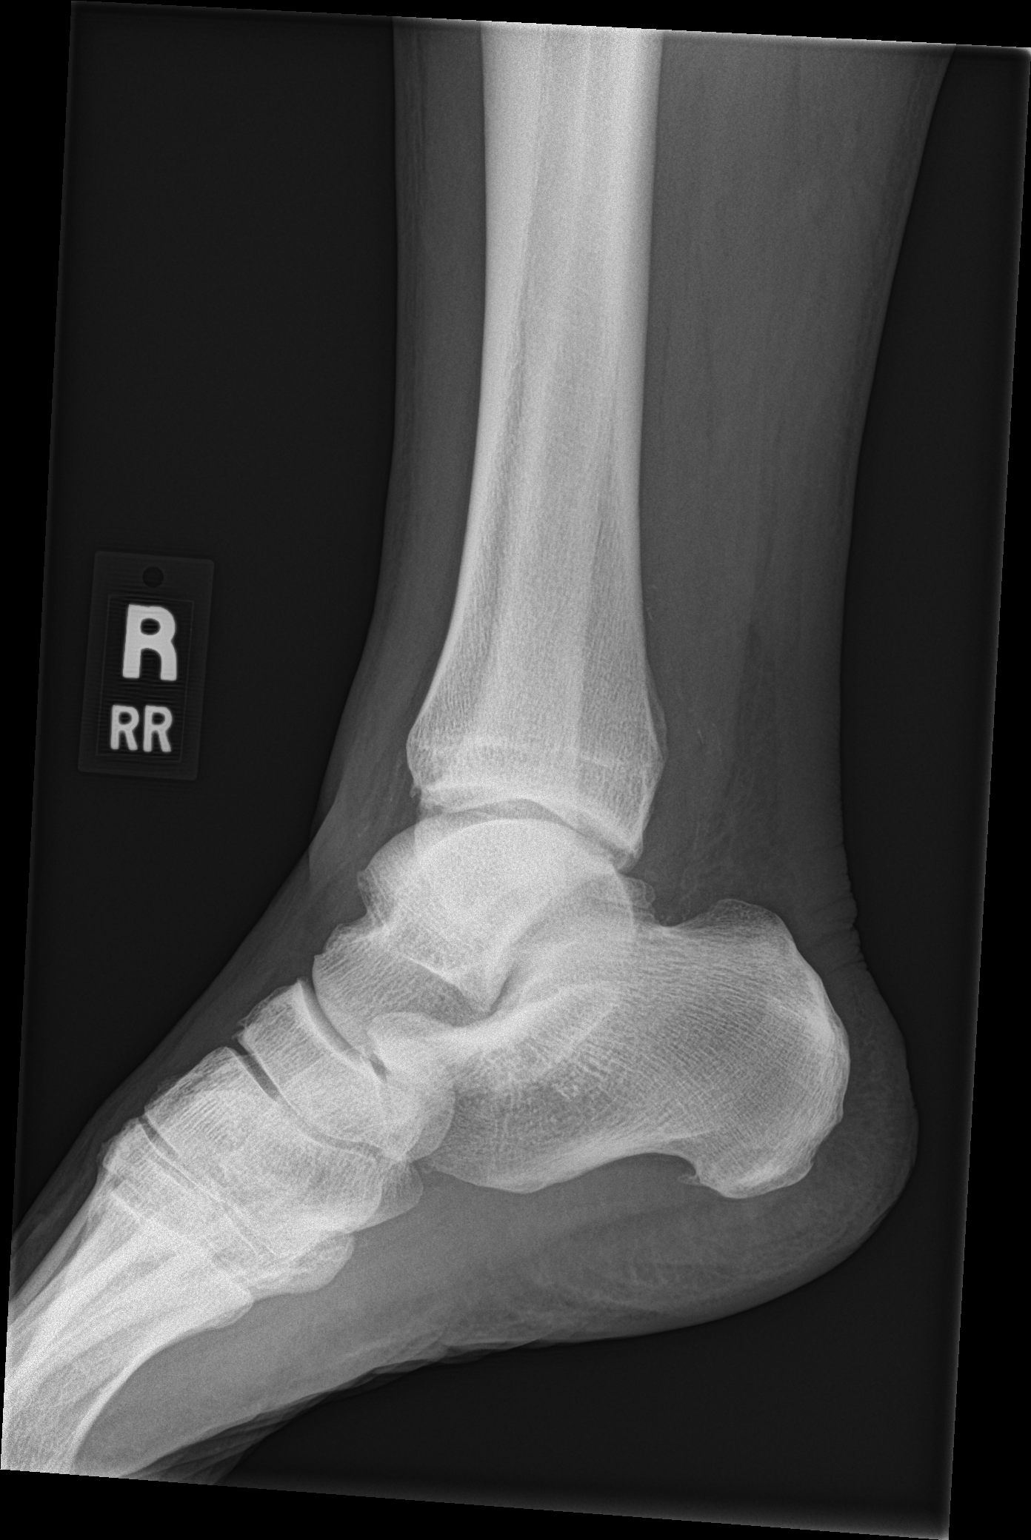

[3 of 3 positions shown; findings below may reference images not displayed]

FINDINGS: Tiny plantar spur off the calcaneus. Mild degenerative changes in
the ankle with tiny osteophytes off the lateral medial malleoli. No
fractures, dislocations, or soft tissue swelling.
IMPRESSION: Mild degenerative changes.  No other abnormalities.

## 2019-12-19 ENCOUNTER — Other Ambulatory Visit: Payer: Self-pay | Admitting: Family Medicine

## 2019-12-19 DIAGNOSIS — I10 Essential (primary) hypertension: Secondary | ICD-10-CM

## 2019-12-19 NOTE — Telephone Encounter (Signed)
Requested medications are due for refill today?  Yes  Requested medications are on active medication list?  Yes  Last Refill:  12/01/2018   # 90 with 3 refills   Future visit scheduled?  No   Notes to Clinic:  Refill failed protocol due to outdated lab work and patient being past due for office visit.

## 2020-01-19 ENCOUNTER — Other Ambulatory Visit: Payer: Self-pay | Admitting: Family Medicine

## 2020-01-19 DIAGNOSIS — I10 Essential (primary) hypertension: Secondary | ICD-10-CM

## 2020-01-19 NOTE — Telephone Encounter (Signed)
Requested medications are due for refill today?  Yes  Requested medications are on active medication list?  Yes  Last Refill:  12/20/2019 # 30 with no refills - Notation with this refill:    Pt needs an appointment for more refills.  Future visit scheduled? No   Notes to Clinic:  Medication failed RX refill protocol due to no valid encounter in 6 months and no lab work within 360 days.  Last labs performed on 12/01/2018.

## 2020-01-27 ENCOUNTER — Encounter: Payer: Self-pay | Admitting: Family Medicine

## 2020-01-27 ENCOUNTER — Ambulatory Visit: Payer: Managed Care, Other (non HMO) | Admitting: Family Medicine

## 2020-01-27 ENCOUNTER — Other Ambulatory Visit: Payer: Self-pay

## 2020-01-27 VITALS — BP 131/78 | HR 58 | Temp 98.0°F | Ht 74.0 in | Wt 262.0 lb

## 2020-01-27 DIAGNOSIS — S46811A Strain of other muscles, fascia and tendons at shoulder and upper arm level, right arm, initial encounter: Secondary | ICD-10-CM

## 2020-01-27 DIAGNOSIS — E785 Hyperlipidemia, unspecified: Secondary | ICD-10-CM

## 2020-01-27 DIAGNOSIS — R7303 Prediabetes: Secondary | ICD-10-CM

## 2020-01-27 DIAGNOSIS — I10 Essential (primary) hypertension: Secondary | ICD-10-CM | POA: Diagnosis not present

## 2020-01-27 MED ORDER — HYDROCHLOROTHIAZIDE 12.5 MG PO CAPS: 12.5000 mg | ORAL_CAPSULE | Freq: Every day | ORAL | 3 refills | Status: DC

## 2020-01-27 NOTE — Patient Instructions (Addendum)
No change in meds for now  Deltoid muscle strain/overuse likely. Relative rest for now, then restart activity as tolerated slowly.  If concerns on labs I will let you know.  Thanks for coming in today.   If you have lab work done today you will be contacted with your lab results within the next 2 weeks.  If you have not heard from [place] then please contact us. The fastest way to get your results is to register for My Chart.   IF you received an x-ray today, you will receive an invoice from [facility]. Please contact [facility] at [phone] with questions or concerns regarding your invoice.   IF you received labwork today, you will receive an invoice from [place]. Please contact [facility] at [phone] with questions or concerns regarding your invoice.   Our billing staff will not be able to assist you with questions regarding bills from these companies.  You will be contacted with the lab results as soon as they are available. The fastest way to get your results is to activate your My Chart account. Instructions are located on the last page of this paperwork. If you have not heard from [place] regarding the results in 2 weeks, please contact this office.

## 2020-01-27 NOTE — Progress Notes (Signed)
Subjective:  Patient ID: Brian Higgins, male    DOB: 30-Dec-1955  Age: 64 y.o. MRN: [id number]  CC:  Chief Complaint  Patient presents with  . Medication Refill    on hydrochlorothiazide. pt states his BP has been steady staying within normal range. pt reports no known side effects from the medication. pt reports no physical syptoms of hypertension.    HPI Brian Higgins presents for   Hypertension: hctz 12.5mg  qd. No new side effects.  Home readings: 130/70-80.  BP Readings from Last 3 Encounters:  01/27/20 131/78  12/01/18 140/86  05/22/18 134/80   Lab Results  Component Value Date   CREATININE 0.91 12/01/2018   Some shoulder soreness after tennis, increased cutting/yardwork. Using band exercises from RTC surgery.  Doing better.  Prediabetes:  Normal on testing today.  Last ate 5.5 hrs ago.  Wt Readings from Last 3 Encounters:  01/27/20 262 lb (118.8 kg)  12/01/18 258 lb 12.8 oz (117.4 kg)  05/22/18 264 lb (119.7 kg)   Lab Results  Component Value Date   HGBA1C 5.6 12/01/2018   Hyperlipidemia: Mild elevated last year. Not currently on statin.  Riding bike, tennis 3-4 days per week.  The 10-year ASCVD risk score Mikey Bussing DC Brooke Bonito., et al., 2013) is: 13%   Values used to calculate the score:     Age: 78 years     Sex: Male     Is Non-Hispanic African American: No     Diabetic: No     Tobacco smoker: No     Systolic Blood Pressure: [id number] mmHg     Is BP treated: Yes     HDL Cholesterol: 44 mg/dL     Total Cholesterol: 176 mg/dL  Lab Results  Component Value Date   CHOL 176 12/01/2018   HDL 44 12/01/2018   LDLCALC 111 (H) 12/01/2018   TRIG 107 12/01/2018   CHOLHDL 4.0 12/01/2018   Lab Results  Component Value Date   ALT 30 12/01/2018   AST 23 12/01/2018   ALKPHOS 57 12/01/2018   BILITOT 0.6 12/01/2018     History Patient Active Problem List   Diagnosis Date Noted  . Tibialis posterior tendinitis, right 04/07/2018  . Leg length discrepancy 04/07/2018    . OA (osteoarthritis) of knee 01/13/2017  . Prostate cancer ([facility]) 11/21/2012   Past Medical History:  Diagnosis Date  . Allergy   . Arthritis   . GERD (gastroesophageal reflux disease)    mild no meds  . Hypertension   . Prostate cancer [facility])    Past Surgical History:  Procedure Laterality Date  . HAND TENDON SURGERY     Right  arm  . PROSTATECTOMY    . ROTATOR CUFF REPAIR Bilateral left 2007 and right 09/2009  . TOTAL KNEE ARTHROPLASTY Left 01/13/2017   Procedure: LEFT TOTAL KNEE ARTHROPLASTY;  Surgeon: Gaynelle Arabian, MD;  Location: [facility];  Service: Orthopedics;  Laterality: Left;   Allergies  Allergen Reactions  . Other     Catgut suture did not dissolve--causes infection (2007)   Prior to Admission medications   Medication Sig Start Date End Date Taking? Authorizing Provider  cetirizine (ZYRTEC) 10 MG tablet Take 10 mg by mouth daily as needed for allergies (for seasonal allergies (pt. takes in Spring & Fall)).    Yes [provider]  hydrochlorothiazide (MICROZIDE) 12.5 MG capsule TAKE 1 CAPSULE BY MOUTH EVERY DAY 01/19/20  Yes Wendie Agreste, MD   Social History   Socioeconomic History  .  Marital status: Married    Spouse name: Not on file  . Number of children: Not on file  . Years of education: Not on file  . Highest education level: Not on file  Occupational History  . Not on file  Tobacco Use  . Smoking status: Never Smoker  . Smokeless tobacco: Never Used  Substance and Sexual Activity  . Alcohol use: Yes    Alcohol/week: 6.0 - 7.0 standard drinks    Types: 4 Standard drinks or equivalent, 2 - 3 Glasses of wine per week    Comment: x a week  . Drug use: No  . Sexual activity: Yes  Other Topics Concern  . Not on file  Social History Narrative  . Not on file   Social Determinants of Health   Financial Resource Strain:   . Difficulty of Paying Living Expenses:   Food Insecurity:   . Worried About Charity fundraiser in the Last Year:    . Arboriculturist in the Last Year:   Transportation Needs:   . Film/video editor (Medical):   Marland Kitchen Lack of Transportation (Non-Medical):   Physical Activity:   . Days of Exercise per Week:   . Minutes of Exercise per Session:   Stress:   . Feeling of Stress :   Social Connections:   . Frequency of Communication with Friends and Family:   . Frequency of Social Gatherings with Friends and Family:   . Attends Religious Services:   . Active Member of Clubs or Organizations:   . Attends Archivist Meetings:   Marland Kitchen Marital Status:   Intimate Partner Violence:   . Fear of Current or Ex-Partner:   . Emotionally Abused:   Marland Kitchen Physically Abused:   . Sexually Abused:     Review of Systems   Objective:   Vitals:   01/27/20 1150  BP: 131/78  Pulse: (!) 58  Temp: 98 F (36.7 C)  TempSrc: Temporal  SpO2: 99%  Weight: 262 lb (118.8 kg)  Height: 6\' 2"  (1.88 m)     Physical Exam Vitals reviewed.  Constitutional:      Appearance: He is well-developed.  HENT:     Head: Normocephalic and atraumatic.  Eyes:     Pupils: Pupils are equal, round, and reactive to light.  Neck:     Vascular: No carotid bruit or JVD.  Cardiovascular:     Rate and Rhythm: Normal rate and regular rhythm.     Heart sounds: Normal heart sounds. No murmur.  Pulmonary:     Effort: Pulmonary effort is normal.     Breath sounds: Normal breath sounds. No rales.  Musculoskeletal:     Comments: Right shoulder  Full rotator cuff strength.  Discomfort with arm abduction.  Slight tenderness at the anterior/lateral deltoid origin.  No defect.  Strength intact.  Negative empty can. No clavicle or other bony ttp.   Skin:    General: Skin is warm and dry.  Neurological:     Mental Status: He is alert and oriented to person, place, and time.        Assessment & Plan:  Brian Higgins is a 64 y.o. male . Prediabetes - Plan: Hemoglobin A1c  -Minimally changed since last check 1 A1c at the higher end  of normal.  Repeat testing.  Continue exercise and watching diet  Essential hypertension - Plan: hydrochlorothiazide (MICROZIDE) 12.5 MG capsule  -Stable, continue HCTZ same dose.  Hyperlipidemia, unspecified hyperlipidemia type -  Plan: Comprehensive metabolic panel, Lipid panel  -Borderline ASCVD risk score previously, repeat labs.  Strain of right deltoid muscle, initial encounter  -Suspected overuse versus strain of deltoid, has had some improvement with home treatments and Thera-Band exercises.  Avoid resistive abduction exercises initially, relative rest with range of motion and RTC precautions given.   Meds ordered this encounter  Medications  . hydrochlorothiazide (MICROZIDE) 12.5 MG capsule    Sig: Take 1 capsule (12.5 mg total) by mouth daily.    Dispense:  90 capsule    Refill:  3   Patient Instructions   No change in meds for now  Deltoid muscle strain/overuse likely. Relative rest for now, then restart activity as tolerated slowly.  If concerns on labs I will let you know.  Thanks for coming in today.   If you have lab work done today you will be contacted with your lab results within the next 2 weeks.  If you have not heard from [place] then please contact us. The fastest way to get your results is to register for My Chart.   IF you received an x-ray today, you will receive an invoice from [facility]. Please contact [facility] at [phone] with questions or concerns regarding your invoice.   IF you received labwork today, you will receive an invoice from [facility]. Please contact [facility] at [phone] with questions or concerns regarding your invoice.   Our billing staff will not be able to assist you with questions regarding bills from these companies.  You will be contacted with the lab results as soon as they are available. The fastest way to get your results is to activate your My Chart account. Instructions are located on the last page of this  paperwork. If you have not heard from [place] regarding the results in 2 weeks, please contact this office.         Signed, [name], MD [facility]

## 2020-01-28 LAB — HEMOGLOBIN A1C
Est. average glucose Bld gHb Est-mCnc: 111 mg/dL
Hgb A1c MFr Bld: 5.5 % (ref 4.8–5.6)

## 2020-01-28 LAB — COMPREHENSIVE METABOLIC PANEL
ALT: 23 IU/L (ref 0–44)
AST: 19 IU/L (ref 0–40)
Albumin/Globulin Ratio: 2.7 — ABNORMAL HIGH (ref 1.2–2.2)
Albumin: 4.8 g/dL (ref 3.8–4.8)
Alkaline Phosphatase: 48 IU/L (ref 39–117)
BUN/Creatinine Ratio: 28 — ABNORMAL HIGH (ref 10–24)
BUN: 27 mg/dL (ref 8–27)
Bilirubin Total: 0.5 mg/dL (ref 0.0–1.2)
CO2: 21 mmol/L (ref 20–29)
Calcium: 9.4 mg/dL (ref 8.6–10.2)
Chloride: 105 mmol/L (ref 96–106)
Creatinine, Ser: 0.96 mg/dL (ref 0.76–1.27)
GFR calc Af Amer: 97 mL/min/{1.73_m2} (ref >59)
GFR calc non Af Amer: 84 mL/min/{1.73_m2} (ref >59)
Globulin, Total: 1.8 g/dL (ref 1.5–4.5)
Glucose: 84 mg/dL (ref 65–99)
Potassium: 4 mmol/L (ref 3.5–5.2)
Sodium: 140 mmol/L (ref 134–144)
Total Protein: 6.6 g/dL (ref 6.0–8.5)

## 2020-01-28 LAB — LIPID PANEL
Chol/HDL Ratio: 3.7 ratio (ref 0.0–5.0)
Cholesterol, Total: 160 mg/dL (ref 100–199)
HDL: 43 mg/dL (ref >39)
LDL Chol Calc (NIH): 100 mg/dL — ABNORMAL HIGH (ref 0–99)
Triglycerides: 92 mg/dL (ref 0–149)
VLDL Cholesterol Cal: 17 mg/dL (ref 5–40)

## 2020-03-06 ENCOUNTER — Other Ambulatory Visit: Payer: Self-pay

## 2020-03-06 ENCOUNTER — Ambulatory Visit: Payer: Managed Care, Other (non HMO) | Admitting: Family Medicine

## 2020-03-06 ENCOUNTER — Encounter: Payer: Self-pay | Admitting: Family Medicine

## 2020-03-06 ENCOUNTER — Ambulatory Visit: Payer: Self-pay

## 2020-03-06 VITALS — BP 130/84 | HR 80 | Ht 74.0 in | Wt 262.0 lb

## 2020-03-06 DIAGNOSIS — M25511 Pain in right shoulder: Secondary | ICD-10-CM | POA: Diagnosis not present

## 2020-03-06 NOTE — Patient Instructions (Addendum)
Thank you for coming in today. Call or go to the ER if you develop a large red swollen joint with extreme pain or oozing puss.  Continue home exercises.  Let me know if you want me to order PT. Let me know where you want to go. [facility] [place].  Next step after PT is xray and MRI.

## 2020-03-06 NOTE — Progress Notes (Signed)
Brian Higgins, am serving as a Education administrator for Dr. Lynne Leader.  Theseus Birnie is a 64 y.o. male who presents to [place] at [facility] today for R shoulder injury. Patient usually seen by Dr. Tamala Julian, was last seen by him on 04/07/2018 for leg and foot pain. Patient being seen today for R shoulder pain caused by falling playing tennis a month ago. Pain located to front of shoulder didn't play for a couple weeks felt okay then played in a tournament. Certain motions makes it hurts worse. Describes pain as a dull aching pain, taking ibuprofen .    Pertinent review of systems: No fevers or chills  Relevant historical information: rotator cuff surgery on R shoulder 2011   Exam:  BP 130/84 (BP Location: Left Arm, Patient Position: Sitting, Cuff Size: Normal)   Pulse 80   Ht 6\' 2"  (1.88 m)   Wt 262 lb (118.8 kg)   BMI 33.64 kg/m  General: Well Developed, well nourished, and in no acute distress.   MSK: C-spine normal-appearing nontender normal motion. Right shoulder normal-appearing Nontender. Range of motion abduction 140 degrees.  External rotation full.  Internal rotation to lumbar spine. Strength 4/5 abduction.  5/5 external and internal rotation. Mildly positive Hawkins and Neer's test.  Mildly positive empty can test. Negative Yergason's and speeds test.    Lab and Radiology Results . Diagnostic Limited MSK Ultrasound of: Right shoulder Biceps tendon intact in bicipital groove.  Tendon surrounded by hypoechoic fluid within tendon sheath. Subscapularis tendon intact normal-appearing Supraspinous tendon is intact without obvious tear.  Mild increased subacromial bursa thickness present. Infraspinatus tendon is intact. AC joint normal-appearing Impression: Biceps tenosynovitis and subacromial bursitis  Procedure: Real-time Ultrasound Guided Injection of right shoulder subacromial bursa Device: Philips Affiniti 50G Images permanently stored and available  for review in the ultrasound unit. Verbal informed consent obtained.  Discussed risks and benefits of procedure. Warned about infection bleeding damage to structures skin hypopigmentation and fat atrophy among others. Patient expresses understanding and agreement Time-out conducted.   Noted no overlying erythema, induration, or other signs of local infection.   Skin prepped in a sterile fashion.   Local anesthesia: Topical Ethyl chloride.   With sterile technique and under real time ultrasound guidance:  40 mg of Kenalog and 2 mL of Marcaine injected easily.   Completed without difficulty   Pain immediately resolved suggesting accurate placement of the medication.   Advised to call if fevers/chills, erythema, induration, drainage, or persistent bleeding.   Images permanently stored and available for review in the ultrasound unit.  Impression: Technically successful ultrasound guided injection.        Assessment and Plan: 64 y.o. male with right shoulder pain after fall.  No obvious full-thickness rotator cuff tear on ultrasound today.  Physical exam were consistent with rotator cuff tendinopathy/bursitis.  Not much signs or symptoms on physical exam of biceps tendinitis however patient did have evidence on ultrasound. Plan for treatment with subacromial injection as above.  Additionally continue home exercise program.  If not improving patient will let me know in a week or 2 and we will proceed with formal physical therapy.  Patient has established relationship at [facility] [place].  Next step if not better with physical therapy would likely be x-ray and MRI.   PDMP not reviewed this encounter. Orders Placed This Encounter  Procedures  . [place] LIMITED JOINT SPACE STRUCTURES UP RIGHT(NO LINKED CHARGES)    Order Specific Question:  Reason for Exam (SYMPTOM  OR DIAGNOSIS REQUIRED)    Answer:   pain    Order Specific Question:   Preferred imaging location?    Answer:   [place]   No orders of the defined types were placed in this encounter.    Discussed warning signs or symptoms. Please see discharge instructions. Patient expresses understanding.   The above documentation has been reviewed and is accurate and complete Lynne Leader, M.D.

## 2020-08-02 ENCOUNTER — Encounter: Payer: Self-pay | Admitting: Family Medicine

## 2020-08-02 ENCOUNTER — Other Ambulatory Visit: Payer: Self-pay

## 2020-08-02 ENCOUNTER — Ambulatory Visit (INDEPENDENT_AMBULATORY_CARE_PROVIDER_SITE_OTHER): Payer: Managed Care, Other (non HMO)

## 2020-08-02 ENCOUNTER — Ambulatory Visit (INDEPENDENT_AMBULATORY_CARE_PROVIDER_SITE_OTHER): Payer: Managed Care, Other (non HMO) | Admitting: Family Medicine

## 2020-08-02 VITALS — BP 124/86 | HR 68 | Temp 97.3°F | Ht 74.0 in | Wt 260.0 lb

## 2020-08-02 DIAGNOSIS — Z0001 Encounter for general adult medical examination with abnormal findings: Secondary | ICD-10-CM | POA: Diagnosis not present

## 2020-08-02 DIAGNOSIS — I10 Essential (primary) hypertension: Secondary | ICD-10-CM

## 2020-08-02 DIAGNOSIS — R7303 Prediabetes: Secondary | ICD-10-CM

## 2020-08-02 DIAGNOSIS — Z0000 Encounter for general adult medical examination without abnormal findings: Secondary | ICD-10-CM

## 2020-08-02 DIAGNOSIS — M25561 Pain in right knee: Secondary | ICD-10-CM

## 2020-08-02 DIAGNOSIS — Z8546 Personal history of malignant neoplasm of prostate: Secondary | ICD-10-CM

## 2020-08-02 DIAGNOSIS — E785 Hyperlipidemia, unspecified: Secondary | ICD-10-CM

## 2020-08-02 MED ORDER — MELOXICAM 7.5 MG PO TABS: 7.5000 mg | ORAL_TABLET | Freq: Every day | ORAL | 0 refills | Status: DC

## 2020-08-02 NOTE — Progress Notes (Signed)
Subjective:  Patient ID: Brian Higgins, male    DOB: February 26, 1956  Age: 64 y.o. MRN: [id number]  CC:  Chief Complaint  Patient presents with  . Annual Exam    Pt reports he feels well other than his R knee has been "cetching" recently.    HPI Brian Higgins presents for  Annual physical exam  Hypertension: Hydrochlorothiazide 12.5 mg daily Home readings: none. Took meds late this am. No missed doses. No side effects.  BP Readings from Last 3 Encounters:  08/02/20 124/86  03/06/20 130/84  01/27/20 131/78   Lab Results  Component Value Date   CREATININE 0.96 01/27/2020   Prediabetes: History of prediabetes, most recent A1c's have been normal.  Weight improved 2 pounds from last visit.  Lab Results  Component Value Date   HGBA1C 5.5 01/27/2020   HGBA1C 5.6 12/01/2018   HGBA1C 5.7 (H) 05/22/2018   Wt Readings from Last 3 Encounters:  08/02/20 260 lb (117.9 kg)  03/06/20 262 lb (118.8 kg)  01/27/20 262 lb (118.8 kg)   Hyperlipidemia: No current statin.  Borderline readings.  Improved last visit.  The 10-year ASCVD risk score Mikey Bussing DC Brooke Bonito., et al., 2013) is: 11.2%   Values used to calculate the score:     Age: 51 years     Sex: Male     Is Non-Hispanic African American: No     Diabetic: No     Tobacco smoker: No     Systolic Blood Pressure: 315 mmHg     Is BP treated: Yes     HDL Cholesterol: 43 mg/dL     Total Cholesterol: 160 mg/dL  Exercising. Lab Results  Component Value Date   CHOL 160 01/27/2020   HDL 43 01/27/2020   LDLCALC 100 (H) 01/27/2020   TRIG 92 01/27/2020   CHOLHDL 3.7 01/27/2020   Lab Results  Component Value Date   ALT 23 01/27/2020   AST 19 01/27/2020   ALKPHOS 48 01/27/2020   BILITOT 0.5 01/27/2020   Cancer screening Colonoscopy October 2016 with repeat scheduled in 10 years Prostate, history of prostate cancer with prostatectomy 2008.  Urologist Dr. Alinda Money.  PSAs have been undetectable. Lab Results  Component Value Date   PSA1  <0.1 05/22/2018   PSA <0.01 07/17/2015   Immunization History  Administered Date(s) Administered  . Influenza,inj,Quad PF,6+ Mos 05/22/2015, 10/30/2017, 05/22/2018  . Influenza-Unspecified 07/03/2020  . PFIZER SARS-COV-2 Vaccination 11/29/2019, 12/27/2019  . Tdap 09/24/2011  Shingles: had one - due for second - will have at pharmacy.  Covid booster: 07/03/20.   Depression screen [facility] 2/9 01/27/2020 12/01/2018 05/22/2018 05/22/2018 10/30/2017  Decreased Interest 0 0 0 0 0  Down, Depressed, Hopeless 0 0 0 0 0  PHQ - 2 Score 0 0 0 0 0   No exam data present  appt with optho in January.   Dental: last week - ongoing care.   Exercise: Tennis 2-3 times per week, biking 20 miles per week.  Some catching sensation with the right knee recently - past 2 months.   Feels pop lower in knee. Stumbled hunting in September. No swelling. Landed on rock, sore for few days. No swelling. Prior left knee TKR, not right. Not debilitating, but sore at times.  Tx: occasional ibuprofen - few times per week, does ok. No problem with tennis past 2 days.        History Patient Active Problem List   Diagnosis Date Noted  . Tibialis posterior tendinitis, right 04/07/2018  .  Leg length discrepancy 04/07/2018  . OA (osteoarthritis) of knee 01/13/2017  . Prostate cancer ([place]) 11/21/2012   Past Medical History:  Diagnosis Date  . Allergy   . Arthritis   . Cancer ([place])    Phreesia 07/30/2020  . GERD (gastroesophageal reflux disease)    mild no meds  . Hypertension   . Prostate cancer [facility])    Past Surgical History:  Procedure Laterality Date  . HAND TENDON SURGERY     Right  arm  . JOINT REPLACEMENT N/A    Phreesia 07/30/2020  . PROSTATE SURGERY N/A    Phreesia 07/30/2020  . PROSTATECTOMY    . ROTATOR CUFF REPAIR Bilateral left 2007 and right 09/2009  . TOTAL KNEE ARTHROPLASTY Left 01/13/2017   Procedure: LEFT TOTAL KNEE ARTHROPLASTY;  Surgeon: Gaynelle Arabian, MD;  Location: [facility];  Service:  Orthopedics;  Laterality: Left;   Allergies  Allergen Reactions  . Other     Catgut suture did not dissolve--causes infection (2007)   Prior to Admission medications   Medication Sig Start Date End Date Taking? Authorizing Provider  cetirizine (ZYRTEC) 10 MG tablet Take 10 mg by mouth daily as needed for allergies (for seasonal allergies (pt. takes in Spring & Fall)).    Yes [provider]  hydrochlorothiazide (MICROZIDE) 12.5 MG capsule Take 1 capsule (12.5 mg total) by mouth daily. 01/27/20  Yes Wendie Agreste, MD   Social History   Socioeconomic History  . Marital status: Married    Spouse name: Not on file  . Number of children: Not on file  . Years of education: Not on file  . Highest education level: Not on file  Occupational History  . Not on file  Tobacco Use  . Smoking status: Never Smoker  . Smokeless tobacco: Never Used  Substance and Sexual Activity  . Alcohol use: Yes    Alcohol/week: 6.0 - 7.0 standard drinks    Types: 4 Standard drinks or equivalent, 2 - 3 Glasses of wine per week    Comment: x a week  . Drug use: No  . Sexual activity: Yes  Other Topics Concern  . Not on file  Social History Narrative  . Not on file   Social Determinants of Health   Financial Resource Strain:   . Difficulty of Paying Living Expenses: Not on file  Food Insecurity:   . Worried About Charity fundraiser in the Last Year: Not on file  . Ran Out of Food in the Last Year: Not on file  Transportation Needs:   . Lack of Transportation (Medical): Not on file  . Lack of Transportation (Non-Medical): Not on file  Physical Activity:   . Days of Exercise per Week: Not on file  . Minutes of Exercise per Session: Not on file  Stress:   . Feeling of Stress : Not on file  Social Connections:   . Frequency of Communication with Friends and Family: Not on file  . Frequency of Social Gatherings with Friends and Family: Not on file  . Attends Religious Services: Not on  file  . Active Member of Clubs or Organizations: Not on file  . Attends Archivist Meetings: Not on file  . Marital Status: Not on file  Intimate Partner Violence:   . Fear of Current or Ex-Partner: Not on file  . Emotionally Abused: Not on file  . Physically Abused: Not on file  . Sexually Abused: Not on file    Review of  Systems   Objective:   Vitals:   08/02/20 0805 08/02/20 0808  BP: (!) 146/87 124/86  Pulse: 68   Temp: (!) 97.3 F (36.3 C)   TempSrc: Temporal   SpO2: 94%   Weight: 260 lb (117.9 kg)   Height: 6\' 2"  (1.88 m)      Physical Exam Vitals reviewed.  Constitutional:      Appearance: He is well-developed.  HENT:     Head: Normocephalic and atraumatic.  Eyes:     Pupils: Pupils are equal, round, and reactive to light.  Neck:     Vascular: No carotid bruit or JVD.  Cardiovascular:     Rate and Rhythm: Normal rate and regular rhythm.     Heart sounds: Normal heart sounds. No murmur heard.   Pulmonary:     Effort: Pulmonary effort is normal.     Breath sounds: Normal breath sounds. No rales.  Musculoskeletal:     Comments: Right knee full range of motion.  Skin intact without erythema or wound.  Trace effusion.  Minimal tenderness at the anterior aspect of the lateral joint line, no other bony tenderness.  Negative varus/valgus/Lachman/McMurray without apparent clunk/clicking.  Patella/patellar tendon nontender.  Skin:    General: Skin is warm and dry.  Neurological:     Mental Status: He is alert and oriented to person, place, and time.  Psychiatric:        Mood and Affect: Mood normal.        Behavior: Behavior normal.       Assessment & Plan:  Brian Higgins is a 64 y.o. male . Annual physical exam  - -anticipatory guidance as below in AVS, screening labs above. Health maintenance items as above in HPI discussed/recommended as applicable.   Essential hypertension - Plan: Lipid panel, Comprehensive metabolic panel  -Stable on  recheck, continue same regimen  Prediabetes - Plan: Hemoglobin A1c  -Most recent testing okay.  Repeat A1c.  Continue exercise  Hyperlipidemia, unspecified hyperlipidemia type - Plan: Lipid panel, Comprehensive metabolic panel  -Borderline LDL, ASCVD risk score.  No current statin.  Repeat labs, continue exercise  History of prostate cancer - Plan: PSA  -Remote prostatectomy, undetectable PSAs since, most recently in 2019, upgraded PSA ordered  Acute pain of right knee - Plan: DG Knee Complete 4 Views Right, meloxicam (MOBIC) 7.5 MG tablet  -X-ray ordered to rule out avulsion/fracture but unlikely.  Differential includes lateral meniscus tear/degenerative meniscal tear.  Trace effusion, and only intermittent symptoms.  Functionally intact at this time.  Decided to try meloxicam for a few weeks, then follow-up with myself or other sports medicine provider if not improving to consider injection versus advanced imaging.  RTC precautions if worse  Meds ordered this encounter  Medications  . meloxicam (MOBIC) 7.5 MG tablet    Sig: Take 1 tablet (7.5 mg total) by mouth daily.    Dispense:  30 tablet    Refill:  0   Patient Instructions    Knee pain could certainly be due to meniscus issue, but with infrequent symptoms can try meloxicam once per day for the next few weeks.  Do not take ibuprofen or other NSAIDs while taking this medication.  If not improving can follow-up with me or previous sports medicine providers to consider either different imaging or possible injection.  Let me know if there are questions.  I will check some blood work, no medication changes otherwise.  Thanks for coming in today.  Keeping you healthy  Get these tests  Blood pressure- Have your blood pressure checked once a year by your healthcare provider.  Normal blood pressure is 120/80  Weight- Have your body mass index (BMI) calculated to screen for obesity.  BMI is a measure of body fat based on height and  weight. You can also calculate your own BMI at [email or web address].  Cholesterol- Have your cholesterol checked every year.  Diabetes- Have your blood sugar checked regularly if you have high blood pressure, high cholesterol, have a family history of diabetes or if you are overweight.  Screening for Colon Cancer- Colonoscopy starting at age 64.  Screening may begin sooner depending on your family history and other health conditions. Follow up colonoscopy as directed by your Gastroenterologist.  Screening for Prostate Cancer- Both blood work (PSA) and a rectal exam help screen for Prostate Cancer.  Screening begins at age 93 with African-American men and at age 77 with Caucasian men.  Screening may begin sooner depending on your family history.   Take these medicines  Flu shot- Every fall.  Tetanus- Every 10 years  Shingles vaccine, second shot through pharmacy.  Pneumonia shot- Once after the age of 12; if you are younger than 39, ask your healthcare provider if you need a Pneumonia shot.  Take these steps  Don't smoke- If you do smoke, talk to your doctor about quitting.  For tips on how to quit, go to www.smokefree.gov or call 1-800-QUIT-NOW.  Be physically active- Exercise 5 days a week for at least 30 minutes.  If you are not already physically active start slow and gradually work up to 30 minutes of moderate physical activity.  Examples of moderate activity include walking briskly, mowing the yard, dancing, swimming, bicycling, etc.  Eat a healthy diet- Eat a variety of healthy food such as fruits, vegetables, low fat milk, low fat cheese, yogurt, lean meant, poultry, fish, beans, tofu, etc. For more information go to www.thenutritionsource.org  Drink alcohol in moderation- Limit alcohol intake to less than two drinks a day. Never drink and drive.  Dentist- Brush and floss twice daily; visit your dentist twice a year.  Depression- Your emotional health is as important as  your physical health. If you're feeling down, or losing interest in things you would normally enjoy please talk to your healthcare provider.  Eye exam- Visit your eye doctor every year.  Safe sex- If you may be exposed to a sexually transmitted infection, use a condom.  Seat belts- Seat belts can save your life; always wear one.  Smoke/Carbon Monoxide detectors- These detectors need to be installed on the appropriate level of your home.  Replace batteries at least once a year.  Skin cancer- When out in the sun, cover up and use sunscreen 15 SPF or higher.  Violence- If anyone is threatening you, please tell your healthcare provider.  Living Will/ Health care power of attorney- Speak with your healthcare provider and family.   If you have lab work done today you will be contacted with your lab results within the next 2 weeks.  If you have not heard from [place] then please contact us. The fastest way to get your results is to register for My Chart.   IF you received an x-ray today, you will receive an invoice from [facility]. Please contact [facility] at [phone] with questions or concerns regarding your invoice.   IF you received labwork today, you will receive an invoice from [place]. Please contact [facility] at [phone] with questions  or concerns regarding your invoice.   Our billing staff will not be able to assist you with questions regarding bills from these companies.  You will be contacted with the lab results as soon as they are available. The fastest way to get your results is to activate your My Chart account. Instructions are located on the last page of this paperwork. If you have not heard from [place] regarding the results in 2 weeks, please contact this office.         Signed, [name], MD [facility]

## 2020-08-02 NOTE — Patient Instructions (Addendum)
Knee pain could certainly be due to meniscus issue, but with infrequent symptoms can try meloxicam once per day for the next few weeks.  Do not take ibuprofen or other NSAIDs while taking this medication.  If not improving can follow-up with me or previous sports medicine providers to consider either different imaging or possible injection.  Let me know if there are questions.  I will check some blood work, no medication changes otherwise.  Thanks for coming in today.  Keeping you healthy  Get these tests  Blood pressure- Have your blood pressure checked once a year by your healthcare provider.  Normal blood pressure is 120/80  Weight- Have your body mass index (BMI) calculated to screen for obesity.  BMI is a measure of body fat based on height and weight. You can also calculate your own BMI at [email or web address].  Cholesterol- Have your cholesterol checked every year.  Diabetes- Have your blood sugar checked regularly if you have high blood pressure, high cholesterol, have a family history of diabetes or if you are overweight.  Screening for Colon Cancer- Colonoscopy starting at age 22.  Screening may begin sooner depending on your family history and other health conditions. Follow up colonoscopy as directed by your Gastroenterologist.  Screening for Prostate Cancer- Both blood work (PSA) and a rectal exam help screen for Prostate Cancer.  Screening begins at age 75 with African-American men and at age 55 with Caucasian men.  Screening may begin sooner depending on your family history.   Take these medicines  Flu shot- Every fall.  Tetanus- Every 10 years  Shingles vaccine, second shot through pharmacy.  Pneumonia shot- Once after the age of 31; if you are younger than 12, ask your healthcare provider if you need a Pneumonia shot.  Take these steps  Don't smoke- If you do smoke, talk to your doctor about quitting.  For tips on how to quit, go to www.smokefree.gov or call  1-800-QUIT-NOW.  Be physically active- Exercise 5 days a week for at least 30 minutes.  If you are not already physically active start slow and gradually work up to 30 minutes of moderate physical activity.  Examples of moderate activity include walking briskly, mowing the yard, dancing, swimming, bicycling, etc.  Eat a healthy diet- Eat a variety of healthy food such as fruits, vegetables, low fat milk, low fat cheese, yogurt, lean meant, poultry, fish, beans, tofu, etc. For more information go to www.thenutritionsource.org  Drink alcohol in moderation- Limit alcohol intake to less than two drinks a day. Never drink and drive.  Dentist- Brush and floss twice daily; visit your dentist twice a year.  Depression- Your emotional health is as important as your physical health. If you're feeling down, or losing interest in things you would normally enjoy please talk to your healthcare provider.  Eye exam- Visit your eye doctor every year.  Safe sex- If you may be exposed to a sexually transmitted infection, use a condom.  Seat belts- Seat belts can save your life; always wear one.  Smoke/Carbon Monoxide detectors- These detectors need to be installed on the appropriate level of your home.  Replace batteries at least once a year.  Skin cancer- When out in the sun, cover up and use sunscreen 15 SPF or higher.  Violence- If anyone is threatening you, please tell your healthcare provider.  Living Will/ Health care power of attorney- Speak with your healthcare provider and family.   If you have lab work done today you will  be contacted with your lab results within the next 2 weeks.  If you have not heard from [place] then please contact us. The fastest way to get your results is to register for My Chart.   IF you received an x-ray today, you will receive an invoice from [facility]. Please contact [facility] at [phone] with questions or concerns regarding your invoice.   IF  you received labwork today, you will receive an invoice from [place]. Please contact [facility] at [phone] with questions or concerns regarding your invoice.   Our billing staff will not be able to assist you with questions regarding bills from these companies.  You will be contacted with the lab results as soon as they are available. The fastest way to get your results is to activate your My Chart account. Instructions are located on the last page of this paperwork. If you have not heard from [place] regarding the results in 2 weeks, please contact this office.

## 2020-08-03 LAB — LIPID PANEL
Chol/HDL Ratio: 4.2 ratio (ref 0.0–5.0)
Cholesterol, Total: 184 mg/dL (ref 100–199)
HDL: 44 mg/dL (ref >39)
LDL Chol Calc (NIH): 125 mg/dL — ABNORMAL HIGH (ref 0–99)
Triglycerides: 80 mg/dL (ref 0–149)
VLDL Cholesterol Cal: 15 mg/dL (ref 5–40)

## 2020-08-03 LAB — COMPREHENSIVE METABOLIC PANEL
ALT: 25 IU/L (ref 0–44)
AST: 16 IU/L (ref 0–40)
Albumin/Globulin Ratio: 2.1 (ref 1.2–2.2)
Albumin: 4.7 g/dL (ref 3.8–4.8)
Alkaline Phosphatase: 53 IU/L (ref 44–121)
BUN/Creatinine Ratio: 19 (ref 10–24)
BUN: 18 mg/dL (ref 8–27)
Bilirubin Total: 0.8 mg/dL (ref 0.0–1.2)
CO2: 22 mmol/L (ref 20–29)
Calcium: 9.8 mg/dL (ref 8.6–10.2)
Chloride: 103 mmol/L (ref 96–106)
Creatinine, Ser: 0.96 mg/dL (ref 0.76–1.27)
GFR calc Af Amer: 97 mL/min/{1.73_m2} (ref >59)
GFR calc non Af Amer: 84 mL/min/{1.73_m2} (ref >59)
Globulin, Total: 2.2 g/dL (ref 1.5–4.5)
Glucose: 96 mg/dL (ref 65–99)
Potassium: 4.1 mmol/L (ref 3.5–5.2)
Sodium: 140 mmol/L (ref 134–144)
Total Protein: 6.9 g/dL (ref 6.0–8.5)

## 2020-08-03 LAB — PSA: Prostate Specific Ag, Serum: <0.1 ng/mL (ref 0.0–4.0)

## 2020-08-03 LAB — HEMOGLOBIN A1C
Est. average glucose Bld gHb Est-mCnc: 123 mg/dL
Hgb A1c MFr Bld: 5.9 % — ABNORMAL HIGH (ref 4.8–5.6)

## 2020-08-07 NOTE — Progress Notes (Signed)
I, Peterson Lombard, LAT, ATC acting as a scribe for Lynne Leader, MD.  Subjective:    I'm seeing this patient as a consultation for Dr. [name]. Note will be routed back to referring provider/PCP.  CC: R knee pn  HPI: Pt was last seen by PCP on 08/02/20 for annual physical and reported R knee had been bothering him for the past 2 months.  MOI: Pt stated that he stumbled hunting in September and landed on a rock. Pt c/o catching sensation and feels it popping. Today, pt report R knee is contuing to bother him, especially when going down stairs, knee is catches. Pn along lateral joint line.  Rx tried: occasional ibuprofen   Patient is going skiing on December 16.  Dx imaging: 08/02/20 R knee   Past medical history, Surgical history, Family history, Social history, Allergies, and medications have been entered into the medical record, reviewed.   Review of Systems: No new headache, visual changes, nausea, vomiting, diarrhea, constipation, dizziness, abdominal pain, skin rash, fevers, chills, night sweats, weight loss, swollen lymph nodes, body aches, joint swelling, muscle aches, chest pain, shortness of breath, mood changes, visual or auditory hallucinations.   Objective:    Vitals:   08/08/20 0806  BP: 138/78  Pulse: (!) 58  SpO2: 95%   General: Well Developed, well nourished, and in no acute distress.  Neuro/Psych: Alert and oriented x3, extra-ocular muscles intact, able to move all 4 extremities, sensation grossly intact. Skin: Warm and dry, no rashes noted.  Respiratory: Not using accessory muscles, speaking in full sentences, trachea midline.  Cardiovascular: Pulses palpable, no extremity edema. Abdomen: Does not appear distended. MSK: Right knee minimal effusion normal-appearing otherwise normal motion with crepitation. Tender palpation lateral joint line. Stable ligamentous exam. Mildly positive lateral McMurray test. Intact strength.  Lab and Radiology  Results  Procedure: Real-time Ultrasound Guided Injection of right knee superior lateral patellar space Device: Philips Affiniti 50G Images permanently stored and available for review in PACS Ultrasound examination of knee prior to injection reveals partial extruded lateral meniscus. Verbal informed consent obtained.  Discussed risks and benefits of procedure. Warned about infection bleeding damage to structures skin hypopigmentation and fat atrophy among others. Patient expresses understanding and agreement Time-out conducted.   Noted no overlying erythema, induration, or other signs of local infection.   Skin prepped in a sterile fashion.   Local anesthesia: Topical Ethyl chloride.   With sterile technique and under real time ultrasound guidance:  40 mg of Kenalog and 2 mL of Marcaine injected into knee. Fluid seen entering the joint capsule.   Completed without difficulty   Pain immediately resolved suggesting accurate placement of the medication.   Advised to call if fevers/chills, erythema, induration, drainage, or persistent bleeding.   Images permanently stored and available for review in the ultrasound unit.  Impression: Technically successful ultrasound guided injection.    DG Knee Complete 4 Views Right  Result Date: 08/02/2020 CLINICAL DATA:  Pain following fall EXAM: RIGHT KNEE - COMPLETE 4+ VIEW COMPARISON:  None. FINDINGS: Frontal, lateral, tunnel, and sunrise patellar images were obtained. There is no fracture or dislocation. No joint effusion. There is slight narrowing of the patellofemoral joint. Other joint spaces appear unremarkable. No erosive change. IMPRESSION: No fracture, dislocation, or joint effusion. Slight narrowing patellofemoral joint. Other joint spaces appear unremarkable. Electronically Signed   By: Lowella Grip III M.D.   On: 08/02/2020 09:04   I, Lynne Leader, personally (independently) visualized and performed the interpretation  of the images attached  in this note.     Impression and Recommendations:    Assessment and Plan: 64 y.o. male with knee pain mostly located at lateral joint line.  Likely exacerbation or new degenerative lateral meniscus tear.  Discussed options.  Plan for maximizing conservative management with Voltaren gel, compressive knee sleeve, and injection.  We will go ahead and schedule a follow-up appointment just prior to his trip to [place] to go skiing.  Could proceed with more treatment at that point if needed.Marland Kitchen  PDMP not reviewed this encounter. Orders Placed This Encounter  Procedures  . [place] LIMITED JOINT SPACE STRUCTURES LOW RIGHT(NO LINKED CHARGES)    Standing Status:   Future    Number of Occurrences:   1    Standing Expiration Date:   02/05/2021    Order Specific Question:   Reason for Exam (SYMPTOM  OR DIAGNOSIS REQUIRED)    Answer:   Chronic right knee pain    Order Specific Question:   Preferred imaging location?    Answer:   [place]   No orders of the defined types were placed in this encounter.   Discussed warning signs or symptoms. Please see discharge instructions. Patient expresses understanding.   The above documentation has been reviewed and is accurate and complete Lynne Leader, M.D.

## 2020-08-08 ENCOUNTER — Ambulatory Visit: Payer: Self-pay

## 2020-08-08 ENCOUNTER — Other Ambulatory Visit: Payer: Self-pay

## 2020-08-08 ENCOUNTER — Ambulatory Visit: Payer: Managed Care, Other (non HMO) | Admitting: Family Medicine

## 2020-08-08 ENCOUNTER — Encounter: Payer: Self-pay | Admitting: Family Medicine

## 2020-08-08 VITALS — BP 138/78 | HR 58 | Ht 74.0 in | Wt 263.0 lb

## 2020-08-08 DIAGNOSIS — M25561 Pain in right knee: Secondary | ICD-10-CM

## 2020-08-08 DIAGNOSIS — G8929 Other chronic pain: Secondary | ICD-10-CM

## 2020-08-08 NOTE — Patient Instructions (Addendum)
Thank you for coming in today.  Please use voltaren gel up to 4x daily for pain as needed.   I recommend you obtained a compression sleeve to help with your joint problems. There are many options on the market however I recommend obtaining a full knee Body Helix compression sleeve.  You can find information (including how to appropriate measure yourself for sizing) can be found at www.Body http://www.lambert.com/.  Many of these products are health savings account (HSA) eligible.   You can use the compression sleeve at any time throughout the day but is most important to use while being active as well as for 2 hours post-activity.   It is appropriate to ice following activity with the compression sleeve in place.  Let me know if you are not better prior to ski trip.    Ok to schedule with me right before the 10th of December.    Meniscus Tear  A meniscus tear is a knee injury that happens when a piece of the meniscus is torn. The meniscus is a thick, rubbery, wedge-shaped cartilage in the knee. Two menisci are located in each knee. They sit between the upper bone (femur) and lower bone (tibia) that make up the knee joint. Each meniscus acts as a shock absorber for the knee. A torn meniscus is one of the most common types of knee injuries. This injury can range from mild to severe. Surgery may be needed to repair a severe tear. What are the causes? This condition may be caused by any kneeling, squatting, twisting, or pivoting movement. Sports-related injuries are the most common cause. These often occur from:  Running and stopping suddenly. ? Changing direction. ? Being tackled or knocked off your feet.  Lifting or carrying heavy weights. As people get older, their menisci get thinner and weaker. In these people, tears can happen more easily, such as from climbing stairs. What increases the risk? You are more likely to develop this condition if you:  Play contact sports.  Have a job that requires  kneeling or squatting.  Are male.  Are over 38 years old. What are the signs or symptoms? Symptoms of this condition include:  Knee pain, especially at the side of the knee joint. You may feel pain when the injury occurs, or you may only hear a pop and feel pain later.  A feeling that your knee is clicking, catching, locking, or giving way.  Not being able to fully bend or extend your knee.  Bruising or swelling in your knee. How is this diagnosed? This condition may be diagnosed based on your symptoms and a physical exam. You may also have tests, such as:  X-rays.  MRI.  A procedure to look inside your knee with a narrow surgical telescope (arthroscopy). You may be referred to a knee specialist (orthopedic surgeon). How is this treated? Treatment for this injury depends on the severity of the tear. Treatment for a mild tear may include:  Rest.  Medicine to reduce pain and swelling. This is usually a nonsteroidal anti-inflammatory drug (NSAID), like ibuprofen.  A knee brace, sleeve, or wrap.  Using crutches or a walker to keep weight off your knee and to help you walk.  Exercises to strengthen your knee (physical therapy). You may need surgery if you have a severe tear or if other treatments are not working. Follow these instructions at home: If you have a brace, sleeve, or wrap:  Wear it as told by your health care provider. Remove  it only as told by your health care provider.  Loosen the brace, sleeve, or wrap if your toes tingle, become numb, or turn cold and blue.  Keep the brace, sleeve, or wrap clean and dry.  If the brace, sleeve, or wrap is not waterproof: ? Do not let it get wet. ? Cover it with a watertight covering when you take a bath or shower. Managing pain and swelling   Take over-the-counter and prescription medicines only as told by your health care provider.  If directed, put ice on your knee: ? If you have a removable brace, sleeve, or wrap,  remove it as told by your health care provider. ? Put ice in a plastic bag. ? Place a towel between your skin and the bag. ? Leave the ice on for 20 minutes, 2-3 times per day.  Move your toes often to avoid stiffness and to lessen swelling.  Raise (elevate) the injured area above the level of your heart while you are sitting or lying down. Activity  Do not use the injured limb to support your body weight until your health care provider says that you can. Use crutches or a walker as told by your health care provider.  Return to your normal activities as told by your health care provider. Ask your health care provider what activities are safe for you.  Perform range-of-motion exercises only as told by your health care provider.  Begin doing exercises to strengthen your knee and leg muscles only as told by your health care provider. After you recover, your health care provider may recommend these exercises to help prevent another injury. General instructions  Use a knee brace, sleeve, or wrap as told by your health care provider.  Ask your health care provider when it is safe to drive if you have a brace, sleeve, or wrap on your knee.  Do not use any products that contain nicotine or tobacco, such as cigarettes, e-cigarettes, and chewing tobacco. If you need help quitting, ask your health care provider.  Ask your health care provider if the medicine prescribed to you: ? Requires you to avoid driving or using heavy machinery. ? Can cause constipation. You may need to take these actions to prevent or treat constipation:  Drink enough fluid to keep your urine pale yellow.  Take over-the-counter or prescription medicines.  Eat foods that are high in fiber, such as beans, whole grains, and fresh fruits and vegetables.  Limit foods that are high in fat and processed sugars, such as fried or sweet foods.  Keep all follow-up visits as told by your health care provider. This is  important. Contact a health care provider if:  You have a fever.  Your knee becomes red, tender, or swollen.  Your pain medicine is not helping.  Your symptoms get worse or do not improve after 2 weeks of home care. Summary  A meniscus tear is a knee injury that happens when a piece of the meniscus is torn.  Treatment for this injury depends on the severity of the tear. You may need surgery if you have a severe tear or if other treatments are not working.  Rest, ice, and raise (elevate) your injured knee as told by your health care provider. This will help lessen pain and swelling.  Contact a health care provider if you have new symptoms, or your symptoms get worse or do not improve after 2 weeks of home care.  Keep all follow-up visits as told by your  health care provider. This is important. This information is not intended to replace advice given to you by your health care provider. Make sure you discuss any questions you have with your health care provider. Document Revised: 03/24/2018 Document Reviewed: 03/24/2018 Elsevier Patient Education  Brian Higgins.

## 2020-08-30 NOTE — Progress Notes (Signed)
I, [name], LAT, ATC, am serving as scribe for Dr. Lynne Leader.  Brian Higgins is a 64 y.o. male who presents to [place] at [facility] today for f/u of R knee pain and mechanical knee symptoms.  He was last seen by Dr. Georgina Snell on 08/08/20 and had a R knee injection. He was advised to purchase a knee compression sleeve and to use Voltaren gel.  Since his last visit, pt reports .  He is also getting ready to leave town for ski trip on 09/07/20. Today, pt reports knee is doing OK and feels like the the previous steroid injection helped. Pt is very active and has been going to the gym everyday since his last visit and playing tennis a few days a week. Pt locates pain anterior and lateral aspect and c/o "catching."  Diagnostic testing: R knee XR- 08/02/20  Pertinent review of systems: No fevers or chills  Relevant historical information: Knee osteoarthritis.  Prostate cancer history.   Exam:  BP (!) 146/98 (BP Location: Right Arm, Patient Position: Sitting, Cuff Size: Normal)   Pulse 60   Ht 6\' 2"  (1.88 m)   Wt 261 lb 9.6 oz (118.7 kg)   SpO2 98%   BMI 33.59 kg/m  General: Well Developed, well nourished, and in no acute distress.   MSK: Right knee mild effusion normal motion.  Tender palpation lateral joint line.    Lab and Radiology Results  Procedure: Real-time Ultrasound Guided Injection of right knee superior lateral patellar space Device: Philips Affiniti 50G Images permanently stored and available for review in PACS Verbal informed consent obtained.  Discussed risks and benefits of procedure. Warned about infection bleeding damage to structures skin hypopigmentation and fat atrophy among others. Patient expresses understanding and agreement Time-out conducted.   Noted no overlying erythema, induration, or other signs of local infection.   Skin prepped in a sterile fashion.   Local anesthesia: Topical Ethyl chloride.   With sterile technique and under  real time ultrasound guidance:  40 mg of Kenalog and 2 mL of Marcaine injected into joint. Fluid seen entering the joint capsule.   Completed without difficulty   Pain immediately resolved suggesting accurate placement of the medication.   Advised to call if fevers/chills, erythema, induration, drainage, or persistent bleeding.   Images permanently stored and available for review in the ultrasound unit.  Impression: Technically successful ultrasound guided injection.         Assessment and Plan: 64 y.o. male with right knee pain recurrent due to DJD.  Plan for repeat injection today.  Injection is earlier than would be typical for steroid injection.  However patient is planning a rare ski trip and would like his knee not to be painful during the trip.  I think it is reasonable for very occasional repeat steroid injection earlier than would be typical.  Patient will likely require total knee replacement in the near future.  Check back as needed.   PDMP not reviewed this encounter. Orders Placed This Encounter  Procedures  . [place] LIMITED JOINT SPACE STRUCTURES LOW RIGHT(NO LINKED CHARGES)    Standing Status:   Future    Number of Occurrences:   1    Standing Expiration Date:   03/01/2021    Order Specific Question:   Reason for Exam (SYMPTOM  OR DIAGNOSIS REQUIRED)    Answer:   chronic right knee pain    Order Specific Question:   Preferred imaging location?    Answer:  [place]   No orders of the defined types were placed in this encounter.    Discussed warning signs or symptoms. Please see discharge instructions. Patient expresses understanding.   The above documentation has been reviewed and is accurate and complete Lynne Leader, M.D.

## 2020-08-31 ENCOUNTER — Ambulatory Visit: Payer: Managed Care, Other (non HMO) | Admitting: Family Medicine

## 2020-08-31 ENCOUNTER — Other Ambulatory Visit: Payer: Self-pay

## 2020-08-31 ENCOUNTER — Ambulatory Visit: Payer: Self-pay

## 2020-08-31 VITALS — BP 146/98 | HR 60 | Ht 74.0 in | Wt 261.6 lb

## 2020-08-31 DIAGNOSIS — G8929 Other chronic pain: Secondary | ICD-10-CM

## 2020-08-31 DIAGNOSIS — M25561 Pain in right knee: Secondary | ICD-10-CM

## 2020-08-31 NOTE — Patient Instructions (Signed)
Thank you for coming in today.  Call or go to the ER if you develop a large red swollen joint with extreme pain or oozing puss.   Enjoy the Ski trip.

## 2020-11-06 ENCOUNTER — Telehealth: Payer: Self-pay | Admitting: Cardiovascular Disease

## 2020-11-06 NOTE — Telephone Encounter (Signed)
    Brian Higgins was exposed to his daughter in law who was found to have covid the following day . He has no symptoms but want to start the [facility] protocol for prevention of covid.  Based on the information from [facility] We will start him on Ivermectin 0.2 mg / kg twice a week for 2-3 weeks.    Wt is 250 lbs which is 113 kg Will send in a prescription for Ivermectin 3 mg tabs He is to take 8 tabs on Mondays and Thursdays with a meal.  [facility] [address].     Mertie Moores, MD  11/06/2020 12:59 PM    [place],  [place], [place]  [address] Phone: [phone]; Fax: [phone]

## 2021-01-31 ENCOUNTER — Ambulatory Visit: Payer: Self-pay | Admitting: Family Medicine

## 2021-02-05 ENCOUNTER — Encounter: Payer: Self-pay | Admitting: Family Medicine

## 2021-02-05 ENCOUNTER — Other Ambulatory Visit: Payer: Self-pay

## 2021-02-05 ENCOUNTER — Ambulatory Visit: Payer: Managed Care, Other (non HMO) | Admitting: Family Medicine

## 2021-02-05 VITALS — BP 128/76 | HR 68 | Temp 98.3°F | Resp 16 | Ht 74.0 in | Wt 261.4 lb

## 2021-02-05 DIAGNOSIS — Z23 Encounter for immunization: Secondary | ICD-10-CM | POA: Diagnosis not present

## 2021-02-05 DIAGNOSIS — I10 Essential (primary) hypertension: Secondary | ICD-10-CM

## 2021-02-05 DIAGNOSIS — E785 Hyperlipidemia, unspecified: Secondary | ICD-10-CM

## 2021-02-05 DIAGNOSIS — R7303 Prediabetes: Secondary | ICD-10-CM | POA: Diagnosis not present

## 2021-02-05 LAB — COMPREHENSIVE METABOLIC PANEL
ALT: 27 U/L (ref 0–53)
AST: 23 U/L (ref 0–37)
Albumin: 4.8 g/dL (ref 3.5–5.2)
Alkaline Phosphatase: 50 U/L (ref 39–117)
BUN: 26 mg/dL — ABNORMAL HIGH (ref 6–23)
CO2: 24 mEq/L (ref 19–32)
Calcium: 9.4 mg/dL (ref 8.4–10.5)
Chloride: 102 mEq/L (ref 96–112)
Creatinine, Ser: 0.85 mg/dL (ref 0.40–1.50)
GFR: 91.89 mL/min (ref >60.00)
Glucose, Bld: 94 mg/dL (ref 70–99)
Potassium: 3.9 mEq/L (ref 3.5–5.1)
Sodium: 138 mEq/L (ref 135–145)
Total Bilirubin: 0.9 mg/dL (ref 0.2–1.2)
Total Protein: 7 g/dL (ref 6.0–8.3)

## 2021-02-05 LAB — LIPID PANEL
Cholesterol: 189 mg/dL (ref 0–200)
HDL: 43 mg/dL (ref >39.00)
LDL Cholesterol: 112 mg/dL — ABNORMAL HIGH (ref 0–99)
NonHDL: 145.96
Total CHOL/HDL Ratio: 4
Triglycerides: 172 mg/dL — ABNORMAL HIGH (ref 0.0–149.0)
VLDL: 34.4 mg/dL (ref 0.0–40.0)

## 2021-02-05 LAB — HEMOGLOBIN A1C: Hgb A1c MFr Bld: 5.9 % (ref 4.6–6.5)

## 2021-02-05 MED ORDER — HYDROCHLOROTHIAZIDE 12.5 MG PO CAPS: 12.5000 mg | ORAL_CAPSULE | Freq: Every day | ORAL | 3 refills | Status: DC

## 2021-02-05 NOTE — Progress Notes (Signed)
Subjective:  Patient ID: Brian Higgins, male    DOB: [birth year]  Age: 65 y.o. MRN: [id number]  CC:  Chief Complaint  Patient presents with  . Hypertension    Pt here for 6 month recheck HTN denies physical symptoms feels well, has not taken BP at home.   . Immunizations    Pt had first dose shingrix 07/2019 and did not have second dose pt was unsure if needed to get second dose still or start over.     HPI Brian Higgins presents for   Hypertension: Taking HCTZ 12.5mg  qd.  No new side effects.  Home readings:130/80. Tennis for exercise. 3-4 times per week.   BP Readings from Last 3 Encounters:  02/05/21 128/76  08/31/20 (!) 146/98  08/08/20 138/78   Lab Results  Component Value Date   CREATININE 0.96 08/02/2020   Prediabetes: Exercise with tennis. No limiting injuries. R knee pain. No regular use of Mobic - min change. Has voltaren gel if needed.  No fast food No sweet tea/soda.  Low carbs. Alcohol: 3-4 per week.   Lab Results  Component Value Date   HGBA1C 5.9 (H) 08/02/2020   Wt Readings from Last 3 Encounters:  02/05/21 261 lb 6.4 oz (118.6 kg)  08/31/20 261 lb 9.6 oz (118.7 kg)  08/08/20 263 lb (119.3 kg)   Hyperlipidemia: No current statin. Mild elevated ascvd risk score in past.  The 10-year ASCVD risk score Brian Bussing DC Jr., et al., 2013) is: 13.9%   Values used to calculate the score:     Age: 52 years     Sex: Male     Is Non-Hispanic African American: No     Diabetic: No     Tobacco smoker: No     Systolic Blood Pressure: 093 mmHg     Is BP treated: Yes     HDL Cholesterol: 44 mg/dL     Total Cholesterol: 184 mg/dL  Lab Results  Component Value Date   CHOL 184 08/02/2020   HDL 44 08/02/2020   LDLCALC 125 (H) 08/02/2020   TRIG 80 08/02/2020   CHOLHDL 4.2 08/02/2020   Lab Results  Component Value Date   ALT 25 08/02/2020   AST 16 08/02/2020   ALKPHOS 53 08/02/2020   BILITOT 0.8 08/02/2020   HM: shingrix - had initial few years ago. 2nd  one today.   History Patient Active Problem List   Diagnosis Date Noted  . Tibialis posterior tendinitis, right 04/07/2018  . Leg length discrepancy 04/07/2018  . OA (osteoarthritis) of knee 01/13/2017  . Prostate cancer ([place]) 11/21/2012   Past Medical History:  Diagnosis Date  . Allergy   . Arthritis   . Cancer ([place])    Phreesia 07/30/2020  . GERD (gastroesophageal reflux disease)    mild no meds  . Hypertension   . Prostate cancer [facility])    Past Surgical History:  Procedure Laterality Date  . HAND TENDON SURGERY     Right  arm  . JOINT REPLACEMENT N/A    Phreesia 07/30/2020  . PROSTATE SURGERY N/A    Phreesia 07/30/2020  . PROSTATECTOMY    . ROTATOR CUFF REPAIR Bilateral left 2007 and right 09/2009  . TOTAL KNEE ARTHROPLASTY Left 01/13/2017   Procedure: LEFT TOTAL KNEE ARTHROPLASTY;  Surgeon: Gaynelle Arabian, MD;  Location: [facility];  Service: Orthopedics;  Laterality: Left;   Allergies  Allergen Reactions  . Other     Catgut suture did not dissolve--causes infection (2007)  Prior to Admission medications   Medication Sig Start Date End Date Taking? Authorizing Provider  cetirizine (ZYRTEC) 10 MG tablet Take 10 mg by mouth daily as needed for allergies (for seasonal allergies (pt. takes in Spring & Fall)).    Yes [provider]  hydrochlorothiazide (MICROZIDE) 12.5 MG capsule Take 1 capsule (12.5 mg total) by mouth daily. 01/27/20  Yes Wendie Agreste, MD   Social History   Socioeconomic History  . Marital status: Married    Spouse name: Not on file  . Number of children: Not on file  . Years of education: Not on file  . Highest education level: Not on file  Occupational History  . Not on file  Tobacco Use  . Smoking status: Never Smoker  . Smokeless tobacco: Never Used  Substance and Sexual Activity  . Alcohol use: Yes    Alcohol/week: 6.0 - 7.0 standard drinks    Types: 4 Standard drinks or equivalent, 2 - 3 Glasses of wine per week    Comment: x a  week  . Drug use: No  . Sexual activity: Yes  Other Topics Concern  . Not on file  Social History Narrative  . Not on file   Social Determinants of Health   Financial Resource Strain: Not on file  Food Insecurity: Not on file  Transportation Needs: Not on file  Physical Activity: Not on file  Stress: Not on file  Social Connections: Not on file  Intimate Partner Violence: Not on file    Review of Systems  Constitutional: Negative for fatigue and unexpected weight change.  Eyes: Negative for visual disturbance.  Respiratory: Negative for cough, chest tightness and shortness of breath.   Cardiovascular: Negative for chest pain, palpitations and leg swelling.  Gastrointestinal: Negative for abdominal pain and blood in stool.  Neurological: Negative for dizziness, light-headedness and headaches.     Objective:   Vitals:   02/05/21 1031  BP: 128/76  Pulse: 68  Resp: 16  Temp: 98.3 F (36.8 C)  TempSrc: Temporal  SpO2: 96%  Weight: 261 lb 6.4 oz (118.6 kg)  Height: 6\' 2"  (1.88 m)     Physical Exam Vitals reviewed.  Constitutional:      Appearance: He is well-developed.  HENT:     Head: Normocephalic and atraumatic.  Eyes:     Pupils: Pupils are equal, round, and reactive to light.  Neck:     Vascular: No carotid bruit or JVD.  Cardiovascular:     Rate and Rhythm: Normal rate and regular rhythm.     Heart sounds: Normal heart sounds. No murmur heard.   Pulmonary:     Effort: Pulmonary effort is normal.     Breath sounds: Normal breath sounds. No rales.  Skin:    General: Skin is warm and dry.  Neurological:     Mental Status: He is alert and oriented to person, place, and time.        Assessment & Plan:  Brian Higgins is a 65 y.o. male . Prediabetes - Plan: Hemoglobin A1c  -Commended on current exercise regimen, diet, check A1c.  Essential hypertension - Plan: hydrochlorothiazide (MICROZIDE) 12.5 MG capsule, Comprehensive metabolic  panel  -Stable, continue HCTZ same dose.  Check labs  Hyperlipidemia, unspecified hyperlipidemia type - Plan: Lipid panel  -Option of intermittent dosing of statin versus coronary calcium scoring.  Check lipids.  Need for shingles vaccine - Plan: Varicella-zoster vaccine IM    Meds ordered this encounter  Medications  .  hydrochlorothiazide (MICROZIDE) 12.5 MG capsule    Sig: Take 1 capsule (12.5 mg total) by mouth daily.    Dispense:  90 capsule    Refill:  3   Patient Instructions  No change in meds today. voltaren gel if needed for knee pain.  Depending on cholesterol level can discuss meds (intermittent dosing an option) or other testing like coronary calcium scoring.   Return to the clinic or go to the nearest emergency room if any of your symptoms worsen or new symptoms occur.      Signed, [name], MD [facility]

## 2021-02-05 NOTE — Patient Instructions (Addendum)
No change in meds today. voltaren gel if needed for knee pain.  Depending on cholesterol level can discuss meds (intermittent dosing an option) or other testing like coronary calcium scoring.   Return to the clinic or go to the nearest emergency room if any of your symptoms worsen or new symptoms occur.

## 2021-02-27 ENCOUNTER — Encounter: Payer: Self-pay | Admitting: Family Medicine

## 2021-02-27 DIAGNOSIS — E785 Hyperlipidemia, unspecified: Secondary | ICD-10-CM

## 2021-02-27 MED ORDER — ATORVASTATIN CALCIUM 10 MG PO TABS: 10.0000 mg | ORAL_TABLET | Freq: Every day | ORAL | 0 refills | Status: DC

## 2021-05-25 ENCOUNTER — Encounter: Payer: Self-pay | Admitting: Family Medicine

## 2021-05-25 ENCOUNTER — Ambulatory Visit: Payer: Managed Care, Other (non HMO) | Admitting: Family Medicine

## 2021-05-25 ENCOUNTER — Other Ambulatory Visit: Payer: Self-pay

## 2021-05-25 ENCOUNTER — Ambulatory Visit: Payer: Self-pay

## 2021-05-25 VITALS — BP 130/72 | HR 53 | Ht 74.0 in | Wt 263.4 lb

## 2021-05-25 DIAGNOSIS — G8929 Other chronic pain: Secondary | ICD-10-CM | POA: Diagnosis not present

## 2021-05-25 DIAGNOSIS — M25561 Pain in right knee: Secondary | ICD-10-CM

## 2021-05-25 NOTE — Progress Notes (Signed)
   I, [name], LAT, ATC, am serving as scribe for Dr. Lynne Leader.  Brian Higgins is a 65 y.o. male who presents to [place] at [facility] today for continued chronic R knee pain. Pt was last seen by Dr. Georgina Snell on 08/31/20 and was given a R knee steroid injection. Today, pt reports that his R knee pain flared up on Wed when he was playing tennis and felt like his R knee caught w/ mechanical symptoms that persisted afterward w/ walking.  He denies any swelling currently.  He locates his pain to his R ant-lat knee.  Dx imaging: 08/02/20 R knee XR  Pertinent review of systems: no fever or chills  Relevant historical information: hx prostate cancer.  History of left total knee replacement.   Exam:  BP 130/72 (BP Location: Right Arm, Patient Position: Sitting, Cuff Size: Large)   Pulse (!) 53   Ht '6\' 2"'$  (1.88 m)   Wt 263 lb 6.4 oz (119.5 kg)   SpO2 95%   BMI 33.82 kg/m  General: Well Developed, well nourished, and in no acute distress.   MSK: Right knee: Normal appearing.  Normal motion TTP lateral joint line. Stable ligament exam.    Lab and Radiology Results Procedure: Real-time Ultrasound Guided Injection of right knee superior lateral patellar space  Device: Philips Affiniti 50G Images permanently stored and available for review in PACS Verbal informed consent obtained.  Discussed risks and benefits of procedure. Warned about infection bleeding damage to structures skin hypopigmentation and fat atrophy among others. Patient expresses understanding and agreement Time-out conducted.   Noted no overlying erythema, induration, or other signs of local infection.   Skin prepped in a sterile fashion.   Local anesthesia: Topical Ethyl chloride.   With sterile technique and under real time ultrasound guidance:  '40mg'$  kenalog and 53m marcaine injected into knee joint. Fluid seen entering the joint capsule.   Completed without difficulty   Pain immediately resolved  suggesting accurate placement of the medication.   Advised to call if fevers/chills, erythema, induration, drainage, or persistent bleeding.   Images permanently stored and available for review in the ultrasound unit.  Impression: Technically successful ultrasound guided injection.        Assessment and Plan: 65y.o. male with Right knee pain.  Pain thought due to be lateral meniscus tear exacerbation. Plan for repeat steroid injection if this is not sufficient to control pain or does not last long next step would be either MRI or hyaluronic acid injection.  However last time we did this steroid injection was December 2021 expected hopefully should have a similar result.  Recheck back as needed.   PDMP not reviewed this encounter. Orders Placed This Encounter  Procedures   [place] JO[place] SPACE STRUCTURES LOW RIGHT(NO LINKED CHARGES)    Standing Status:   Future    Number of Occurrences:   1    Standing Expiration Date:   11/22/2021    Order Specific Question:   Reason for Exam (SYMPTOM  OR DIAGNOSIS REQUIRED)    Answer:   right knee pain    Order Specific Question:   Preferred imaging location?    Answer:   [place]  No orders of the defined types were placed in this encounter.    Discussed warning signs or symptoms. Please see discharge instructions. Patient expresses understanding.   The above documentation has been reviewed and is accurate and complete ELynne Leader M.D.

## 2021-05-25 NOTE — Patient Instructions (Signed)
Good to see you today.  You had a R knee injection.  Call or go to the ER if you develop a large red swollen joint with extreme pain or oozing puss.   Follow-up as needed.  I can do these injections every 3 months as needed.

## 2021-06-12 ENCOUNTER — Other Ambulatory Visit (INDEPENDENT_AMBULATORY_CARE_PROVIDER_SITE_OTHER): Payer: Managed Care, Other (non HMO) | Admitting: Cardiovascular Disease

## 2021-06-12 MED ORDER — MOLNUPIRAVIR 200 MG PO CAPS: 4.0000 | ORAL_CAPSULE | Freq: Two times a day (BID) | ORAL | 0 refills | Status: AC

## 2021-06-12 NOTE — Progress Notes (Signed)
Brian Higgins tested positive for COVID yesterday  Home test Feels like he has the flu  Will send in a prescription for Molnupiravir for 5 days He is to continue the vit C, Vit D, zinc  He will call if he worsens    Mertie Moores, MD  06/12/2021 [facility] [address]  [place], [place]  [address] Phone: [phone]; Fax: [phone]

## 2021-08-08 ENCOUNTER — Ambulatory Visit: Payer: Managed Care, Other (non HMO) | Admitting: Family Medicine

## 2021-08-08 VITALS — BP 126/70 | HR 61 | Temp 98.1°F | Resp 17 | Ht 74.0 in | Wt 269.0 lb

## 2021-08-08 DIAGNOSIS — U071 COVID-19: Secondary | ICD-10-CM | POA: Diagnosis not present

## 2021-08-08 DIAGNOSIS — Z23 Encounter for immunization: Secondary | ICD-10-CM

## 2021-08-08 DIAGNOSIS — G9339 Other post infection and related fatigue syndromes: Secondary | ICD-10-CM | POA: Diagnosis not present

## 2021-08-08 DIAGNOSIS — E785 Hyperlipidemia, unspecified: Secondary | ICD-10-CM

## 2021-08-08 DIAGNOSIS — R7303 Prediabetes: Secondary | ICD-10-CM | POA: Diagnosis not present

## 2021-08-08 LAB — COMPREHENSIVE METABOLIC PANEL
ALT: 26 U/L (ref 0–53)
AST: 20 U/L (ref 0–37)
Albumin: 4.7 g/dL (ref 3.5–5.2)
Alkaline Phosphatase: 49 U/L (ref 39–117)
BUN: 20 mg/dL (ref 6–23)
CO2: 24 mEq/L (ref 19–32)
Calcium: 9.5 mg/dL (ref 8.4–10.5)
Chloride: 104 mEq/L (ref 96–112)
Creatinine, Ser: 0.92 mg/dL (ref 0.40–1.50)
GFR: 87.66 mL/min (ref >60.00)
Glucose, Bld: 128 mg/dL — ABNORMAL HIGH (ref 70–99)
Potassium: 3.9 mEq/L (ref 3.5–5.1)
Sodium: 140 mEq/L (ref 135–145)
Total Bilirubin: 0.6 mg/dL (ref 0.2–1.2)
Total Protein: 6.9 g/dL (ref 6.0–8.3)

## 2021-08-08 LAB — LIPID PANEL
Cholesterol: 215 mg/dL — ABNORMAL HIGH (ref 0–200)
HDL: 46 mg/dL (ref >39.00)
NonHDL: 168.95
Total CHOL/HDL Ratio: 5
Triglycerides: 377 mg/dL — ABNORMAL HIGH (ref 0.0–149.0)
VLDL: 75.4 mg/dL — ABNORMAL HIGH (ref 0.0–40.0)

## 2021-08-08 LAB — CBC
HCT: 42.7 % (ref 39.0–52.0)
Hemoglobin: 14.9 g/dL (ref 13.0–17.0)
MCHC: 34.8 g/dL (ref 30.0–36.0)
MCV: 94.4 fl (ref 78.0–100.0)
Platelets: 161 10*3/uL (ref 150.0–400.0)
RBC: 4.53 Mil/uL (ref 4.22–5.81)
RDW: 14.4 % (ref 11.5–15.5)
WBC: 4.5 10*3/uL (ref 4.0–10.5)

## 2021-08-08 LAB — HEMOGLOBIN A1C: Hgb A1c MFr Bld: 6 % (ref 4.6–6.5)

## 2021-08-08 LAB — TSH: TSH: 1.83 u[IU]/mL (ref 0.35–5.50)

## 2021-08-08 LAB — LDL CHOLESTEROL, DIRECT: Direct LDL: 125 mg/dL

## 2021-08-08 NOTE — Progress Notes (Signed)
Subjective:  Patient ID: Brian Higgins, male    DOB: 10-08-1955  Age: 65 y.o. MRN: [id number]  CC:  Chief Complaint  Patient presents with   Prediabetes    Pt reports doing okay, some fatigue post COVID and notes gained back weight he had lost    Fatigue    Had COVID 4-6 weeks ago, reports he has struggled greatly with fatigue and feeling like he needs several naps throughout the day.    Hyperlipidemia    Pt due for recheck on levels. ,   Hypertension    Pt reports article about HCTZ causing cancer would like to discuss if still safe to take     HPI Brian Higgins presents for   Prediabetes: Had been doing well with weight loss, but weight gain - 20-25 pounds with decreased activity with covid.  No sweet tea/soda.  Lab Results  Component Value Date   HGBA1C 5.9 02/05/2021   Wt Readings from Last 3 Encounters:  08/08/21 269 lb (122 kg)  05/25/21 263 lb 6.4 oz (119.5 kg)  02/05/21 261 lb 6.4 oz (118.6 kg)   Covid infection, fatigue Positive home test early October. HA, fatigue. No cough/dyspnea/chest pain. Some persistent fatigue. Wakes up at 3:15 am. Tried melatonin gummies - some relief. Usually 6 hrs sleep per night. Some snoring. No prior sleep study. No daytime somnolence prior to covid. Some improvement in fatigue - abe to return to gym. Able to play in state tennis tournament this weekend.  No hx of OSA or prior sleep study.   Hyperlipidemia: Lipitor Rx sent in June with plan of intermittent dosing - did not take. Some weight gain recently but had been down 15# in September vs. June.  Lab Results  Component Value Date   CHOL 189 02/05/2021   HDL 43.00 02/05/2021   LDLCALC 112 (H) 02/05/2021   TRIG 172.0 (H) 02/05/2021   CHOLHDL 4 02/05/2021   Lab Results  Component Value Date   ALT 27 02/05/2021   AST 23 02/05/2021   ALKPHOS 50 02/05/2021   BILITOT 0.9 02/05/2021   Hypertension: HCTZ 12.5mg  QD.  Home readings: 120/70 range.  No new side effects with meds,  discussed combo product with recall, not HCTZ.  BP Readings from Last 3 Encounters:  08/08/21 126/70  05/25/21 130/72  02/05/21 128/76   Lab Results  Component Value Date   CREATININE 0.85 02/05/2021      History Patient Active Problem List   Diagnosis Date Noted   Tibialis posterior tendinitis, right 04/07/2018   Leg length discrepancy 04/07/2018   OA (osteoarthritis) of knee 01/13/2017   Prostate cancer ([place]) 11/21/2012   Past Medical History:  Diagnosis Date   Allergy    Arthritis    Cancer ([place])    Phreesia 07/30/2020   GERD (gastroesophageal reflux disease)    mild no meds   Hypertension    Prostate cancer [facility])    Past Surgical History:  Procedure Laterality Date   HAND TENDON SURGERY     Right  arm   JOINT REPLACEMENT N/A    Phreesia 07/30/2020   PROSTATE SURGERY N/A    Phreesia 07/30/2020   PROSTATECTOMY     ROTATOR CUFF REPAIR Bilateral left 2007 and right 09/2009   TOTAL KNEE ARTHROPLASTY Left 01/13/2017   Procedure: LEFT TOTAL KNEE ARTHROPLASTY;  Surgeon: Gaynelle Arabian, MD;  Location: [facility];  Service: Orthopedics;  Laterality: Left;   Allergies  Allergen Reactions   Other  Catgut suture did not dissolve--causes infection (2007)   Prior to Admission medications   Medication Sig Start Date End Date Taking? Authorizing Provider  atorvastatin (LIPITOR) 10 MG tablet Take 1 tablet (10 mg total) by mouth daily. Can start with few day per week dosing, increase as tolerated. 02/27/21  Yes Wendie Agreste, MD  cetirizine (ZYRTEC) 10 MG tablet Take 10 mg by mouth daily as needed for allergies (for seasonal allergies (pt. takes in Spring & Fall)).    Yes [provider]  hydrochlorothiazide (MICROZIDE) 12.5 MG capsule Take 1 capsule (12.5 mg total) by mouth daily. 02/05/21  Yes Wendie Agreste, MD   Social History   Socioeconomic History   Marital status: Married    Spouse name: Not on file   Number of children: Not on file   Years of  education: Not on file   Highest education level: Not on file  Occupational History   Not on file  Tobacco Use   Smoking status: Never   Smokeless tobacco: Never  Substance and Sexual Activity   Alcohol use: Yes    Alcohol/week: 6.0 - 7.0 standard drinks    Types: 4 Standard drinks or equivalent, 2 - 3 Glasses of wine per week    Comment: x a week   Drug use: No   Sexual activity: Yes  Other Topics Concern   Not on file  Social History Narrative   Not on file   Social Determinants of Health   Financial Resource Strain: Not on file  Food Insecurity: Not on file  Transportation Needs: Not on file  Physical Activity: Not on file  Stress: Not on file  Social Connections: Not on file  Intimate Partner Violence: Not on file    Review of Systems  Constitutional:  Negative for fatigue and unexpected weight change.  Eyes:  Negative for visual disturbance.  Respiratory:  Negative for cough, chest tightness and shortness of breath.   Cardiovascular:  Negative for chest pain, palpitations and leg swelling.  Gastrointestinal:  Negative for abdominal pain and blood in stool.  Neurological:  Negative for dizziness, light-headedness and headaches.    Objective:   Vitals:   08/08/21 1313  BP: 126/70  Pulse: 61  Resp: 17  Temp: 98.1 F (36.7 C)  TempSrc: Temporal  SpO2: 94%  Weight: 269 lb (122 kg)  Height: 6\' 2"  (1.88 m)     Physical Exam Vitals reviewed.  Constitutional:      Appearance: He is well-developed.  HENT:     Head: Normocephalic and atraumatic.  Neck:     Vascular: No carotid bruit or JVD.  Cardiovascular:     Rate and Rhythm: Normal rate and regular rhythm.     Heart sounds: Normal heart sounds. No murmur heard. Pulmonary:     Effort: Pulmonary effort is normal.     Breath sounds: Normal breath sounds. No rales.  Musculoskeletal:     Right lower leg: No edema.     Left lower leg: No edema.  Skin:    General: Skin is warm and dry.  Neurological:      Mental Status: He is alert and oriented to person, place, and time.  Psychiatric:        Mood and Affect: Mood normal.       Assessment & Plan:  Brian Higgins is a 65 y.o. male . Hyperlipidemia, unspecified hyperlipidemia type - Plan: Comprehensive metabolic panel, Lipid panel  -Updated labs ordered, can consider statin or intermittent  statin depending on levels.  Prediabetes - Plan: Comprehensive metabolic panel, Hemoglobin A1c  -Commended on previous weight loss, unfortunately some weight gain with COVID infection.  Has returned to exercise, some limitations with fatigue but able to play tennis.  A1c ordered.  He did ask about GIP/GLP-1 agonist for weight loss, will check labs as above and discuss further at follow-up visit.  Need for influenza vaccination - Plan: Flu Vaccine QUAD 6+ mos PF IM (Fluarix Quad PF)  COVID-19 virus infection Other post infection and related fatigue syndromes - Plan: TSH, CBC  -Improving but still some fatigue, likely post viral.  Denies chest pain, dyspnea.  Some difficulty with sleep.  Possible underlying sleep apnea.  Check TSH, CBC, recheck in 6 weeks as some improvement.  Consider sleep study.  No orders of the defined types were placed in this encounter.  Patient Instructions  If fatigue is not continuing to improve or continued difficulty with sleep, I recommend a sleep study. Let me know.  If any concerns on labs I will let you know.  Recheck in 6 weeks.  Sooner if new or worsening symptoms.  Thank you for coming in today.    Signed,   [name], MD [place] 08/08/21 1:44 PM

## 2021-08-08 NOTE — Patient Instructions (Addendum)
If fatigue is not continuing to improve or continued difficulty with sleep, I recommend a sleep study. Let me know.  If any concerns on labs I will let you know.  Recheck in 6 weeks.  Sooner if new or worsening symptoms.  Thank you for coming in today.

## 2021-08-14 ENCOUNTER — Other Ambulatory Visit: Payer: Self-pay | Admitting: Family Medicine

## 2021-08-14 DIAGNOSIS — E785 Hyperlipidemia, unspecified: Secondary | ICD-10-CM

## 2021-08-14 MED ORDER — ATORVASTATIN CALCIUM 10 MG PO TABS: 10.0000 mg | ORAL_TABLET | Freq: Every day | ORAL | 0 refills | Status: DC

## 2021-09-11 ENCOUNTER — Encounter: Payer: Self-pay | Admitting: Family

## 2021-09-11 ENCOUNTER — Other Ambulatory Visit: Payer: Self-pay

## 2021-09-11 ENCOUNTER — Telehealth (INDEPENDENT_AMBULATORY_CARE_PROVIDER_SITE_OTHER): Payer: Managed Care, Other (non HMO) | Admitting: Family

## 2021-09-11 VITALS — Ht 74.0 in | Wt 269.0 lb

## 2021-09-11 DIAGNOSIS — R051 Acute cough: Secondary | ICD-10-CM

## 2021-09-11 DIAGNOSIS — J069 Acute upper respiratory infection, unspecified: Secondary | ICD-10-CM | POA: Diagnosis not present

## 2021-09-11 DIAGNOSIS — J029 Acute pharyngitis, unspecified: Secondary | ICD-10-CM | POA: Insufficient documentation

## 2021-09-11 MED ORDER — BENZONATATE 200 MG PO CAPS: 200.0000 mg | ORAL_CAPSULE | Freq: Three times a day (TID) | ORAL | 0 refills | Status: DC | PRN

## 2021-09-11 MED ORDER — METHYLPREDNISOLONE 4 MG PO TBPK: ORAL_TABLET | ORAL | 0 refills | Status: DC

## 2021-09-11 MED ORDER — AMOXICILLIN-POT CLAVULANATE 875-125 MG PO TABS
1.0000 | ORAL_TABLET | Freq: Two times a day (BID) | ORAL | 0 refills | Status: DC
Start: 2021-09-11 — End: 2021-09-27

## 2021-09-11 NOTE — Patient Instructions (Signed)
Antibiotic sent to preferred pharmacy.  Medrol dose pack sent also, please start.  Sent cough capsules as well.  Please increase oral fluids, steamy hot shower/humidifier prn.  Please follow up if no improvement in 2-3 days.   It was a pleasure seeing you today! Please do not hesitate to reach out with any questions and or concerns.  Regards,   Eugenia Pancoast

## 2021-09-11 NOTE — Assessment & Plan Note (Signed)
Warm salt water gargles prn  antbx sent to pharmacy.

## 2021-09-11 NOTE — Assessment & Plan Note (Signed)
Prescription sent to pharmacy for Medrol Dosepak patient to start and take as prescribed also sent cough capsules as needed.

## 2021-09-11 NOTE — Assessment & Plan Note (Addendum)
Cough capsule sent to pharmacy as well as medrol dose pack. Sent augmentin to pharmacy as well. Take antibiotic as prescribed. Increase oral fluids. Pt to f/u if sx worsen and or fail to improve in 2-3 days.

## 2021-09-11 NOTE — Assessment & Plan Note (Deleted)
Warm salt water gargles prn  antbx sent to pharmacy.

## 2021-09-11 NOTE — Progress Notes (Signed)
MyChart Video Visit    Virtual Visit via Video Note   This visit type was conducted due to national recommendations for restrictions regarding the COVID-19 Pandemic (e.g. social distancing) in an effort to limit this patient's exposure and mitigate transmission in our community. This patient is at least at moderate risk for complications without adequate follow up. This format is felt to be most appropriate for this patient at this time. Physical exam was limited by quality of the video and audio technology used for the visit. CMA was able to get the patient set up on a video visit.  Patient location: Home. Patient and provider in visit Provider location: Office  I discussed the limitations of evaluation and management by telemedicine and the availability of in person appointments. The patient expressed understanding and agreed to proceed.  Visit Date: 09/11/2021  Today's healthcare provider: Eugenia Pancoast, FNP     Subjective:    Patient ID: Brian Higgins, male    DOB: [birth year], 65 y.o.   MRN: [id number]  Chief Complaint  Patient presents with   Nasal Congestion   Sore Throat    Sore Throat  Associated symptoms include congestion and coughing (wet productive cough with clear sputum). Pertinent negatives include no ear pain or shortness of breath.   Pt here with c/o sore throat, neck feeling like lymph nodes swollen in neck, nasal congestion, cough with clear sputum. No sob, no fever. Some chills , no ear pain. Taking nyquil and mucinex with only mild relief. With some chest congestion.   Sx have been the past 15 days.   Past Medical History:  Diagnosis Date   Allergy    Arthritis    Cancer ([place])    Phreesia 07/30/2020   GERD (gastroesophageal reflux disease)    mild no meds   Hypertension    Prostate cancer [facility])     Past Surgical History:  Procedure Laterality Date   HAND TENDON SURGERY     Right  arm   JOINT REPLACEMENT N/A    Phreesia 07/30/2020    PROSTATE SURGERY N/A    Phreesia 07/30/2020   PROSTATECTOMY     ROTATOR CUFF REPAIR Bilateral left 2007 and right 09/2009   TOTAL KNEE ARTHROPLASTY Left 01/13/2017   Procedure: LEFT TOTAL KNEE ARTHROPLASTY;  Surgeon: Gaynelle Arabian, MD;  Location: [facility];  Service: Orthopedics;  Laterality: Left;    Family History  Problem Relation Age of Onset   Cancer Mother    Cancer Father    Diabetes Father    Hyperlipidemia Father    Cancer Sister    Stroke Maternal Grandmother    Heart disease Maternal Grandfather     Social History   Socioeconomic History   Marital status: Married    Spouse name: Not on file   Number of children: Not on file   Years of education: Not on file   Highest education level: Not on file  Occupational History   Not on file  Tobacco Use   Smoking status: Never   Smokeless tobacco: Never  Substance and Sexual Activity   Alcohol use: Yes    Alcohol/week: 6.0 - 7.0 standard drinks    Types: 4 Standard drinks or equivalent, 2 - 3 Glasses of wine per week    Comment: x a week   Drug use: No   Sexual activity: Yes  Other Topics Concern   Not on file  Social History Narrative   Not on file   Social Determinants of  Health   Financial Resource Strain: Not on file  Food Insecurity: Not on file  Transportation Needs: Not on file  Physical Activity: Not on file  Stress: Not on file  Social Connections: Not on file  Intimate Partner Violence: Not on file    Outpatient Medications Prior to Visit  Medication Sig Dispense Refill   atorvastatin (LIPITOR) 10 MG tablet Take 1 tablet (10 mg total) by mouth daily. Can start with few day per week dosing, increase as tolerated. 90 tablet 0   cetirizine (ZYRTEC) 10 MG tablet Take 10 mg by mouth daily as needed for allergies (for seasonal allergies (pt. takes in Spring & Fall)).      hydrochlorothiazide (MICROZIDE) 12.5 MG capsule Take 1 capsule (12.5 mg total) by mouth daily. 90 capsule 3   No facility-administered  medications prior to visit.    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 Negative for fever.  HENT:  Positive for congestion, sneezing (occasional) and sore throat. Negative for ear pain, sinus pressure and sinus pain.   Respiratory:  Positive for cough (wet productive cough with clear sputum). Negative for shortness of breath and wheezing.   Cardiovascular:  Negative for chest pain and palpitations.      Objective:    Physical Exam Constitutional:      General: He is not in acute distress.    Appearance: He is well-developed. He is not ill-appearing, toxic-appearing or diaphoretic.  HENT:     Head: Normocephalic.  Pulmonary:     Effort: Pulmonary effort is normal.  Neurological:     General: No focal deficit present.     Mental Status: He is alert and oriented to person, place, and time.  Psychiatric:        Mood and Affect: Mood normal.        Behavior: Behavior normal.    Ht 6\' 2"  (1.88 m)    Wt 269 lb (122 kg)    BMI 34.54 kg/m  Wt Readings from Last 3 Encounters:  09/11/21 269 lb (122 kg)  08/08/21 269 lb (122 kg)  05/25/21 263 lb 6.4 oz (119.5 kg)       Assessment & Plan:   Problem List Items Addressed This Visit       Respiratory   Acute upper respiratory infection    Cough capsule sent to pharmacy as well as medrol dose pack. Sent augmentin to pharmacy as well. Take antibiotic as prescribed. Increase oral fluids. Pt to f/u if sx worsen and or fail to improve in 2-3 days.       Relevant Medications   amoxicillin-clavulanate (AUGMENTIN) 875-125 MG tablet   benzonatate (TESSALON) 200 MG capsule     Other   Sore throat - Primary    Warm salt water gargles prn  antbx sent to pharmacy.       Relevant Medications   amoxicillin-clavulanate (AUGMENTIN) 875-125 MG tablet   benzonatate (TESSALON) 200 MG capsule   Acute cough     Prescription sent to pharmacy for Medrol Dosepak patient to start and take as prescribed also sent cough capsules as needed.      Relevant Medications   methylPREDNISolone (MEDROL DOSEPAK) 4 MG TBPK tablet    I am having Brian Higgins start on amoxicillin-clavulanate, benzonatate, and methylPREDNISolone. I am also having him maintain his cetirizine, hydrochlorothiazide, and atorvastatin.  Meds ordered this  encounter  Medications   amoxicillin-clavulanate (AUGMENTIN) 875-125 MG tablet    Sig: Take 1 tablet by mouth 2 (two) times daily.    Dispense:  20 tablet    Refill:  0    Order Specific Question:   Supervising Provider    Answer:   BEDSOLE, AMY E [[id number]]   benzonatate (TESSALON) 200 MG capsule    Sig: Take 1 capsule (200 mg total) by mouth 3 (three) times daily as needed for cough.    Dispense:  20 capsule    Refill:  0    Order Specific Question:   Supervising Provider    Answer:   BEDSOLE, AMY E [[id number]]   methylPREDNISolone (MEDROL DOSEPAK) 4 MG TBPK tablet    Sig: Take per package instructions    Dispense:  21 tablet    Refill:  0    Order Specific Question:   Supervising Provider    Answer:   BEDSOLE, AMY E [[id number]]    I discussed the assessment and treatment plan with the patient. The patient was provided an opportunity to ask questions and all were answered. The patient agreed with the plan and demonstrated an understanding of the instructions.   The patient was advised to call back or seek an in-person evaluation if the symptoms worsen or if the condition fails to improve as anticipated.  I provided 18 minutes of face-to-face time during this encounter.   Eugenia Pancoast, [place] [facility] [place] [phone] (phone) [phone] (fax)  [place]

## 2021-09-26 ENCOUNTER — Ambulatory Visit: Payer: Managed Care, Other (non HMO) | Admitting: Family Medicine

## 2021-09-27 ENCOUNTER — Ambulatory Visit: Payer: Managed Care, Other (non HMO) | Admitting: Family Medicine

## 2021-09-27 VITALS — BP 138/80 | HR 75 | Temp 98.2°F | Resp 16 | Ht 74.0 in | Wt 266.8 lb

## 2021-09-27 DIAGNOSIS — R052 Subacute cough: Secondary | ICD-10-CM | POA: Diagnosis not present

## 2021-09-27 DIAGNOSIS — I10 Essential (primary) hypertension: Secondary | ICD-10-CM | POA: Diagnosis not present

## 2021-09-27 DIAGNOSIS — E785 Hyperlipidemia, unspecified: Secondary | ICD-10-CM

## 2021-09-27 LAB — COMPREHENSIVE METABOLIC PANEL
ALT: 31 U/L (ref 0–53)
AST: 21 U/L (ref 0–37)
Albumin: 4.5 g/dL (ref 3.5–5.2)
Alkaline Phosphatase: 40 U/L (ref 39–117)
BUN: 22 mg/dL (ref 6–23)
CO2: 27 mEq/L (ref 19–32)
Calcium: 9.4 mg/dL (ref 8.4–10.5)
Chloride: 106 mEq/L (ref 96–112)
Creatinine, Ser: 0.86 mg/dL (ref 0.40–1.50)
GFR: 91.16 mL/min (ref >60.00)
Glucose, Bld: 97 mg/dL (ref 70–99)
Potassium: 4.1 mEq/L (ref 3.5–5.1)
Sodium: 140 mEq/L (ref 135–145)
Total Bilirubin: 1.1 mg/dL (ref 0.2–1.2)
Total Protein: 6.7 g/dL (ref 6.0–8.3)

## 2021-09-27 LAB — LIPID PANEL
Cholesterol: 157 mg/dL (ref 0–200)
HDL: 45.7 mg/dL (ref >39.00)
LDL Cholesterol: 82 mg/dL (ref 0–99)
NonHDL: 111.61
Total CHOL/HDL Ratio: 3
Triglycerides: 148 mg/dL (ref 0.0–149.0)
VLDL: 29.6 mg/dL (ref 0.0–40.0)

## 2021-09-27 MED ORDER — BENZONATATE 200 MG PO CAPS: 200.0000 mg | ORAL_CAPSULE | Freq: Three times a day (TID) | ORAL | 0 refills | Status: DC | PRN

## 2021-09-27 MED ORDER — ATORVASTATIN CALCIUM 10 MG PO TABS: 10.0000 mg | ORAL_TABLET | Freq: Every day | ORAL | 1 refills | Status: DC

## 2021-09-27 MED ORDER — HYDROCHLOROTHIAZIDE 12.5 MG PO CAPS: 12.5000 mg | ORAL_CAPSULE | Freq: Every day | ORAL | 3 refills | Status: DC

## 2021-09-27 MED ORDER — PROMETHAZINE-DM 6.25-15 MG/5ML PO SYRP: 2.5000 mL | ORAL_SOLUTION | Freq: Every evening | ORAL | 0 refills | Status: DC | PRN

## 2021-09-27 NOTE — Progress Notes (Signed)
Subjective:  Patient ID: Brian Higgins, male    DOB: 1955/10/29  Age: 66 y.o. MRN: [id number]  CC:  Chief Complaint  Patient presents with   Hypertension    Pt has been okay not checked bp   Hyperlipidemia    Pt here for recheck    Cough    Pt has just finished up abx had cough and some other congestion for a few weeks but cough has remained post abx     HPI Brian Higgins presents for   Hypertension: HCTZ 12.5 mg daily. No new side effects.  Home readings: 128/72.  BP Readings from Last 3 Encounters:  09/27/21 138/80  08/08/21 126/70  05/25/21 130/72   Lab Results  Component Value Date   CREATININE 0.92 08/08/2021   Hyperlipidemia: Lipitor 10 mg daily. Fasting today (not in November). No new myalgias.  Lab Results  Component Value Date   CHOL 215 (H) 08/08/2021   HDL 46.00 08/08/2021   LDLCALC 112 (H) 02/05/2021   LDLDIRECT 125.0 08/08/2021   TRIG 377.0 (H) 08/08/2021   CHOLHDL 5 08/08/2021   Lab Results  Component Value Date   ALT 26 08/08/2021   AST 20 08/08/2021   ALKPHOS 49 08/08/2021   BILITOT 0.6 08/08/2021   Cough Video visit December 20 for acute cough and sore throat, treated with Augmentin and Tessalon, Medrol Dosepak. Cough has remained. Mostly at night - wakes at times. Tessalon perles at night, sometimes in the middle of the night. Dry cough. No fever/dyspnea. Interfering with sleep.  Swollen glands better. Overall better except cough. Nyquil at times - min relief.  Some difficulty with sleep prior.   History Patient Active Problem List   Diagnosis Date Noted   Acute upper respiratory infection 09/11/2021   Sore throat 09/11/2021   Acute cough 09/11/2021   Tibialis posterior tendinitis, right 04/07/2018   Leg length discrepancy 04/07/2018   OA (osteoarthritis) of knee 01/13/2017   Prostate cancer ([place]) 11/21/2012   Past Medical History:  Diagnosis Date   Allergy    Arthritis    Cancer ([place])    Phreesia 07/30/2020   GERD  (gastroesophageal reflux disease)    mild no meds   Hypertension    Prostate cancer [facility])    Past Surgical History:  Procedure Laterality Date   HAND TENDON SURGERY     Right  arm   JOINT REPLACEMENT N/A    Phreesia 07/30/2020   PROSTATE SURGERY N/A    Phreesia 07/30/2020   PROSTATECTOMY     ROTATOR CUFF REPAIR Bilateral left 2007 and right 09/2009   TOTAL KNEE ARTHROPLASTY Left 01/13/2017   Procedure: LEFT TOTAL KNEE ARTHROPLASTY;  Surgeon: Gaynelle Arabian, MD;  Location: [facility];  Service: Orthopedics;  Laterality: Left;   Allergies  Allergen Reactions   Other     Catgut suture did not dissolve--causes infection (2007)   Prior to Admission medications   Medication Sig Start Date End Date Taking? Authorizing Provider  atorvastatin (LIPITOR) 10 MG tablet Take 1 tablet (10 mg total) by mouth daily. Can start with few day per week dosing, increase as tolerated. 08/14/21  Yes Wendie Agreste, MD  benzonatate (TESSALON) 200 MG capsule Take 1 capsule (200 mg total) by mouth 3 (three) times daily as needed for cough. 09/11/21  Yes Eugenia Pancoast, FNP  cetirizine (ZYRTEC) 10 MG tablet Take 10 mg by mouth daily as needed for allergies (for seasonal allergies (pt. takes in Spring & Fall)).    Yes  [provider]  hydrochlorothiazide (MICROZIDE) 12.5 MG capsule Take 1 capsule (12.5 mg total) by mouth daily. 02/05/21  Yes Wendie Agreste, MD  methylPREDNISolone (MEDROL DOSEPAK) 4 MG TBPK tablet Take per package instructions 09/11/21  Yes Eugenia Pancoast, FNP   Social History   Socioeconomic History   Marital status: Married    Spouse name: Not on file   Number of children: Not on file   Years of education: Not on file   Highest education level: Not on file  Occupational History   Not on file  Tobacco Use   Smoking status: Never   Smokeless tobacco: Never  Substance and Sexual Activity   Alcohol use: Yes    Alcohol/week: 6.0 - 7.0 standard drinks    Types: 4 Standard drinks  or equivalent, 2 - 3 Glasses of wine per week    Comment: x a week   Drug use: No   Sexual activity: Yes  Other Topics Concern   Not on file  Social History Narrative   Not on file   Social Determinants of Health   Financial Resource Strain: Not on file  Food Insecurity: Not on file  Transportation Needs: Not on file  Physical Activity: Not on file  Stress: Not on file  Social Connections: Not on file  Intimate Partner Violence: Not on file    Review of Systems  Per HPI.  Objective:   Vitals:   09/27/21 0842  BP: 138/80  Pulse: 75  Resp: 16  Temp: 98.2 F (36.8 C)  TempSrc: Temporal  SpO2: 96%  Weight: 266 lb 12.8 oz (121 kg)  Height: 6\' 2"  (1.88 m)     Physical Exam Vitals reviewed.  Constitutional:      Appearance: He is well-developed.  HENT:     Head: Normocephalic and atraumatic.  Neck:     Vascular: No carotid bruit or JVD.  Cardiovascular:     Rate and Rhythm: Normal rate and regular rhythm.     Heart sounds: Normal heart sounds. No murmur heard. Pulmonary:     Effort: Pulmonary effort is normal. No respiratory distress.     Breath sounds: Normal breath sounds. No wheezing, rhonchi or rales.  Musculoskeletal:     Right lower leg: No edema.     Left lower leg: No edema.  Skin:    General: Skin is warm and dry.  Neurological:     Mental Status: He is alert and oriented to person, place, and time.  Psychiatric:        Mood and Affect: Mood normal.       Assessment & Plan:  Brian Higgins is a 66 y.o. male . Subacute cough - Plan: promethazine-dextromethorphan (PROMETHAZINE-DM) 6.25-15 MG/5ML syrup, benzonatate (TESSALON) 200 MG capsule  -New concern,  suspected postinfectious cough, treat with Phenergan DM cough syrup at night, Tessalon Perles as needed.  Reassured exam.  RTC precautions.  Hyperlipidemia, unspecified hyperlipidemia type - Plan: atorvastatin (LIPITOR) 10 MG tablet, Lipid panel, Comprehensive metabolic panel  -Tolerating  statin, check labs, continue same dose  Essential hypertension - Plan: hydrochlorothiazide (MICROZIDE) 12.5 MG capsule, Comprehensive metabolic panel  -Check labs, continue HCTZ.  Borderline elevated in office but may be related to cough.  Home readings stable.  Recheck 1 month.  Reports some difficulty with sleep, possible longstanding difficulties with sleep, will discuss further at next visit.  Control cough initially as above.  Meds ordered this encounter  Medications   atorvastatin (LIPITOR) 10 MG tablet  Sig: Take 1 tablet (10 mg total) by mouth daily. Can start with few day per week dosing, increase as tolerated.    Dispense:  90 tablet    Refill:  1   hydrochlorothiazide (MICROZIDE) 12.5 MG capsule    Sig: Take 1 capsule (12.5 mg total) by mouth daily.    Dispense:  90 capsule    Refill:  3   promethazine-dextromethorphan (PROMETHAZINE-DM) 6.25-15 MG/5ML syrup    Sig: Take 2.5-5 mLs by mouth at bedtime as needed for cough.    Dispense:  118 mL    Refill:  0   benzonatate (TESSALON) 200 MG capsule    Sig: Take 1 capsule (200 mg total) by mouth 3 (three) times daily as needed for cough.    Dispense:  20 capsule    Refill:  0   Patient Instructions  Lungs clear today.  Cough is one of the last symptoms to go away after an infection.  Can use cough syrup if needed at bedtime, but I also refilled Tessalon Perles if more mild cough symptoms.  Follow-up in 1 month.  Can discuss sleep symptoms further at that time and hopefully cough will be resolved.  Follow-up sooner if any new or worsening symptoms.  Thanks for coming in today.  No change in meds for now.  I will let you know if there are concerns on labs.     Signed,   [name], MD [place], [facility] 09/27/21 9:19 AM

## 2021-09-27 NOTE — Patient Instructions (Signed)
Lungs clear today.  Cough is one of the last symptoms to go away after an infection.  Can use cough syrup if needed at bedtime, but I also refilled Tessalon Perles if more mild cough symptoms.  Follow-up in 1 month.  Can discuss sleep symptoms further at that time and hopefully cough will be resolved.  Follow-up sooner if any new or worsening symptoms.  Thanks for coming in today.  No change in meds for now.  I will let you know if there are concerns on labs.

## 2021-11-01 ENCOUNTER — Encounter: Payer: Self-pay | Admitting: Family Medicine

## 2021-11-01 ENCOUNTER — Ambulatory Visit (INDEPENDENT_AMBULATORY_CARE_PROVIDER_SITE_OTHER): Payer: Managed Care, Other (non HMO) | Admitting: Family Medicine

## 2021-11-01 VITALS — BP 130/74 | HR 87 | Temp 98.0°F | Resp 15 | Ht 74.0 in | Wt 268.8 lb

## 2021-11-01 DIAGNOSIS — E669 Obesity, unspecified: Secondary | ICD-10-CM | POA: Diagnosis not present

## 2021-11-01 DIAGNOSIS — R052 Subacute cough: Secondary | ICD-10-CM

## 2021-11-01 DIAGNOSIS — R7303 Prediabetes: Secondary | ICD-10-CM | POA: Diagnosis not present

## 2021-11-01 MED ORDER — WEGOVY 0.25 MG/0.5ML ~~LOC~~ SOAJ: 0.5000 mg | SUBCUTANEOUS | 0 refills | Status: DC

## 2021-11-01 NOTE — Patient Instructions (Addendum)
Start Wegovy once per week. Let me know how things are going and plan on higher dose of 1mg  next month. If nausea, can try lower dose. Keep me posted. Recheck 6 weeks  Cough should continue to improve, then resolve. Follow up if not resolving.

## 2021-11-01 NOTE — Progress Notes (Signed)
Subjective:  Patient ID: Kendel Bessey, male    DOB: Dec 05, 1955  Age: 66 y.o. MRN: [id number]  CC:  Chief Complaint  Patient presents with   Cough    Pt reports is doing well much better at this time    Insomnia    Has been doing well no concerns at this time    Weight Loss    Pt would like to talk about injectable medication for weight loss     HPI Kalid Ghan presents for   Cough Follow-up January 5 visit.  Subacute cough treated at that time with Tessalon Perles, Phenergan DM.  Thought to be residual cough from previous infection.  Symptoms have improved.  Sleeping better. Min cough with running hard only.   Obesity: With prediabetes.  Has tried various diets.  Exercises 5-6 days per week, pickleball, tennis up to 6 hrs. 3 meals per day. No snacking, low carbs. Tied keto in past, 20# loss, then regain. Multiple family members with DM. Sister with prediabetes, treated with metformin,then ozempic with good results.  Would like to try injection for weight loss.  Not interested in surgical options.  No FH of medullary thyroid CA or MEN syndrome No personal hx of pancreatitis.     Lab Results  Component Value Date   HGBA1C 6.0 08/08/2021      Wt Readings from Last 3 Encounters:  11/01/21 268 lb 12.8 oz (121.9 kg)  09/27/21 266 lb 12.8 oz (121 kg)  09/11/21 269 lb (122 kg)  Body mass index is 34.51 kg/m.        History Patient Active Problem List   Diagnosis Date Noted   Acute upper respiratory infection 09/11/2021   Sore throat 09/11/2021   Acute cough 09/11/2021   Tibialis posterior tendinitis, right 04/07/2018   Leg length discrepancy 04/07/2018   OA (osteoarthritis) of knee 01/13/2017   Prostate cancer ([place]) 11/21/2012   Past Medical History:  Diagnosis Date   Allergy    Arthritis    Cancer ([place])    [employer]reesia 07/30/2020   GERD (gastroesophageal reflux disease)    mild no meds   Hypertension    Prostate cancer [facility])    Past Surgical  History:  Procedure Laterality Date   HAND TENDON SURGERY     Right  arm   JOINT REPLACEMENT N/A    [employer]reesia 07/30/2020   PROSTATE SURGERY N/A    Phreesia 07/30/2020   PROSTATECTOMY     ROTATOR CUFF REPAIR Bilateral left 2007 and right 09/2009   TOTAL KNEE ARTHROPLASTY Left 01/13/2017   Procedure: LEFT TOTAL KNEE ARTHROPLASTY;  Surgeon: Gaynelle Arabian, MD;  Location: [facility];  Service: Orthopedics;  Laterality: Left;   Allergies  Allergen Reactions   Other     Catgut suture did not dissolve--causes infection (2007)   Prior to Admission medications   Medication Sig Start Date End Date Taking? Authorizing Provider  atorvastatin (LIPITOR) 10 MG tablet Take 1 tablet (10 mg total) by mouth daily. Can start with few day per week dosing, increase as tolerated. 09/27/21  Yes Wendie Agreste, MD  cetirizine (ZYRTEC) 10 MG tablet Take 10 mg by mouth daily as needed for allergies (for seasonal allergies (pt. takes in Spring & Fall)).    Yes [provider]  hydrochlorothiazide (MICROZIDE) 12.5 MG capsule Take 1 capsule (12.5 mg total) by mouth daily. 09/27/21  Yes Wendie Agreste, MD  methylPREDNISolone (MEDROL DOSEPAK) 4 MG TBPK tablet Take per package instructions 09/11/21  Yes  Eugenia Pancoast, FNP  promethazine-dextromethorphan (PROMETHAZINE-DM) 6.25-15 MG/5ML syrup Take 2.5-5 mLs by mouth at bedtime as needed for cough. 09/27/21  Yes Wendie Agreste, MD  benzonatate (TESSALON) 200 MG capsule Take 1 capsule (200 mg total) by mouth 3 (three) times daily as needed for cough. Patient not taking: Reported on 11/01/2021 09/27/21   Wendie Agreste, MD   Social History   Socioeconomic History   Marital status: Married    Spouse name: Not on file   Number of children: Not on file   Years of education: Not on file   Highest education level: Not on file  Occupational History   Not on file  Tobacco Use   Smoking status: Never   Smokeless tobacco: Never  Substance and Sexual Activity    Alcohol use: Yes    Alcohol/week: 6.0 - 7.0 standard drinks    Types: 4 Standard drinks or equivalent, 2 - 3 Glasses of wine per week    Comment: x a week   Drug use: No   Sexual activity: Yes  Other Topics Concern   Not on file  Social History Narrative   Not on file   Social Determinants of Health   Financial Resource Strain: Not on file  Food Insecurity: Not on file  Transportation Needs: Not on file  Physical Activity: Not on file  Stress: Not on file  Social Connections: Not on file  Intimate Partner Violence: Not on file    Review of Systems  Per HPI Objective:   Vitals:   11/01/21 1544  BP: 130/74  Pulse: 87  Resp: 15  Temp: 98 F (36.7 C)  TempSrc: Temporal  SpO2: 97%  Weight: 268 lb 12.8 oz (121.9 kg)  Height: 6\' 2"  (1.88 m)     Physical Exam Vitals reviewed.  Constitutional:      General: He is not in acute distress.    Appearance: He is well-developed. He is obese.  HENT:     Head: Normocephalic and atraumatic.  Neck:     Vascular: No carotid bruit or JVD.  Cardiovascular:     Rate and Rhythm: Normal rate and regular rhythm.     Heart sounds: Normal heart sounds. No murmur heard. Pulmonary:     Effort: Pulmonary effort is normal. No respiratory distress.     Breath sounds: Normal breath sounds. No stridor. No wheezing, rhonchi or rales.  Musculoskeletal:     Right lower leg: No edema.     Left lower leg: No edema.  Skin:    General: Skin is warm and dry.  Neurological:     Mental Status: He is alert and oriented to person, place, and time.  Psychiatric:        Mood and Affect: Mood normal.       Assessment & Plan:  Jabre Heo is a 66 y.o. male . Obesity (BMI 30.0-34.9) - Plan: Semaglutide-Weight Management (WEGOVY) 0.25 MG/0.5ML SOAJ Prediabetes - Plan: Semaglutide-Weight Management (WEGOVY) 0.25 MG/0.5ML SOAJ  -Difficulty with weight loss in spite of multiple diets tried previously, significant exercise as above as well as  monitoring diet.  With underlying prediabetes.  Deferred surgical options at this time or meeting with medical weight loss specialist.  -Start Wegovy 0.5 mg weekly initially with potential side effects discussed, option to decrease to 0.25 mg if nausea.  Stay at 0.5 weekly for 1 month, then if tolerated will increase to 1.0 mg weekly.  6-week follow-up.  Potential side effects and risks discussed.  Subacute cough  -Improving, lungs clear.  RTC precautions if not continuing to resolve.  Meds ordered this encounter  Medications   Semaglutide-Weight Management (WEGOVY) 0.25 MG/0.5ML SOAJ    Sig: Inject 0.5 mg into the skin once a week.    Dispense:  2 mL    Refill:  0   Patient Instructions  Start Wegovy once per week. Let me know how things are going and plan on higher dose of 1mg  next month. If nausea, can try lower dose. Keep me posted. Recheck 6 weeks  Cough should continue to improve, then resolve. Follow up if not resolving.     Signed,   [name], MD [place] 11/01/21 10:01 PM

## 2021-11-02 ENCOUNTER — Other Ambulatory Visit: Payer: Self-pay | Admitting: Family Medicine

## 2021-11-02 ENCOUNTER — Encounter: Payer: Self-pay | Admitting: Family Medicine

## 2021-11-02 DIAGNOSIS — E669 Obesity, unspecified: Secondary | ICD-10-CM

## 2021-11-02 DIAGNOSIS — R7303 Prediabetes: Secondary | ICD-10-CM

## 2021-11-02 MED ORDER — WEGOVY 0.25 MG/0.5ML ~~LOC~~ SOAJ: 0.5000 mg | SUBCUTANEOUS | 0 refills | Status: DC

## 2021-11-02 NOTE — Telephone Encounter (Signed)
Patient would like to know some possible alternatives for [facility] because it's not in stock and also would cost the patient 1,500.00 a month. I will let patient know we do not email prescriptions.

## 2021-11-02 NOTE — Progress Notes (Signed)
See my chart message.  Printed prescription to check into availability at other pharmacies.

## 2021-11-05 ENCOUNTER — Other Ambulatory Visit: Payer: Self-pay | Admitting: Family Medicine

## 2021-11-05 DIAGNOSIS — R7303 Prediabetes: Secondary | ICD-10-CM

## 2021-11-05 DIAGNOSIS — E669 Obesity, unspecified: Secondary | ICD-10-CM

## 2021-11-05 MED ORDER — WEGOVY 0.25 MG/0.5ML ~~LOC~~ SOAJ: 0.5000 mg | SUBCUTANEOUS | 0 refills | Status: DC

## 2021-11-05 NOTE — Progress Notes (Signed)
Reprinted Rx.

## 2021-11-08 ENCOUNTER — Encounter: Payer: Self-pay | Admitting: Family Medicine

## 2021-11-08 DIAGNOSIS — E669 Obesity, unspecified: Secondary | ICD-10-CM

## 2021-11-08 DIAGNOSIS — R7303 Prediabetes: Secondary | ICD-10-CM

## 2021-11-08 NOTE — Telephone Encounter (Signed)
Pt states Mounjaro and Ozempic covered and is asking you switch to one of those. Please advise

## 2021-11-09 MED ORDER — OZEMPIC (0.25 OR 0.5 MG/DOSE) 2 MG/1.5ML ~~LOC~~ SOPN: 0.2500 mg | PEN_INJECTOR | SUBCUTANEOUS | 1 refills | Status: DC

## 2021-11-19 NOTE — Telephone Encounter (Signed)
Do you happen to have a PA for this patient for Ozempic.

## 2021-11-22 ENCOUNTER — Other Ambulatory Visit: Payer: Self-pay | Admitting: Family Medicine

## 2021-11-22 DIAGNOSIS — E669 Obesity, unspecified: Secondary | ICD-10-CM

## 2021-11-22 DIAGNOSIS — R7303 Prediabetes: Secondary | ICD-10-CM

## 2021-11-22 NOTE — Telephone Encounter (Signed)
PA has been submitted and waiting on determination ?

## 2021-11-28 ENCOUNTER — Encounter: Payer: Self-pay | Admitting: Family Medicine

## 2021-11-28 NOTE — Telephone Encounter (Signed)
Resubmitted PA

## 2021-12-03 NOTE — Telephone Encounter (Signed)
Have the insurance let you know if this has been approved? ? ?

## 2021-12-04 ENCOUNTER — Telehealth: Payer: Self-pay

## 2021-12-04 NOTE — Telephone Encounter (Signed)
I am not sure what additional information is needed.  He has tried to lose weight with diet, exercise and has history of prediabetes.  Did they have something particular that was needed on a letter? ?

## 2021-12-04 NOTE — Telephone Encounter (Signed)
Checked cover my med status and still shows as "submitted and waiting for determination" will call insurance today as this should have been returned last week.  ? ?

## 2021-12-04 NOTE — Telephone Encounter (Signed)
Wrote letter and faxed to insurance to ensure that we meet the deadline, I also attached A1c level  ?

## 2021-12-04 NOTE — Telephone Encounter (Signed)
Called pt insurance and they have stated tomorrow is the end of 5th business day is tomorrow meaning this is when determination will be returned. They did recommend giving further information as prediabetes is not a covered diagnosis and would need additional rational to receive a covered status.  ? ?We can fax letter to [phone] ?With case # [id number] noted on documentation  ?

## 2021-12-13 ENCOUNTER — Ambulatory Visit: Payer: Managed Care, Other (non HMO) | Admitting: Family Medicine

## 2021-12-14 ENCOUNTER — Encounter: Payer: Self-pay | Admitting: Family Medicine

## 2021-12-14 DIAGNOSIS — R7303 Prediabetes: Secondary | ICD-10-CM

## 2021-12-14 DIAGNOSIS — E669 Obesity, unspecified: Secondary | ICD-10-CM

## 2021-12-14 NOTE — Telephone Encounter (Signed)
Ozempic was denied even with Dx Prediabetes the insurance states Type 2 Diabetes only, placed the denial letter in your review folder  ?

## 2021-12-18 ENCOUNTER — Encounter: Payer: Self-pay | Admitting: Family Medicine

## 2021-12-18 ENCOUNTER — Other Ambulatory Visit: Payer: Self-pay

## 2021-12-18 ENCOUNTER — Ambulatory Visit: Payer: Managed Care, Other (non HMO) | Admitting: Family Medicine

## 2021-12-18 ENCOUNTER — Ambulatory Visit: Payer: Self-pay

## 2021-12-18 ENCOUNTER — Ambulatory Visit (INDEPENDENT_AMBULATORY_CARE_PROVIDER_SITE_OTHER): Payer: Managed Care, Other (non HMO)

## 2021-12-18 VITALS — BP 132/78 | HR 60 | Ht 74.0 in | Wt 273.8 lb

## 2021-12-18 DIAGNOSIS — M79641 Pain in right hand: Secondary | ICD-10-CM | POA: Diagnosis not present

## 2021-12-18 NOTE — Progress Notes (Signed)
? ?  I, [name], LAT, ATC, am serving as scribe for Dr. Lynne Leader. ? ?Brian Higgins is a 66 y.o. male who presents to [place] [facility] today for R hand pain, specifically R middle and ring fingers.  He was last seen by Dr. Georgina Snell on 05/25/21 for R knee pain.  Today, pt reports R hand pain since 12/16/21 when he injured his R hand while playing golf and struck the ground w/ the club.  He locates his pain to his R middle and ring fingers. ? ?Swelling: yes in R middle and ring fingers and distal hand at R 3rd and 4th MCP ?Paresthesias: no ?Aggravating factors: R middle and ring finger PIP flexion and MCP flexion; making a fist; typing ?Treatments tried: ice; Aleve  ? ? ?Pertinent review of systems: No fevers or chills ? ?Relevant historical information: History of prostate cancer ? ? ?Exam:  ?BP 132/78 (BP Location: Left Arm, Patient Position: Sitting, Cuff Size: Large)   Pulse 60   Ht '6\' 2"'$  (1.88 m)   Wt 273 lb 12.8 oz (124.2 kg)   SpO2 94%   BMI 35.15 kg/m?  ?General: Well Developed, well nourished, and in no acute distress.  ? ?MSK: Right hand: Swelling at third and fourth MCP and PIPs. ?Tender palpation at third and fourth PIP and MCP. ?Decreased flexion at third and fourth PIP and MCP. ?Intact strength. ?Pulses capillary fill and sensation are intact distally. ? ? ? ?Lab and Radiology Results ? ?X-ray images right hand obtained today personally and independently interpreted. ?Old avulsion fragment present at vulvar third PIP.  The avulsion fragment is rounded in appearance indicating that it is probably old. ?Some degenerative changes are present across MCPs PIPs and DIPs.  No acute fractures are visible. ?Await formal radiology review ? ? ? ? ?Assessment and Plan: ?66 y.o. male with right hand pain and swelling after a hand injury.  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  Plan for home exercise program taught in clinic today by myself as well as Voltaren gel and heat.   If this is not sufficient in a few weeks he will let me know and I can refer to hand therapy.  Recommend buddy taping.  Recheck in 6 weeks.   ? ? ?PDMP not reviewed this encounter. ?Orders Placed This Encounter  ?Procedures  ? DG Hand Complete Right  ?  Standing Status:   Future  ?  Number of Occurrences:   1  ?  Standing Expiration Date:   01/18/2022  ?  Order Specific Question:   Reason for Exam (SYMPTOM  OR DIAGNOSIS REQUIRED)  ?  Answer:   R hand pain  ?  Order Specific Question:   Preferred imaging location?  ?  Answer:   [facility]  ? ?No orders of the defined types were placed in this encounter. ? ? ? ?Discussed warning signs or symptoms. Please see discharge instructions. Patient expresses understanding. ? ? ?The above documentation has been reviewed and is accurate and complete Lynne Leader, M.D. ? ? ?

## 2021-12-18 NOTE — Patient Instructions (Addendum)
Good to see you today. ? ?Please use Voltaren gel (Generic Diclofenac Gel) up to 4x daily for pain as needed.  This is available over-the-counter as both the name brand Voltaren gel and the generic diclofenac gel.  ? ?Work on the hand exercises and range of motion but if not improving in the next 2-3 weeks, let me know and I can order hand therapy. ? ?Follow-up: 6 weeks ?

## 2021-12-19 NOTE — Telephone Encounter (Signed)
Pt stated to sent rybelsus  so he can see about pricing  ? ?

## 2021-12-20 MED ORDER — OZEMPIC (0.25 OR 0.5 MG/DOSE) 2 MG/1.5ML ~~LOC~~ SOPN: 0.2500 mg | PEN_INJECTOR | SUBCUTANEOUS | 1 refills | Status: DC

## 2021-12-20 NOTE — Progress Notes (Signed)
Xray of the right hand shows old injuries but no new fracture. Some arthritis changes are present.

## 2021-12-21 NOTE — Telephone Encounter (Signed)
Placed in the front office "patient pick up" file  ?

## 2022-01-16 ENCOUNTER — Ambulatory Visit: Payer: Managed Care, Other (non HMO) | Admitting: Family Medicine

## 2022-01-16 ENCOUNTER — Encounter: Payer: Self-pay | Admitting: Family Medicine

## 2022-01-16 VITALS — BP 136/78 | HR 59 | Temp 97.9°F | Resp 17 | Ht 74.0 in | Wt 277.0 lb

## 2022-01-16 DIAGNOSIS — H9313 Tinnitus, bilateral: Secondary | ICD-10-CM

## 2022-01-16 DIAGNOSIS — R7303 Prediabetes: Secondary | ICD-10-CM

## 2022-01-16 DIAGNOSIS — E669 Obesity, unspecified: Secondary | ICD-10-CM | POA: Diagnosis not present

## 2022-01-16 MED ORDER — WEGOVY 0.25 MG/0.5ML ~~LOC~~ SOAJ: 0.2500 mg | SUBCUTANEOUS | 3 refills | Status: DC

## 2022-01-16 NOTE — Patient Instructions (Addendum)
Here is the number for medical weight loss specialist: ?[facility] ?Medical Weight Loss Management  ?[phone] ? ?I will order Mancel Parsons again to see if covered.  ? ?Continue background noise, try isoflavinoid supplement. I will refer you to ENT. You can have a hearing test prior as that may be helpful. ? ? ?Tinnitus ?Tinnitus refers to hearing a sound when there is no actual source for that sound. This is often described as ringing in the ears. However, people with this condition may hear a variety of noises, in one ear or in both ears. ?The sounds of tinnitus can be soft, loud, or somewhere in between. Tinnitus can last for a few seconds or can be constant for days. It may go away without treatment and come back at various times. When tinnitus is constant or happens often, it can lead to other problems, such as trouble sleeping and trouble concentrating. ?Almost everyone experiences tinnitus at some point. Tinnitus is not the same as hearing loss. Tinnitus that is long-lasting (chronic) or comes back often (recurs) may require medical attention. ?What are the causes? ?The cause of tinnitus is often not known. In some cases, it can result from: ?Exposure to loud noises from machinery, music, or other sources. ?An object (foreign body) stuck in the ear. ?Earwax buildup. ?Drinking alcohol or caffeine. ?Taking certain medicines. ?Age-related hearing loss. ?It may also be caused by medical conditions such as: ?Ear or sinus infections. ?Heart diseases or high blood pressure. ?Allergies. ?M?ni?re's disease. ?Thyroid problems. ?Tumors. ?A weak, bulging blood vessel (aneurysm) near the ear. ?What increases the risk? ?The following factors may make you more likely to develop this condition: ?Exposure to loud noises. ?Age. Tinnitus is more likely in older individuals. ?Using alcohol or tobacco. ?What are the signs or symptoms? ?The main symptom of tinnitus is hearing a sound when there is no source for that  sound. It may sound like: ?Buzzing. ?Sizzling. ?Ringing. ?Blowing air. ?Hissing. ?Whistling. ?Other sounds may include: ?Roaring. ?Running water. ?A musical note. ?Tapping. ?Humming. ?Symptoms may affect only one ear (unilateral) or both ears (bilateral). ?How is this diagnosed? ?Tinnitus is diagnosed based on your symptoms, your medical history, and a physical exam. Your health care provider may do a thorough hearing test (audiologic exam) if your tinnitus: ?Is unilateral. ?Causes hearing difficulties. ?Lasts 6 months or longer. ?You may work with a health care provider who specializes in hearing disorders (audiologist). You may be asked questions about your symptoms and how they affect your daily life. You may have other tests done, such as: ?CT scan. ?MRI. ?An imaging test of how blood flows through your blood vessels (angiogram). ?How is this treated? ?Treating an underlying medical condition can sometimes make tinnitus go away. If your tinnitus continues, other treatments may include: ?Therapy and counseling to help you manage the stress of living with tinnitus. ?Sound generators to mask the tinnitus. These include: ?Tabletop sound machines that play relaxing sounds to help you fall asleep. ?Wearable devices that fit in your ear and play sounds or music. ?Acoustic neural stimulation. This involves using headphones to listen to music that contains an auditory signal. Over time, listening to this signal may change some pathways in your brain and make you less sensitive to tinnitus. This treatment is used for very severe cases when no other treatment is working. ?Using hearing aids or cochlear implants if your tinnitus is related to hearing loss. Hearing aids are worn in the outer ear. Cochlear implants are surgically placed  in the inner ear. ?Follow these instructions at home: ?Managing symptoms ? ?  ? ?When possible, avoid being in loud places and being exposed to loud sounds. ?Wear hearing protection, such as  earplugs, when you are exposed to loud noises. ?Use a white noise machine, a humidifier, or other devices to mask the sound of tinnitus. ?Practice techniques for reducing stress, such as meditation, yoga, or deep breathing. Work with your health care provider if you need help with managing stress. ?Sleep with your head slightly raised. This may reduce the impact of tinnitus. ?General instructions ?Do not use stimulants, such as nicotine, alcohol, or caffeine. Talk with your health care provider about other stimulants to avoid. Stimulants are substances that can make you feel alert and attentive by increasing certain activities in the body (such as heart rate and blood pressure). These substances may make tinnitus worse. ?Take over-the-counter and prescription medicines only as told by your health care provider. ?Try to get plenty of sleep each night. ?Keep all follow-up visits. This is important. ?Contact a health care provider if: ?Your tinnitus continues for 3 weeks or longer without stopping. ?You develop sudden hearing loss. ?Your symptoms get worse or do not get better with home care. ?You feel you are not able to manage the stress of living with tinnitus. ?Get help right away if: ?You develop tinnitus after a head injury. ?You have tinnitus along with any of the following: ?Dizziness. ?Nausea and vomiting. ?Loss of balance. ?Sudden, severe headache. ?Vision changes. ?Facial weakness or weakness of arms or legs. ?These symptoms may represent a serious problem that is an emergency. Do not wait to see if the symptoms will go away. Get medical help right away. Call your local emergency services (911 in the [place] Do not drive yourself to the hospital. ?Summary ?Tinnitus refers to hearing a sound when there is no actual source for that sound. This is often described as ringing in the ears. ?Symptoms may affect only one ear (unilateral) or both ears (bilateral). ?Use a white noise machine, a humidifier, or other  devices to mask the sound of tinnitus. ?Do not use stimulants, such as nicotine, alcohol, or caffeine. These substances may make tinnitus worse. ?This information is not intended to replace advice given to you by your health care provider. Make sure you discuss any questions you have with your health care provider. ?Document Revised: 08/14/2020 Document Reviewed: 08/14/2020 ?Elsevier Patient Education ? [place]. ? ? ? ? ? ?

## 2022-01-16 NOTE — Progress Notes (Signed)
? ?Subjective:  ?Patient ID: Brian Higgins, male    DOB: 1955/10/12  Age: 66 y.o. MRN: [id number] ? ?CC:  ?Chief Complaint  ?Patient presents with  ? Weight Loss  ?  Pt reports needs paper prescription to take with him to try signing up for program for coverage of Ozempic  ? Tinnitus  ?  Pt reports he has had intermittent ringing reports very intense, and has been constant for apx month and half had been occasional previously and has become constant   ? ? ?HPI ?Brian Higgins presents for  ? ?Obesity: ?Discussed February 9.  He has tried various diets.  He is involved in a regular exercise regimen of 5 to 6 days/week including pickleball and tennis up to 6 hours at a time.  3 meals per day with no snacking, low carbs, has tried keto diet in the past with 20 pound weight loss then regained.  Multiple family members with diabetes.  With the difficulty he found in weight loss after diet and exercise approach, recommended GLP-1.  .  ?Unfortunately various prescriptions have not been covered including Wegovy, Ozempic ?He would like to try to get Ozempic covered again, request paper prescription.  ?He does have prediabetes.  ?Gym 4 mornings per week.  ?Lab Results  ?Component Value Date  ? HGBA1C 6.0 08/08/2021  ? ?Wt Readings from Last 3 Encounters:  ?01/16/22 277 lb (125.6 kg)  ?12/18/21 273 lb 12.8 oz (124.2 kg)  ?11/01/21 268 lb 12.8 oz (121.9 kg)  ? ?Tinnitus: ?Off and on starting last fall - after covid. Bilateral.  ?Prior would come and go, now constant past 6 weeks  ?Constant, high pitch, no pulsatile quality.  ?Both ears. Using background noise.  ?No recent use of firearms, prior use of firearms always uses hearing protection.  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  ?No loss of hearing. No headache.  ?No ear pain. No d/c, bleeding.  ?No aspirin.  ?Rare PPI.  ?Viagra once per week.  ?Had been taking melatonin for sleep - better until ringing worsened.  ? ? ?  01/16/2022  ? 11:49 AM 11/01/2021  ?   3:48 PM 09/27/2021  ?  8:45 AM 02/05/2021  ? 10:34 AM 01/27/2020  ? 11:52 AM  ?Depression screen PHQ 2/9  ?Decreased Interest 0 0 0 0 0  ?Down, Depressed, Hopeless 0 0 0 0 0  ?PHQ - 2 Score 0 0 0 0 0  ? ? ? ? ?History ?Patient Active Problem List  ? Diagnosis Date Noted  ? Acute upper respiratory infection 09/11/2021  ? Sore throat 09/11/2021  ? Acute cough 09/11/2021  ? Tibialis posterior tendinitis, right 04/07/2018  ? Leg length discrepancy 04/07/2018  ? OA (osteoarthritis) of knee 01/13/2017  ? Prostate cancer ([place]) 11/21/2012  ? ?Past Medical History:  ?Diagnosis Date  ? Allergy   ? Arthritis   ? Cancer [facility])   ? Phreesia 07/30/2020  ? GERD (gastroesophageal reflux disease)   ? mild no meds  ? Hypertension   ? Prostate cancer ([place])   ? ?Past Surgical History:  ?Procedure Laterality Date  ? HAND TENDON SURGERY    ? Right  arm  ? JOINT REPLACEMENT N/A   ? Phreesia 07/30/2020  ? PROSTATE SURGERY N/A   ? Phreesia 07/30/2020  ? PROSTATECTOMY    ? ROTATOR CUFF REPAIR Bilateral left 2007 and right 09/2009  ? TOTAL KNEE ARTHROPLASTY Left 01/13/2017  ? Procedure: LEFT TOTAL KNEE ARTHROPLASTY;  Surgeon:  Gaynelle Arabian, MD;  Location: [facility];  Service: Orthopedics;  Laterality: Left;  ? ?Allergies  ?Allergen Reactions  ? Other   ?  Catgut suture did not dissolve--causes infection (2007)  ? ?Prior to Admission medications   ?Medication Sig Start Date End Date Taking? Authorizing Provider  ?atorvastatin (LIPITOR) 10 MG tablet Take 1 tablet (10 mg total) by mouth daily. Can start with few day per week dosing, increase as tolerated. 09/27/21  Yes Wendie Agreste, MD  ?cetirizine (ZYRTEC) 10 MG tablet Take 10 mg by mouth daily as needed for allergies (for seasonal allergies (pt. takes in Spring & Fall)).    Yes [provider]  ?hydrochlorothiazide (MICROZIDE) 12.5 MG capsule Take 1 capsule (12.5 mg total) by mouth daily. 09/27/21  Yes Wendie Agreste, MD  ?promethazine-dextromethorphan (PROMETHAZINE-DM) 6.25-15 MG/5ML syrup  Take 2.5-5 mLs by mouth at bedtime as needed for cough. 09/27/21  Yes Wendie Agreste, MD  ?Semaglutide,0.25 or 0.'5MG'$ /DOS, (OZEMPIC, 0.25 OR 0.5 MG/DOSE,) 2 MG/1.5ML SOPN Inject 0.25 mg into the skin once a week. Increase to 0.'5mg'$  after 4 weeks. 12/20/21  Yes Wendie Agreste, MD  ? ?Social History  ? ?Socioeconomic History  ? Marital status: Married  ?  Spouse name: Not on file  ? Number of children: Not on file  ? Years of education: Not on file  ? Highest education level: Not on file  ?Occupational History  ? Not on file  ?Tobacco Use  ? Smoking status: Never  ? Smokeless tobacco: Never  ?Substance and Sexual Activity  ? Alcohol use: Yes  ?  Alcohol/week: 6.0 - 7.0 standard drinks  ?  Types: 4 Standard drinks or equivalent, 2 - 3 Glasses of wine per week  ?  Comment: x a week  ? Drug use: No  ? Sexual activity: Yes  ?Other Topics Concern  ? Not on file  ?Social History Narrative  ? Not on file  ? ?Social Determinants of Health  ? ?Financial Resource Strain: Not on file  ?Food Insecurity: Not on file  ?Transportation Needs: Not on file  ?Physical Activity: Not on file  ?Stress: Not on file  ?Social Connections: Not on file  ?Intimate Partner Violence: Not on file  ? ? ?Review of Systems ?Per HPI.  ? ?Objective:  ? ?Vitals:  ? 01/16/22 1146  ?BP: 136/78  ?Pulse: (!) 59  ?Resp: 17  ?Temp: 97.9 ?F (36.6 ?C)  ?TempSrc: Temporal  ?SpO2: 95%  ?Weight: 277 lb (125.6 kg)  ?Height: '6\' 2"'$  (1.88 m)  ? ? ? ?Physical Exam ?Vitals reviewed.  ?Constitutional:   ?   Appearance: He is well-developed. He is obese.  ?HENT:  ?   Head: Normocephalic and atraumatic.  ?   Right Ear: Tympanic membrane, ear canal and external ear normal. There is no impacted cerumen.  ?   Left Ear: Tympanic membrane, ear canal and external ear normal. There is no impacted cerumen.  ?Neck:  ?   Vascular: No carotid bruit or JVD.  ?Cardiovascular:  ?   Rate and Rhythm: Normal rate and regular rhythm.  ?   Heart sounds: Normal heart sounds. No murmur  heard. ?Pulmonary:  ?   Effort: Pulmonary effort is normal.  ?   Breath sounds: Normal breath sounds. No rales.  ?Musculoskeletal:  ?   Right lower leg: No edema.  ?   Left lower leg: No edema.  ?Skin: ?   General: Skin is warm and dry.  ?Neurological:  ?  Mental Status: He is alert and oriented to person, place, and time.  ?Psychiatric:     ?   Mood and Affect: Mood normal.  ? ? ? ? ? ?Assessment & Plan:  ?Brian Higgins is a 66 y.o. male . ?Obesity (BMI 30.0-34.9) - Plan: Semaglutide-Weight Management (WEGOVY) 0.25 MG/0.5ML SOAJ ?Prediabetes - Plan: Semaglutide-Weight Management (WEGOVY) 0.25 MG/0.5ML SOAJ ? -Unfortunately has failed diet and exercise approach with consistent exercise as above.  Phone number provided for medical weight loss specialist but will try to get [facility] covered again as I think that would be beneficial especially since he has prediabetes.  Prescription printed. ? ?Tinnitus of both ears - Plan: Ambulatory referral to ENT ? -Intermittent since last fall, now increasing in severity and persistent.  No apparent ototoxic medications at this time, or apparent medication cause.  Denies hearing loss.  Ear exam is reassuring.  Trial of ice to flavonoids, plans on hearing screen through [facility], and ENT referral placed.  Background noise/masking discussed.  Handout given. ? ?Meds ordered this encounter  ?Medications  ? Semaglutide-Weight Management (WEGOVY) 0.25 MG/0.5ML SOAJ  ?  Sig: Inject 0.25 mg into the skin once a week.  ?  Dispense:  2 mL  ?  Refill:  3  ? ?Patient Instructions  ?Here is the number for medical weight loss specialist: ?[facility] ?Medical Weight Loss Management  ?[phone] ? ?I will order Mancel Parsons again to see if covered.  ? ?Continue background noise, try isoflavinoid supplement. I will refer you to ENT. You can have a hearing test prior as that may be helpful. ? ? ?Tinnitus ?Tinnitus refers to hearing a sound when there is no actual source for that sound.  This is often described as ringing in the ears. However, people with this condition may hear a variety of noises, in one ear or in both ears. ?The sounds of tinnitus can be soft, loud, or somewhere in betwee

## 2022-01-29 ENCOUNTER — Ambulatory Visit: Payer: Managed Care, Other (non HMO) | Admitting: Family Medicine

## 2022-05-09 DIAGNOSIS — H903 Sensorineural hearing loss, bilateral: Secondary | ICD-10-CM | POA: Insufficient documentation

## 2022-05-09 DIAGNOSIS — H9313 Tinnitus, bilateral: Secondary | ICD-10-CM | POA: Insufficient documentation

## 2022-06-08 ENCOUNTER — Other Ambulatory Visit: Payer: Self-pay | Admitting: Cardiovascular Disease

## 2022-06-08 MED ORDER — MOLNUPIRAVIR 200 MG PO CAPS: 4.0000 | ORAL_CAPSULE | Freq: Two times a day (BID) | ORAL | 0 refills | Status: AC

## 2022-08-14 ENCOUNTER — Other Ambulatory Visit: Payer: Self-pay | Admitting: Family Medicine

## 2022-08-14 DIAGNOSIS — E785 Hyperlipidemia, unspecified: Secondary | ICD-10-CM

## 2022-08-26 ENCOUNTER — Encounter: Payer: Self-pay | Admitting: Family Medicine

## 2022-08-26 ENCOUNTER — Telehealth (INDEPENDENT_AMBULATORY_CARE_PROVIDER_SITE_OTHER): Payer: Managed Care, Other (non HMO) | Admitting: Family Medicine

## 2022-08-26 VITALS — Ht 74.0 in | Wt 243.0 lb

## 2022-08-26 DIAGNOSIS — J22 Unspecified acute lower respiratory infection: Secondary | ICD-10-CM | POA: Diagnosis not present

## 2022-08-26 DIAGNOSIS — R052 Subacute cough: Secondary | ICD-10-CM | POA: Diagnosis not present

## 2022-08-26 MED ORDER — BENZONATATE 100 MG PO CAPS: 100.0000 mg | ORAL_CAPSULE | Freq: Three times a day (TID) | ORAL | 0 refills | Status: DC | PRN

## 2022-08-26 MED ORDER — HYDROCODONE BIT-HOMATROP MBR 5-1.5 MG/5ML PO SOLN: 5.0000 mL | Freq: Three times a day (TID) | ORAL | 0 refills | Status: DC | PRN

## 2022-08-26 MED ORDER — AZITHROMYCIN 250 MG PO TABS: ORAL_TABLET | ORAL | 0 refills | Status: AC

## 2022-08-26 NOTE — Patient Instructions (Addendum)
Start azithromycin for cough, congestion.  Tessalon perles during day, hydrocodone cough syrup at bedtime if needed.  Follow-up if not improving within the next week, sooner if worsening.  Lets follow-up in the next few weeks for updated labs and discuss weight management options and medications further.  Take care.

## 2022-08-26 NOTE — Progress Notes (Signed)
Virtual Visit via Video Note  I connected with Brian Higgins on 08/26/22 at 12:30 PM by a video enabled telemedicine application and verified that I am speaking with the correct person using two identifiers.  Patient location: home  - by self.  My location: office - [place].    I discussed the limitations, risks, security and privacy concerns of performing an evaluation and management service by telephone and the availability of in person appointments. I also discussed with the patient that there may be a patient responsible charge related to this service. The patient expressed understanding and agreed to proceed, consent obtained  Chief complaint:  Chief Complaint  Patient presents with   Cough    Pt states he has a cough and congestion for 2 weeks, tried mucinex and nyquil and nothing is working, pt wakes up in the middle of the night coughing and can't go back to sleep     History of Present Illness: Brian Higgins is a 66 y.o. male  Cough, congestion Past 2 weeks.  Cough wakes up in the [place], some coughing fits. Over 2 weeks now. Attempted treatments of Mucinex, NyQuil. Worse at night.  No wheeze/shortness of breath.  No chest pain, no new leg swelling.  No fever.  No covid testing (had early October). Some yellow phlegm in am - persistent. Otherwise feels ok. Some fatigue with decreased sleep.  Controlled substance database (PDMP) reviewed. No concerns appreciated.      Patient Active Problem List   Diagnosis Date Noted   Acute upper respiratory infection 09/11/2021   Sore throat 09/11/2021   Acute cough 09/11/2021   Tibialis posterior tendinitis, right 04/07/2018   Leg length discrepancy 04/07/2018   OA (osteoarthritis) of knee 01/13/2017   Prostate cancer ([place]) 11/21/2012   Past Medical History:  Diagnosis Date   Allergy    Arthritis    Cancer ([place])    Phreesia 07/30/2020   GERD (gastroesophageal reflux disease)    mild no meds   Hypertension     Prostate cancer [facility])    Past Surgical History:  Procedure Laterality Date   HAND TENDON SURGERY     Right  arm   JOINT REPLACEMENT N/A    Phreesia 07/30/2020   PROSTATE SURGERY N/A    Phreesia 07/30/2020   PROSTATECTOMY     ROTATOR CUFF REPAIR Bilateral left 2007 and right 09/2009   TOTAL KNEE ARTHROPLASTY Left 01/13/2017   Procedure: LEFT TOTAL KNEE ARTHROPLASTY;  Surgeon: Gaynelle Arabian, MD;  [facility];  Service: Orthopedics;  Laterality: Left;   Allergies  Allergen Reactions   Other     Catgut suture did not dissolve--causes infection (2007)   Prior to Admission medications   Medication Sig Start Date End Date Taking? Authorizing Provider  atorvastatin (LIPITOR) 10 MG tablet TAKE 1 TABLET BY MOUTH DAILY. CAN START WITH FEW DAY PER WEEK DOSING, INCREASE AS TOLERATED. 08/14/22  Yes Wendie Agreste, MD  cetirizine (ZYRTEC) 10 MG tablet Take 10 mg by mouth daily as needed for allergies (for seasonal allergies (pt. takes in Spring & Fall)).    Yes [provider]  hydrochlorothiazide (MICROZIDE) 12.5 MG capsule Take 1 capsule (12.5 mg total) by mouth daily. 09/27/21  Yes Wendie Agreste, MD  hydrochlorothiazide (MICROZIDE) 12.5 MG capsule Take 1 capsule by mouth daily.    [provider]  Semaglutide-Weight Management (WEGOVY) 0.25 MG/0.5ML SOAJ Inject 0.25 mg into the skin once a week. Patient not taking: Reported on 08/26/2022 01/16/22  Wendie Agreste, MD   Social History   Socioeconomic History   Marital status: Married    Spouse name: Not on file   Number of children: Not on file   Years of education: Not on file   Highest education level: Not on file  Occupational History   Not on file  Tobacco Use   Smoking status: Never   Smokeless tobacco: Never  Substance and Sexual Activity   Alcohol use: Yes    Alcohol/week: 6.0 - 7.0 standard drinks of alcohol    Types: 4 Standard drinks or equivalent, 2 - 3 Glasses of wine per week    Comment: x a  week   Drug use: No   Sexual activity: Yes  Other Topics Concern   Not on file  Social History Narrative   Not on file   Social Determinants of Health   Financial Resource Strain: Not on file  Food Insecurity: Not on file  Transportation Needs: Not on file  Physical Activity: Not on file  Stress: Not on file  Social Connections: Not on file  Intimate Partner Violence: Not on file    Observations/Objective: Vitals:   08/26/22 1203  Weight: 243 lb (110.2 kg)  Height: '6\' 2"'$  (1.88 m)  Nontoxic appearance on video.  Speaking in full sentences, no respiratory distress.  No audible stridor or wheeze.  Euthymic mood, appropriate responses, all questions answered with understanding of plan expressed.   Assessment and Plan: Subacute cough - Plan: azithromycin (ZITHROMAX) 250 MG tablet, benzonatate (TESSALON) 100 MG capsule, HYDROcodone bit-homatropine (HYCODAN) 5-1.5 MG/5ML syrup  LRTI (lower respiratory tract infection) - Plan: azithromycin (ZITHROMAX) 250 MG tablet, benzonatate (TESSALON) 100 MG capsule, HYDROcodone bit-homatropine (HYCODAN) 5-1.5 MG/5ML syrup Persistent cough with discolored phlegm, lower respiratory tract infection/bronchitis likely after initial viral illness.  Minimal change with treatment as above.  Tessalon Perles for daytime cough, hydrocodone cough syrup at night for now with potential side effects and risk discussed.  Start azithromycin.  RTC precautions.  Plan is to follow-up to discuss weight management medication and updated labs in the next few weeks.  Follow Up Instructions:    I discussed the assessment and treatment plan with the patient. The patient was provided an opportunity to ask questions and all were answered. The patient agreed with the plan and demonstrated an understanding of the instructions.   The patient was advised to call back or seek an in-person evaluation if the symptoms worsen or if the condition fails to improve as  anticipated.   Wendie Agreste, MD

## 2022-09-18 ENCOUNTER — Encounter: Payer: Self-pay | Admitting: Family Medicine

## 2022-09-18 ENCOUNTER — Ambulatory Visit (INDEPENDENT_AMBULATORY_CARE_PROVIDER_SITE_OTHER)
Admission: RE | Admit: 2022-09-18 | Discharge: 2022-09-18 | Disposition: A | Discharge status: Home / Self Care | Payer: Managed Care, Other (non HMO) | Source: Ambulatory Visit | Attending: Family Medicine | Admitting: Family Medicine

## 2022-09-18 ENCOUNTER — Ambulatory Visit: Payer: Managed Care, Other (non HMO) | Admitting: Family Medicine

## 2022-09-18 VITALS — BP 136/80 | HR 53 | Temp 97.7°F | Ht 74.0 in | Wt 253.0 lb

## 2022-09-18 DIAGNOSIS — Z23 Encounter for immunization: Secondary | ICD-10-CM

## 2022-09-18 DIAGNOSIS — E669 Obesity, unspecified: Secondary | ICD-10-CM

## 2022-09-18 DIAGNOSIS — R7303 Prediabetes: Secondary | ICD-10-CM

## 2022-09-18 DIAGNOSIS — M79671 Pain in right foot: Secondary | ICD-10-CM

## 2022-09-18 DIAGNOSIS — E785 Hyperlipidemia, unspecified: Secondary | ICD-10-CM | POA: Diagnosis not present

## 2022-09-18 DIAGNOSIS — I10 Essential (primary) hypertension: Secondary | ICD-10-CM | POA: Diagnosis not present

## 2022-09-18 DIAGNOSIS — R0683 Snoring: Secondary | ICD-10-CM

## 2022-09-18 DIAGNOSIS — G4700 Insomnia, unspecified: Secondary | ICD-10-CM

## 2022-09-18 LAB — COMPREHENSIVE METABOLIC PANEL
ALT: 30 U/L (ref 0–53)
AST: 22 U/L (ref 0–37)
Albumin: 4.7 g/dL (ref 3.5–5.2)
Alkaline Phosphatase: 51 U/L (ref 39–117)
BUN: 16 mg/dL (ref 6–23)
CO2: 28 mEq/L (ref 19–32)
Calcium: 9.6 mg/dL (ref 8.4–10.5)
Chloride: 105 mEq/L (ref 96–112)
Creatinine, Ser: 0.74 mg/dL (ref 0.40–1.50)
GFR: 94.74 mL/min (ref >60.00)
Glucose, Bld: 94 mg/dL (ref 70–99)
Potassium: 4.1 mEq/L (ref 3.5–5.1)
Sodium: 140 mEq/L (ref 135–145)
Total Bilirubin: 1 mg/dL (ref 0.2–1.2)
Total Protein: 7.3 g/dL (ref 6.0–8.3)

## 2022-09-18 LAB — LIPID PANEL
Cholesterol: 142 mg/dL (ref 0–200)
HDL: 50.4 mg/dL (ref >39.00)
LDL Cholesterol: 72 mg/dL (ref 0–99)
NonHDL: 91.5
Total CHOL/HDL Ratio: 3
Triglycerides: 100 mg/dL (ref 0.0–149.0)
VLDL: 20 mg/dL (ref 0.0–40.0)

## 2022-09-18 LAB — HEMOGLOBIN A1C: Hgb A1c MFr Bld: 5.6 % (ref 4.6–6.5)

## 2022-09-18 MED ORDER — ZEPBOUND 2.5 MG/0.5ML ~~LOC~~ SOAJ: 2.5000 mg | SUBCUTANEOUS | 1 refills | Status: DC

## 2022-09-18 NOTE — Progress Notes (Signed)
Subjective:  Patient ID: Brian Higgins, male    DOB: May 11, 1956  Age: 66 y.o. MRN: [id number]  CC:  Chief Complaint  Patient presents with   Weight Loss    Pt wants to talk about weight loss medication Pt is fasting    foot pain    Pt states his right foot has been hurting    Insomnia    Pt states he has been unable to sleep longer than 4 hours     HPI Brian Higgins presents for  Concerns above  Obesity Discussed earlier this year, has tried various diets, frequent exercise regimen, keto diet, multiple family members with diabetes.  Multiple attempts at various prescriptions for weight loss including Wegovy, Ozempic.  Prior authorizations have been completed, does have prediabetes, but unfortunately medications were not covered. Took Wegovy 0.'5mg'$  with B12 supplement - form company called [employer] - out of pocket. On meds for 4 months, 30# wt loss. Stopped about a month ago - company ran out of meds. Would like to try ZepBound. No FH of MEN syndrome or thyroid CA. No n/v/abd pain on wegovy.  Flu vaccine today.  Wt Readings from Last 3 Encounters:  09/18/22 253 lb (114.8 kg)  08/26/22 243 lb (110.2 kg)  01/16/22 277 lb (125.6 kg)   Lab Results  Component Value Date   HGBA1C 6.0 08/08/2021   Right foot pain Injured few months ago hiking - inverted. Some bruising on top and outside of foot - stayed off for awhile, then used wrap/compression. No meds. Better until skiing last week for 4 days and hiking few days ago. No prior R foot fx. Pain at outside of foot, not ankle.   Insomnia Difficulty with staying asleep. Awake after 4 hours. Not due to nocturia. Some thoughts of the day and ringing in ears more intense at night. Has headphones with masking noise. Borderline hearing loss - option of hearing aid, but borderline. No PND, does have snoring, some possible pauses.  Tx: melatonin gummies   Hyperlipidemia: Lipitor '10mg'$  qd. No new myalgias.  Lab Results  Component Value  Date   CHOL 157 09/27/2021   HDL 45.70 09/27/2021   LDLCALC 82 09/27/2021   LDLDIRECT 125.0 08/08/2021   TRIG 148.0 09/27/2021   CHOLHDL 3 09/27/2021   Lab Results  Component Value Date   ALT 31 09/27/2021   AST 21 09/27/2021   ALKPHOS 40 09/27/2021   BILITOT 1.1 09/27/2021    Hypertension: Hctz 12.'5mg'$  qd. No new side effects.  Home readings: 130/70 range.  BP Readings from Last 3 Encounters:  09/18/22 136/80  01/16/22 136/78  12/18/21 132/78   Lab Results  Component Value Date   CREATININE 0.86 09/27/2021       History Patient Active Problem List   Diagnosis Date Noted   Acute cough 09/11/2021   Tibialis posterior tendinitis, right 04/07/2018   Leg length discrepancy 04/07/2018   OA (osteoarthritis) of knee 01/13/2017   Prostate cancer ([place]) 11/21/2012   Past Medical History:  Diagnosis Date   Allergy    Arthritis    Cancer ([place])    Phreesia 07/30/2020   GERD (gastroesophageal reflux disease)    mild no meds   Hypertension    Prostate cancer [facility])    Past Surgical History:  Procedure Laterality Date   HAND TENDON SURGERY     Right  arm   JOINT REPLACEMENT N/A    Phreesia 07/30/2020   PROSTATE SURGERY N/A    Phreesia 07/30/2020  PROSTATECTOMY     ROTATOR CUFF REPAIR Bilateral left 2007 and right 09/2009   TOTAL KNEE ARTHROPLASTY Left 01/13/2017   Procedure: LEFT TOTAL KNEE ARTHROPLASTY;  Surgeon: Gaynelle Arabian, MD;  [facility];  Service: Orthopedics;  Laterality: Left;   Allergies  Allergen Reactions   Other     Catgut suture did not dissolve--causes infection (2007)   Prior to Admission medications   Medication Sig Start Date End Date Taking? Authorizing Provider  atorvastatin (LIPITOR) 10 MG tablet TAKE 1 TABLET BY MOUTH DAILY. CAN START WITH FEW DAY PER WEEK DOSING, INCREASE AS TOLERATED. 08/14/22  Yes Wendie Agreste, MD  hydrochlorothiazide (MICROZIDE) 12.5 MG capsule Take 1 capsule by mouth daily.   Yes [provider]   benzonatate (TESSALON) 100 MG capsule Take 1 capsule (100 mg total) by mouth 3 (three) times daily as needed for cough. Patient not taking: Reported on 09/18/2022 08/26/22   Wendie Agreste, MD  cetirizine (ZYRTEC) 10 MG tablet Take 10 mg by mouth daily as needed for allergies (for seasonal allergies (pt. takes in Spring & Fall)).  Patient not taking: Reported on 09/18/2022    [provider]  hydrochlorothiazide (MICROZIDE) 12.5 MG capsule Take 1 capsule (12.5 mg total) by mouth daily. 09/27/21   Wendie Agreste, MD   Social History   Socioeconomic History   Marital status: Married    Spouse name: Not on file   Number of children: Not on file   Years of education: Not on file   Highest education level: Not on file  Occupational History   Not on file  Tobacco Use   Smoking status: Never   Smokeless tobacco: Never  Substance and Sexual Activity   Alcohol use: Yes    Alcohol/week: 6.0 - 7.0 standard drinks of alcohol    Types: 4 Standard drinks or equivalent, 2 - 3 Glasses of wine per week    Comment: x a week   Drug use: No   Sexual activity: Yes  Other Topics Concern   Not on file  Social History Narrative   Not on file   Social Determinants of Health   Financial Resource Strain: Not on file  Food Insecurity: Not on file  Transportation Needs: Not on file  Physical Activity: Not on file  Stress: Not on file  Social Connections: Not on file  Intimate Partner Violence: Not on file    Review of Systems  Constitutional:  Negative for fatigue and unexpected weight change.  Eyes:  Negative for visual disturbance.  Respiratory:  Negative for cough, chest tightness and shortness of breath.   Cardiovascular:  Negative for chest pain, palpitations and leg swelling.  Gastrointestinal:  Negative for abdominal pain and blood in stool.  Neurological:  Negative for dizziness, light-headedness and headaches.     Objective:   Vitals:   09/18/22 1055 09/18/22 1127   BP: (!) 140/70 136/80  Pulse: (!) 53   Temp: 97.7 F (36.5 C)   SpO2: 96%   Weight: 253 lb (114.8 kg)   Height: '6\' 2"'$  (1.88 m)      Physical Exam Vitals reviewed.  Constitutional:      Appearance: He is well-developed.  HENT:     Head: Normocephalic and atraumatic.  Neck:     Vascular: No carotid bruit or JVD.  Cardiovascular:     Rate and Rhythm: Normal rate and regular rhythm.     Heart sounds: Normal heart sounds. No murmur heard. Pulmonary:  Effort: Pulmonary effort is normal.     Breath sounds: Normal breath sounds. No rales.  Musculoskeletal:     Right lower leg: No edema.     Left lower leg: No edema.     Comments: Right ankle, pain-free range of motion, no malleoli or tenderness.  Right foot navicular nontender, bony prominence with tender to palpation over the proximal fifth metatarsal.  Skin intact, no ecchymosis.  Remainder of foot nontender.  Neurovascular intact distally.  Skin:    General: Skin is warm and dry.  Neurological:     Mental Status: He is alert and oriented to person, place, and time.  Psychiatric:        Mood and Affect: Mood normal.        Assessment & Plan:  Ory Elting is a 66 y.o. male . Prediabetes - Plan: Hemoglobin A1c, tirzepatide (ZEPBOUND) 2.5 MG/0.5ML Pen Obesity (BMI 30.0-34.9) - Plan: tirzepatide (ZEPBOUND) 2.5 MG/0.5ML Pen   -Trial of zepbound.  Potential side effects discussed, recheck in 1 month if covered.  Need for influenza vaccination - Plan: Flu Vaccine QUAD High Dose(Fluad)  Essential hypertension - Plan: Comprehensive metabolic panel  -  Stable, tolerating current regimen. Labs pending as above.   Hyperlipidemia, unspecified hyperlipidemia type - Plan: Comprehensive metabolic panel, Lipid panel  -  Stable, tolerating current regimen. Labs pending as above.   Insomnia, unspecified type - Plan: Ambulatory referral to Sleep Studies Snoring - Plan: Ambulatory referral to Sleep Studies  -Initially referred to  sleep specialist to rule out sleep apnea.  Underlying tinnitus also likely contributing.  Continue masking noise.  Right foot pain - Plan: DG Foot Complete Right Injury as above with some initial improvement then return of discomfort laterally.  Check imaging as area of discomfort at the proximal fifth metatarsal.  Possible Ortho eval depending on results.  Meds ordered this encounter  Medications   tirzepatide (ZEPBOUND) 2.5 MG/0.5ML Pen    Sig: Inject 2.5 mg into the skin once a week.    Dispense:  2 mL    Refill:  1   Patient Instructions  I will refer you to sleep specialist.  Zepbound ordered. Recheck in 1 month for possible higher dose.  No med changes for now, I will check labs and let you know if there are concerns I am suspicious of 1/5 metatarsal or outside foot bone fracture from a few months ago with recent flare and some healing.  Please have x-ray performed at location below and once I see those results can discuss next step. Thanks for coming in today.   [place] [facility] for xray.  Walk in 8:30-4:30 during weekdays, no appointment needed [place].  [place],  [id number]     Signed,   [name], MD [place] 09/18/22 11:29 AM

## 2022-09-18 NOTE — Patient Instructions (Addendum)
I will refer you to sleep specialist.  Zepbound ordered. Recheck in 1 month for possible higher dose.  No med changes for now, I will check labs and let you know if there are concerns I am suspicious of 1/5 metatarsal or outside foot bone fracture from a few months ago with recent flare and some healing.  Please have x-ray performed at location below and once I see those results can discuss next step. Thanks for coming in today.   [place] for xray.  Walk in 8:30-4:30 during weekdays, no appointment needed [place].  [place], [place] [id number]

## 2022-09-19 ENCOUNTER — Encounter: Payer: Self-pay | Admitting: Family Medicine

## 2022-09-20 NOTE — Telephone Encounter (Signed)
Pt is asking about coming off statin or reducing the doses per week considering improved cholesterol numbers

## 2022-09-21 ENCOUNTER — Encounter: Payer: Self-pay | Admitting: Family Medicine

## 2022-10-01 ENCOUNTER — Other Ambulatory Visit: Payer: Self-pay | Admitting: Family Medicine

## 2022-10-01 DIAGNOSIS — R052 Subacute cough: Secondary | ICD-10-CM

## 2022-10-01 DIAGNOSIS — J22 Unspecified acute lower respiratory infection: Secondary | ICD-10-CM

## 2022-10-17 ENCOUNTER — Ambulatory Visit: Payer: Managed Care, Other (non HMO) | Admitting: Family Medicine

## 2022-10-24 ENCOUNTER — Encounter: Payer: Self-pay | Admitting: Family Medicine

## 2022-10-24 ENCOUNTER — Ambulatory Visit (INDEPENDENT_AMBULATORY_CARE_PROVIDER_SITE_OTHER): Payer: Managed Care, Other (non HMO) | Admitting: Family Medicine

## 2022-10-24 ENCOUNTER — Other Ambulatory Visit: Payer: Self-pay | Admitting: Family Medicine

## 2022-10-24 VITALS — BP 130/76 | HR 58 | Temp 97.6°F | Ht 74.0 in | Wt 254.4 lb

## 2022-10-24 DIAGNOSIS — E669 Obesity, unspecified: Secondary | ICD-10-CM

## 2022-10-24 MED ORDER — ZEPBOUND 5 MG/0.5ML ~~LOC~~ SOAJ: 5.0000 mg | SUBCUTANEOUS | 1 refills | Status: DC

## 2022-10-24 NOTE — Progress Notes (Signed)
Subjective:  Patient ID: Brian Higgins, male    DOB: 1955/11/01  Age: 67 y.o. MRN: [id number]  CC:  Chief Complaint  Patient presents with   Weight Loss    Pt notes no side effects has only been taking it a few weeks due to authorization     HPI Brian Higgins presents for  Obesity with prediabetes Had tolerated Wegovy in the past, (up to '1mg'$ ) but prior company ran out of supply.  Approximately 30 pound weight loss on that medication.  We last discussed meds 09/18/2022.  Started on Zepbound.  Tolerating current dose, no nausea or vomiting, no abdominal pain.  No new side effects.  Weight similar to December visit.  Has been on medication just past 2 weeks. Less appetite suppression than Wegovy.  Rescheduled sleep study for February.   Wt Readings from Last 3 Encounters:  10/24/22 254 lb 6.4 oz (115.4 kg)  09/18/22 253 lb (114.8 kg)  08/26/22 243 lb (110.2 kg)   Lab Results  Component Value Date   HGBA1C 5.6 09/18/2022      History Patient Active Problem List   Diagnosis Date Noted   Acute cough 09/11/2021   Tibialis posterior tendinitis, right 04/07/2018   Leg length discrepancy 04/07/2018   OA (osteoarthritis) of knee 01/13/2017   Prostate cancer ([place]) 11/21/2012   Past Medical History:  Diagnosis Date   Allergy    Arthritis    Cancer ([place])    Phreesia 07/30/2020   GERD (gastroesophageal reflux disease)    mild no meds   Hypertension    Prostate cancer [facility])    Past Surgical History:  Procedure Laterality Date   HAND TENDON SURGERY     Right  arm   JOINT REPLACEMENT N/A    Phreesia 07/30/2020   PROSTATE SURGERY N/A    Phreesia 07/30/2020   PROSTATECTOMY     ROTATOR CUFF REPAIR Bilateral left 2007 and right 09/2009   TOTAL KNEE ARTHROPLASTY Left 01/13/2017   Procedure: LEFT TOTAL KNEE ARTHROPLASTY;  Surgeon: Gaynelle Arabian, MD;  Location: [facility];  Service: Orthopedics;  Laterality: Left;   Allergies  Allergen Reactions   Other     Catgut suture did not  dissolve--causes infection (2007)   Prior to Admission medications   Medication Sig Start Date End Date Taking? Authorizing Provider  atorvastatin (LIPITOR) 10 MG tablet TAKE 1 TABLET BY MOUTH DAILY. CAN START WITH FEW DAY PER WEEK DOSING, INCREASE AS TOLERATED. 08/14/22  Yes Wendie Agreste, MD  hydrochlorothiazide (MICROZIDE) 12.5 MG capsule Take 1 capsule by mouth daily.   Yes [provider]  tirzepatide (ZEPBOUND) 2.5 MG/0.5ML Pen Inject 2.5 mg into the skin once a week. 09/18/22  Yes Wendie Agreste, MD  hydrochlorothiazide (MICROZIDE) 12.5 MG capsule Take 1 capsule (12.5 mg total) by mouth daily. 09/27/21   Wendie Agreste, MD   Social History   Socioeconomic History   Marital status: Married    Spouse name: Not on file   Number of children: Not on file   Years of education: Not on file   Highest education level: Not on file  Occupational History   Not on file  Tobacco Use   Smoking status: Never   Smokeless tobacco: Never  Substance and Sexual Activity   Alcohol use: Yes    Alcohol/week: 6.0 - 7.0 standard drinks of alcohol    Types: 4 Standard drinks or equivalent, 2 - 3 Glasses of wine per week    Comment: x  a week   Drug use: No   Sexual activity: Yes  Other Topics Concern   Not on file  Social History Narrative   Not on file   Social Determinants of Health   Financial Resource Strain: Not on file  Food Insecurity: Not on file  Transportation Needs: Not on file  Physical Activity: Not on file  Stress: Not on file  Social Connections: Not on file  Intimate Partner Violence: Not on file    Review of Systems Per HPI.   Objective:   Vitals:   10/24/22 1148  BP: 130/76  Pulse: (!) 58  Temp: 97.6 F (36.4 C)  TempSrc: Oral  SpO2: 93%  Weight: 254 lb 6.4 oz (115.4 kg)  Height: '6\' 2"'$  (1.88 m)     Physical Exam Constitutional:      General: He is not in acute distress.    Appearance: Normal appearance. He is well-developed.  HENT:      Head: Normocephalic and atraumatic.  Cardiovascular:     Rate and Rhythm: Normal rate.  Pulmonary:     Effort: Pulmonary effort is normal.  Neurological:     Mental Status: He is alert and oriented to person, place, and time.  Psychiatric:        Mood and Affect: Mood normal.        Assessment & Plan:  Brian Higgins is a 67 y.o. male . Obesity (BMI 30.0-34.9) Tolerating current dose of tirzepatide, only on the past few weeks.  Minimal weight change, but low-dose compared to previous regimen.  Anticipate he will be able to increase to the higher dose in the next few weeks if still tolerated, new dose ordered.  Update on tolerability in the next month, 58-monthfollow-up.  RTC precautions.  Meds ordered this encounter  Medications   tirzepatide (ZEPBOUND) 5 MG/0.5ML Pen    Sig: Inject 5 mg into the skin once a week.    Dispense:  2 mL    Refill:  1   Patient Instructions  As long as you are still doing well in 2 weeks, can increase dose of zepbound. Update me in next 4-5 weeks, and if needed can try higher dose at that time. Follow up with me in 2 months.     Signed,   JMerri Ray MD [facility] 10/25/22 2:09 PM

## 2022-10-24 NOTE — Patient Instructions (Signed)
As long as you are still doing well in 2 weeks, can increase dose of zepbound. Update me in next 4-5 weeks, and if needed can try higher dose at that time. Follow up with me in 2 months.

## 2022-10-25 ENCOUNTER — Encounter: Payer: Self-pay | Admitting: Family Medicine

## 2022-12-02 ENCOUNTER — Other Ambulatory Visit: Payer: Self-pay | Admitting: Family Medicine

## 2022-12-02 DIAGNOSIS — I10 Essential (primary) hypertension: Secondary | ICD-10-CM

## 2022-12-04 ENCOUNTER — Encounter: Payer: Self-pay | Admitting: Family Medicine

## 2022-12-04 DIAGNOSIS — R7303 Prediabetes: Secondary | ICD-10-CM

## 2022-12-04 DIAGNOSIS — E669 Obesity, unspecified: Secondary | ICD-10-CM

## 2022-12-04 MED ORDER — ZEPBOUND 7.5 MG/0.5ML ~~LOC~~ SOAJ: 7.5000 mg | SUBCUTANEOUS | 0 refills | Status: DC

## 2022-12-04 NOTE — Telephone Encounter (Signed)
Pt is requesting next dose of zepbound will need it for Saturdays injection

## 2022-12-05 ENCOUNTER — Other Ambulatory Visit: Payer: Self-pay | Admitting: Family Medicine

## 2022-12-05 ENCOUNTER — Other Ambulatory Visit: Payer: Self-pay

## 2022-12-05 DIAGNOSIS — E669 Obesity, unspecified: Secondary | ICD-10-CM

## 2022-12-05 DIAGNOSIS — R7303 Prediabetes: Secondary | ICD-10-CM

## 2022-12-06 ENCOUNTER — Other Ambulatory Visit: Payer: Self-pay

## 2022-12-06 ENCOUNTER — Other Ambulatory Visit: Payer: Self-pay | Admitting: Family Medicine

## 2022-12-06 ENCOUNTER — Other Ambulatory Visit (HOSPITAL_BASED_OUTPATIENT_CLINIC_OR_DEPARTMENT_OTHER): Payer: Self-pay

## 2022-12-06 DIAGNOSIS — R7303 Prediabetes: Secondary | ICD-10-CM

## 2022-12-06 DIAGNOSIS — E669 Obesity, unspecified: Secondary | ICD-10-CM

## 2022-12-06 MED ORDER — ZEPBOUND 7.5 MG/0.5ML ~~LOC~~ SOAJ
7.5000 mg | SUBCUTANEOUS | 0 refills | Status: DC
  Filled 2022-12-06 – 2022-12-12 (×3): qty 2, 28d supply, fill #0

## 2022-12-07 ENCOUNTER — Other Ambulatory Visit (HOSPITAL_BASED_OUTPATIENT_CLINIC_OR_DEPARTMENT_OTHER): Payer: Self-pay

## 2022-12-09 ENCOUNTER — Other Ambulatory Visit (HOSPITAL_BASED_OUTPATIENT_CLINIC_OR_DEPARTMENT_OTHER): Payer: Self-pay

## 2022-12-09 ENCOUNTER — Encounter (HOSPITAL_BASED_OUTPATIENT_CLINIC_OR_DEPARTMENT_OTHER): Payer: Self-pay

## 2022-12-12 ENCOUNTER — Other Ambulatory Visit (HOSPITAL_BASED_OUTPATIENT_CLINIC_OR_DEPARTMENT_OTHER): Payer: Self-pay

## 2022-12-23 ENCOUNTER — Ambulatory Visit (INDEPENDENT_AMBULATORY_CARE_PROVIDER_SITE_OTHER): Payer: Managed Care, Other (non HMO) | Admitting: Neurology

## 2022-12-23 ENCOUNTER — Encounter: Payer: Self-pay | Admitting: Neurology

## 2022-12-23 VITALS — BP 141/83 | HR 58 | Ht 74.0 in | Wt 255.6 lb

## 2022-12-23 DIAGNOSIS — H9313 Tinnitus, bilateral: Secondary | ICD-10-CM

## 2022-12-23 DIAGNOSIS — F518 Other sleep disorders not due to a substance or known physiological condition: Secondary | ICD-10-CM

## 2022-12-23 DIAGNOSIS — R0683 Snoring: Secondary | ICD-10-CM | POA: Diagnosis not present

## 2022-12-23 DIAGNOSIS — Z6832 Body mass index (BMI) 32.0-32.9, adult: Secondary | ICD-10-CM

## 2022-12-23 DIAGNOSIS — H903 Sensorineural hearing loss, bilateral: Secondary | ICD-10-CM

## 2022-12-23 DIAGNOSIS — E6609 Other obesity due to excess calories: Secondary | ICD-10-CM

## 2022-12-23 NOTE — Patient Instructions (Signed)

## 2022-12-23 NOTE — Progress Notes (Signed)
SLEEP MEDICINE CLINIC    Provider:  Larey Seat, MD  Primary Care Physician:  Wendie Agreste, MD [address] [place] [address]     Referring Provider: Wendie Agreste, Md [address] [place],  [place] [address]          Chief Complaint according to patient   Patient presents with:     New Patient (Initial Visit)     Referral by PCP.       HISTORY OF PRESENT ILLNESS:  Brian Higgins is a 67 y.o. male patient who is seen upon referral on 12/23/2022 from Dr Carlota Raspberry for a Sleep consultation. Chief concern according to patient :  I lived a long time on 5.5 hours of sleep. I  feel refreshed by a power nap, I am snoring and when I wake up I have trouble going back to sleep due to a roaring tinnitus dominantly in the left ear, usually around 4 AM.    I have the pleasure of seeing Brian Higgins  on 12/23/22 a right -handed male.   12-23-2022:  No previous sleep study, seasonal allergies. Zyrtec in seasonal Prn. Tinnitus was new onset with COVID 19 infection in 06-2022.  Sleep relevant medical history: Nocturia may be once- no Sleep walking, no Tonsillectomy, no cervical spine surgery. No ENT.    Family medical /sleep history: son and sister on CPAP with OSA, both had also insomnia. Lost his eldest son in March 15 years ago at age 47. He unexplainably died in his sleep.    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 2  grandchildren.  The patient currently works full time. Daytime.  Tobacco use: none .  ETOH use ; 3-4 glasses a week,  Caffeine intake in form of Coffee( 4 cups a day , morning and after lunch more hot tea) Soda( /).  No energy drinks Exercise in form of Tennis twice weekly, walking 2-3 times a week- 4 miles average. Gym in Winter.    Sleep habits are as follows: The  patient's dinner time is between 7.30-8 PM. The patient goes to bed at 11 PM and continues to sleep for 5 hours,  reads in bed - asleep in 20 minutes. Avoids screen exposure.  Bedroom is cool, quiet and dark. White noise.  He wakes for one time and uses this for a 1 time bathroom break   The preferred sleep position is laterally, with the support of 1 pillow, flat bed .  Dreams are reportedly rare.  The patient wakes up spontaneously. 4.30-5  AM is the usual rise time. He/reports not always feeling refreshed or restored in AM, with symptoms such as dry mouth, morning tinnitus , no headaches, and residual fatigue. Naps are taken frequently, lasting from 20 to 30 minutes and are more refreshing than nocturnal sleep.    Review of Systems:  Out of a complete 14 system review, the patient complains of only the following symptoms, and all other reviewed systems are negative.:  Fatigue, sleepiness , snoring, fragmented sleep, Insomnia,early morning riser- but now with Tinnitus hindering him to go back to sleep. Open mouth, dry mouth.    How likely are you to doze in the following situations: 0 = not likely, 1 =  slight chance, 2 = moderate chance, 3 = high chance   Sitting and Reading? Watching Television? Sitting inactive in a public place (theater or meeting)? As a passenger in a car for an hour without a break? Lying down in the afternoon when circumstances permit? Sitting and talking to someone? Sitting quietly after lunch without alcohol? In a car, while stopped for a few minutes in traffic?   Total = 5-10/ 24 points   FSS endorsed at 19/ 63 points.   GDS : 1/ 15 points.   Social History   Socioeconomic History   Marital status: Married    Spouse name: Not on file   Number of children: Not on file   Years of education: Not on file   Highest education level: Not on file  Occupational History   Not on file  Tobacco Use   Smoking status: Never   Smokeless tobacco: Never   Substance and Sexual Activity   Alcohol use: Yes    Alcohol/week: 6.0 - 7.0 standard drinks of alcohol    Types: 4 Standard drinks or equivalent, 2 - 3 Glasses of wine per week. 1 drink of hard liquor a week max.     Comment: x a week   Drug use: No   Sexual activity: Yes  Other Topics Concern   Not on file  Social History Narrative   Not on file   Social Determinants of Health   Financial Resource Strain: Not on file  Food Insecurity: Not on file  Transportation Needs: Not on file  Physical Activity: Not on file  Stress: Not on file  Social Connections: Not on file    Family History  Problem Relation Age of Onset   Cancer Mother    Cancer Father    Diabetes Father    Hyperlipidemia Father    Cancer Sister    Stroke Maternal Grandmother    Heart disease Maternal Grandfather     Past Medical History:  Diagnosis Date   Allergy    Arthritis    Cancer    Phreesia 07/30/2020   GERD (gastroesophageal reflux disease)    mild no meds   Hypertension    Prostate cancer     Past Surgical History:  Procedure Laterality Date   HAND TENDON SURGERY     Right  arm   JOINT REPLACEMENT N/A    Phreesia 07/30/2020   PROSTATE SURGERY N/A    Phreesia 07/30/2020   PROSTATECTOMY     ROTATOR CUFF REPAIR Bilateral left 2007 and right 09/2009   TOTAL KNEE ARTHROPLASTY Left 01/13/2017   Procedure: LEFT TOTAL KNEE ARTHROPLASTY;  Surgeon: Gaynelle Arabian, MD;  Location: [facility];  Service: Orthopedics;  Laterality: Left;     Current Outpatient Medications on File Prior to Visit  Medication Sig Dispense Refill   atorvastatin (LIPITOR) 10 MG tablet TAKE 1 TABLET BY MOUTH DAILY. CAN START WITH FEW DAY PER WEEK DOSING, INCREASE AS TOLERATED. 90 tablet 1   cetirizine (ZYRTEC) 10 MG tablet Take by mouth.     hydrochlorothiazide (MICROZIDE) 12.5 MG capsule TAKE 1 CAPSULE BY MOUTH EVERY DAY 90 capsule 3   tirzepatide (ZEPBOUND) 7.5 MG/0.5ML Pen Inject 7.5 mg into the skin once a week. 2 mL 0    No current facility-administered medications on file prior to visit.    Allergies  Allergen Reactions   Other     Catgut suture did not dissolve--causes infection (2007)     DIAGNOSTIC DATA (LABS, IMAGING, TESTING) - I  reviewed patient records, labs, notes, testing and imaging myself where available.  Lab Results  Component Value Date   WBC 4.5 08/08/2021   HGB 14.9 08/08/2021   HCT 42.7 08/08/2021   MCV 94.4 08/08/2021   PLT 161.0 08/08/2021      Component Value Date/Time   NA 140 09/18/2022 1206   NA 140 08/02/2020 0833   K 4.1 09/18/2022 1206   CL 105 09/18/2022 1206   CO2 28 09/18/2022 1206   GLUCOSE 94 09/18/2022 1206   BUN 16 09/18/2022 1206   BUN 18 08/02/2020 0833   CREATININE 0.74 09/18/2022 1206   CREATININE 0.90 05/09/2016 0958   CALCIUM 9.6 09/18/2022 1206   PROT 7.3 09/18/2022 1206   PROT 6.9 08/02/2020 0833   ALBUMIN 4.7 09/18/2022 1206   ALBUMIN 4.7 08/02/2020 0833   AST 22 09/18/2022 1206   ALT 30 09/18/2022 1206   ALKPHOS 51 09/18/2022 1206   BILITOT 1.0 09/18/2022 1206   BILITOT 0.8 08/02/2020 0833   GFRNONAA 84 08/02/2020 0833   GFRNONAA >89 11/08/2015 0848   GFRAA 97 08/02/2020 0833   GFRAA >89 11/08/2015 0848   Lab Results  Component Value Date   CHOL 142 09/18/2022   HDL 50.40 09/18/2022   LDLCALC 72 09/18/2022   LDLDIRECT 125.0 08/08/2021   TRIG 100.0 09/18/2022   CHOLHDL 3 09/18/2022   Lab Results  Component Value Date   HGBA1C 5.6 09/18/2022   No results found for: "VITAMINB12" Lab Results  Component Value Date   TSH 1.83 08/08/2021    PHYSICAL EXAM:  Today's Vitals   12/23/22 1048  BP: (!) 141/83  Pulse: (!) 58  Weight: 255 lb 9.6 oz (115.9 kg)  Height: 6\' 2"  (1.88 m)   Body mass index is 32.82 kg/m.   Wt Readings from Last 3 Encounters:  12/23/22 255 lb 9.6 oz (115.9 kg)  10/24/22 254 lb 6.4 oz (115.4 kg)  09/18/22 253 lb (114.8 kg)     Ht Readings from Last 3 Encounters:  12/23/22 6\' 2"  (1.88 m)   10/24/22 6\' 2"  (1.88 m)  09/18/22 6\' 2"  (1.88 m)      General: The patient is awake, alert and appears not in acute distress. The patient is well groomed. Head: Normocephalic, atraumatic. Neck is supple.  Mallampati 2 ,  neck circumference:17.  inches . Nasal airflow patent.   Retrognathia is not  seen.  Dental status: No braces, retainers, biological teeth.  Cardiovascular:  Regular rate and cardiac rhythm by pulse,  without distended neck veins. Respiratory: Lungs are clear to auscultation.  Skin:  Without evidence of ankle edema, or rash. Trunk: The patient's posture is erect.   NEUROLOGIC EXAM: The patient is awake and alert, oriented to place and time.   Memory subjective described as intact.  Attention span & concentration ability appears normal.  Speech is fluent,  without  dysarthria, dysphonia or aphasia.  Mood and affect are appropriate.   Cranial nerves: no loss of smell or taste reported  Pupils are equal and briskly reactive to light. Funduscopic exam deferred .  Extraocular movements in vertical and horizontal planes were intact and without nystagmus. No Diplopia. Visual fields by finger perimetry are intact. Hearing was impaired left over right - soft voice- tinnitus- high pitch , continuously.   Facial sensation intact to fine touch.  Facial motor strength is symmetric and tongue and uvula move midline.  Neck ROM : rotation, tilt and flexion extension were normal for age and shoulder  shrug was symmetrical.    Motor exam:  Symmetric bulk, tone and ROM.   Normal tone without cog- wheeling, asymmetric grip strength . Right hand had suffered an injury.    Sensory:  Fine touch, pinprick and vibration were normal.  Proprioception tested in the upper extremities was normal.   Coordination: Rapid alternating movements in the fingers/hands were of normal speed.  The Finger-to-nose maneuver was intact without evidence of ataxia, dysmetria or tremor.   Gait and station:  Patient could rise unassisted from a seated position, walked without assistive device.  Stance is of normal width/ base and the patient turned with 3 steps.  Toe and heel walk were deferred.  Deep tendon reflexes: in the  upper and lower extremities are symmetric and intact.  Babinski response was deferred.    ASSESSMENT AND PLAN 67 y.o. year old male  here with:    1) COVID related CN damage: 06-2022:  onset of tinnitus, roaring, continuously high pitch - this is present as he wakes up and he can't easily go back to sleep. No nocturia, no GERD.   2) short sleeper- life long, college he could go to work after a night up- but likes to have a power nap.   3) now snoring , witnessed apneas , and this is longer present than the COVID history- according to his wife, a clinical psychologist, he snores for decades. Lost weight on WEGOVY.   We will order a HST for this gentleman. We can see if OSA is present , as we both suspect. It may benefit him in terms of Tinnitus to use a PAP  treatment.  He also has seasonal allergies that sometimes get better on distilled water and filtered air.   I plan to follow up either personally or through our NP within 3-5 months.   I would like to thank Wendie Agreste, MD and Wendie Agreste, Md [address] [place],  [place] [address] for allowing me to meet with and to take care of this pleasant patient.   CC: I will share my notes with Dr Carlota Raspberry.  After spending a total time of  45  minutes face to face and additional time for physical and neurologic examination, review of laboratory studies,  personal review of imaging studies, reports and results of other testing and review of referral information / records as far as provided in visit.   Electronically signed by: Larey Seat, MD 12/23/2022 11:15 AM  [facility] Board certified by The [employer] of Sleep Medicine and Diplomate of the [employer] of  Sleep Medicine. Board certified In Neurology through the [employer], Fellow of the [employer] of Neurology. Medical Director of [facility].

## 2022-12-26 ENCOUNTER — Other Ambulatory Visit: Payer: Self-pay | Admitting: Family Medicine

## 2022-12-26 ENCOUNTER — Encounter: Payer: Self-pay | Admitting: Family Medicine

## 2022-12-26 ENCOUNTER — Ambulatory Visit: Payer: Managed Care, Other (non HMO) | Admitting: Family Medicine

## 2022-12-26 DIAGNOSIS — R7303 Prediabetes: Secondary | ICD-10-CM | POA: Diagnosis not present

## 2022-12-26 DIAGNOSIS — E66811 Obesity, class 1: Secondary | ICD-10-CM

## 2022-12-26 DIAGNOSIS — E785 Hyperlipidemia, unspecified: Secondary | ICD-10-CM | POA: Diagnosis not present

## 2022-12-26 DIAGNOSIS — E669 Obesity, unspecified: Secondary | ICD-10-CM

## 2022-12-26 MED ORDER — ZEPBOUND 10 MG/0.5ML ~~LOC~~ SOAJ
10.0000 mg | SUBCUTANEOUS | 1 refills | Status: DC
Start: 2022-12-26 — End: 2022-12-26

## 2022-12-26 NOTE — Patient Instructions (Addendum)
Increase Zepbound to 10mg  in 2 weeks. Let me know if there are concerns or new side effects. Give me an update in 1 month to decide if higher dose needed.  Sorry to hear about your brother in law.   I will order the coronary calcium scoring.

## 2022-12-26 NOTE — Progress Notes (Signed)
Subjective:  Patient ID: Brian Higgins, male    DOB: 1956/02/18  Age: 67 y.o. MRN: [id number]  CC:  Chief Complaint  Patient presents with   Follow-up    Follow up for Zepbound     HPI Brian Higgins presents for   Obesity with prediabetes Started onset bivalent in December, doing well at his February visit, less appetite suppression than previous would go the, minimal change at his February visit, dosage increased from 2.5 to 5 mg, then 7.5 on 3/15. - on past 2 weeks. Some stress - brother in law passed away suddenly few days ago at age 67. Doing ok - trying to help sister.  Noticed weight loss at 1mg  of Wegovy.  No side effects, n/v, or abd pain on higher dose zepbound.   Wt Readings from Last 3 Encounters:  12/26/22 253 lb 12.8 oz (115.1 kg)  12/23/22 255 lb 9.6 oz (115.9 kg)  10/24/22 254 lb 6.4 oz (115.4 kg)   Hyperlipidemia: Lipitor 10mg  qd.  Would like coronary calcium scoring. No CP with exertion, hiking, exercise.  Lab Results  Component Value Date   CHOL 142 09/18/2022   HDL 50.40 09/18/2022   LDLCALC 72 09/18/2022   LDLDIRECT 125.0 08/08/2021   TRIG 100.0 09/18/2022   CHOLHDL 3 09/18/2022   Lab Results  Component Value Date   ALT 30 09/18/2022   AST 22 09/18/2022   ALKPHOS 51 09/18/2022   BILITOT 1.0 09/18/2022     History Patient Active Problem List   Diagnosis Date Noted   Sensorineural hearing loss (SNHL), bilateral 05/09/2022   Tinnitus of both ears 05/09/2022   Acute cough 09/11/2021   Tibialis posterior tendinitis, right 04/07/2018   Leg length discrepancy 04/07/2018   OA (osteoarthritis) of knee 01/13/2017   Prostate cancer 11/21/2012   Past Medical History:  Diagnosis Date   Allergy    Arthritis    Cancer    Phreesia 07/30/2020   GERD (gastroesophageal reflux disease)    mild no meds   Hypertension    Prostate cancer    Past Surgical History:  Procedure Laterality Date   HAND TENDON SURGERY     Right  arm   JOINT REPLACEMENT  N/A    Phreesia 07/30/2020   PROSTATE SURGERY N/A    Phreesia 07/30/2020   PROSTATECTOMY     ROTATOR CUFF REPAIR Bilateral left 2007 and right 09/2009   TOTAL KNEE ARTHROPLASTY Left 01/13/2017   Procedure: LEFT TOTAL KNEE ARTHROPLASTY;  Surgeon: Gaynelle Arabian, MD;  Location: [facility];  Service: Orthopedics;  Laterality: Left;   Allergies  Allergen Reactions   Other     Catgut suture did not dissolve--causes infection (2007)   Prior to Admission medications   Medication Sig Start Date End Date Taking? Authorizing Provider  atorvastatin (LIPITOR) 10 MG tablet TAKE 1 TABLET BY MOUTH DAILY. CAN START WITH FEW DAY PER WEEK DOSING, INCREASE AS TOLERATED. 08/14/22  Yes Wendie Agreste, MD  cetirizine (ZYRTEC) 10 MG tablet Take by mouth. 05/09/22  Yes [provider]  hydrochlorothiazide (MICROZIDE) 12.5 MG capsule TAKE 1 CAPSULE BY MOUTH EVERY DAY 12/02/22  Yes Wendie Agreste, MD  tirzepatide (ZEPBOUND) 7.5 MG/0.5ML Pen Inject 7.5 mg into the skin once a week. 12/06/22  Yes Wendie Agreste, MD   Social History   Socioeconomic History   Marital status: Married    Spouse name: Not on file   Number of children: Not on file   Years of education: Not  on file   Highest education level: Not on file  Occupational History   Not on file  Tobacco Use   Smoking status: Never   Smokeless tobacco: Never  Substance and Sexual Activity   Alcohol use: Yes    Alcohol/week: 6.0 - 7.0 standard drinks of alcohol    Types: 4 Standard drinks or equivalent, 2 - 3 Glasses of wine per week    Comment: x a week   Drug use: No   Sexual activity: Yes  Other Topics Concern   Not on file  Social History Narrative   Not on file   Social Determinants of Health   Financial Resource Strain: Not on file  Food Insecurity: Not on file  Transportation Needs: Not on file  Physical Activity: Not on file  Stress: Not on file  Social Connections: Not on file  Intimate Partner Violence: Not on file     Review of Systems   Objective:   Vitals:   12/26/22 1148  BP: 118/80  Pulse: 60  Temp: 98.4 F (36.9 C)  TempSrc: Temporal  SpO2: 96%  Weight: 253 lb 12.8 oz (115.1 kg)  Height: 6\' 2"  (1.88 m)     Physical Exam Vitals reviewed.  Constitutional:      General: He is not in acute distress.    Appearance: Normal appearance. He is well-developed. He is obese.  HENT:     Head: Normocephalic and atraumatic.  Neck:     Vascular: No carotid bruit or JVD.  Cardiovascular:     Rate and Rhythm: Normal rate and regular rhythm.     Heart sounds: Normal heart sounds. No murmur heard. Pulmonary:     Effort: Pulmonary effort is normal.     Breath sounds: Normal breath sounds. No rales.  Musculoskeletal:     Right lower leg: No edema.     Left lower leg: No edema.  Skin:    General: Skin is warm and dry.  Neurological:     Mental Status: He is alert and oriented to person, place, and time.  Psychiatric:        Mood and Affect: Mood normal.        Assessment & Plan:  Brian Higgins is a 67 y.o. male . Hyperlipidemia, unspecified hyperlipidemia type - Plan: CT CARDIAC SCORING (SELF PAY ONLY)  - tolerating statin, continue same. Requested CT coronary calcium scoring- ordered.   Prediabetes - Plan: tirzepatide (ZEPBOUND) 10 MG/0.5ML Pen, CT CARDIAC SCORING (SELF PAY ONLY) Obesity (BMI 30.0-34.9) - Plan: tirzepatide (ZEPBOUND) 10 MG/0.5ML Pen  - minimal weight loss. No new side effects, increase zepbound dose in 2 weeks, with update in next 6 weeks. Possible side effects discussed.   Meds ordered this encounter  Medications   tirzepatide (ZEPBOUND) 10 MG/0.5ML Pen    Sig: Inject 10 mg into the skin once a week.    Dispense:  2 mL    Refill:  1   Patient Instructions  Increase Zepbound to 10mg  in 2 weeks. Let me know if there are concerns or new side effects. Give me an update in 1 month to decide if higher dose needed.  Sorry to hear about your brother in law.   I  will order the coronary calcium scoring.         Signed,   [name], MD [place] 12/26/22 12:21 PM

## 2022-12-27 ENCOUNTER — Other Ambulatory Visit: Payer: Self-pay | Admitting: Family Medicine

## 2022-12-27 DIAGNOSIS — E669 Obesity, unspecified: Secondary | ICD-10-CM

## 2022-12-27 DIAGNOSIS — R7303 Prediabetes: Secondary | ICD-10-CM

## 2023-01-09 ENCOUNTER — Telehealth: Payer: Self-pay | Admitting: Neurology

## 2023-01-09 ENCOUNTER — Encounter: Payer: Self-pay | Admitting: Family Medicine

## 2023-01-09 NOTE — Telephone Encounter (Signed)
HST- [employer] no auth req  Patient is scheduled at [facility] for 01/28/23 at 10:30 AM  Mailed packet to the patient.

## 2023-01-15 ENCOUNTER — Ambulatory Visit (HOSPITAL_COMMUNITY)
Admission: RE | Admit: 2023-01-15 | Discharge: 2023-01-15 | Disposition: A | Discharge status: Home / Self Care | Payer: Managed Care, Other (non HMO) | Source: Ambulatory Visit | Attending: Family Medicine | Admitting: Family Medicine

## 2023-01-15 DIAGNOSIS — R7303 Prediabetes: Secondary | ICD-10-CM | POA: Insufficient documentation

## 2023-01-15 DIAGNOSIS — E785 Hyperlipidemia, unspecified: Secondary | ICD-10-CM | POA: Insufficient documentation

## 2023-01-20 ENCOUNTER — Encounter: Payer: Self-pay | Admitting: Family Medicine

## 2023-01-27 ENCOUNTER — Other Ambulatory Visit: Payer: Self-pay | Admitting: Family Medicine

## 2023-01-27 DIAGNOSIS — E785 Hyperlipidemia, unspecified: Secondary | ICD-10-CM

## 2023-01-28 ENCOUNTER — Ambulatory Visit: Payer: Managed Care, Other (non HMO) | Admitting: Neurology

## 2023-01-28 DIAGNOSIS — F518 Other sleep disorders not due to a substance or known physiological condition: Secondary | ICD-10-CM

## 2023-01-28 DIAGNOSIS — G4733 Obstructive sleep apnea (adult) (pediatric): Secondary | ICD-10-CM | POA: Diagnosis not present

## 2023-01-28 DIAGNOSIS — R0683 Snoring: Secondary | ICD-10-CM

## 2023-01-28 DIAGNOSIS — H9313 Tinnitus, bilateral: Secondary | ICD-10-CM

## 2023-01-28 DIAGNOSIS — H903 Sensorineural hearing loss, bilateral: Secondary | ICD-10-CM

## 2023-01-28 DIAGNOSIS — E6609 Other obesity due to excess calories: Secondary | ICD-10-CM

## 2023-01-30 NOTE — Progress Notes (Signed)
[facility]   HOME SLEEP TEST REPORT ( by Watch PAT)   STUDY DATE:  01-30-2023 Brian Higgins 67 year old male 10/05/1955 ORDERING CLINICIAN: Melvyn Novas, MD  REFERRING CLINICIAN:    CLINICAL INFORMATION/HISTORY: 12-23-2022- "I have lived a long time on 5.5 hours of sleep. I  feel refreshed by a power nap, but I am snoring and when I wake up I have trouble going back to sleep due to a roaring tinnitus dominantly in the left ear, usually around 4 AM. " No previous sleep study, seasonal allergies. Zyrtec in seasonal Prn. Tinnitus was new onset with COVID 19 infection in 06-2022.      Epworth sleepiness score: 10/24. FSS at 19/ 63 points.    BMI: 32.8 kg/m   Neck Circumference: 17"   FINDINGS:   Sleep Summary:   Total Recording Time (hours, min):   7 hours 39 minutes  Total Sleep Time (hours, min):   6 hours 43 minutes              Percent REM (%): 25.3%                                      Respiratory Indices:   Calculated pAHI (per hour):   20.7/h                          REM pAHI:    35.6/h                                             NREM pAHI:   15.5/h                           Positional AHI: The patient slept equal amount of time on the right and left side of his body right lateral sleep was associated with an AHI of 23.9 and left-sided sleep with an AHI of 20.5/h.  Snoring reached a mean volume of 41 dB and was present for over half of the recorded time.                                                 Oxygen Saturation Statistics:             O2 Saturation Range (%): Between a nadir of 82% with a maximum saturation of 99% with a mean saturation of 93%.                                      O2 Saturation (minutes) <89%:    10 minutes O2 saturation(minutes) <90% :    13.8 minutes       Pulse Rate Statistics:   Pulse Mean (bpm):     55 bpm            Pulse Range:   Between 45 and 77 bpm.              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  There is also moderate oxygen desaturation noted.  Intermittent bradycardia is seen in the oxygen desaturation events cluster in REM sleep.    RECOMMENDATION: I would strongly recommend to use positive airway pressure therapy, With an auto titration CPAP device between 5 and 20 cmH2O pressure 3 cm expiratory pressure release, humidifier and in the case of a bother dominant snorer a fullface mask has to be considered. I have also will help with his tinnitus may benefit and reduce with apnea treatment.  I will follow-up with [name] after a minimum of 30 days on PAP therapy and a maximum of 90 days.    INTERPRETING PHYSICIAN:   Melvyn Novas, MD  [facility].

## 2023-02-04 NOTE — Procedures (Signed)
[facility]   HOME SLEEP TEST REPORT ( by Watch PAT)   STUDY DATE:  01-30-2023 Brian Higgins 67 year old male May 22, 1956 ORDERING CLINICIAN: Melvyn Novas, MD  REFERRING CLINICIAN:    CLINICAL INFORMATION/HISTORY: 12-23-2022- "I have lived a long time on 5.5 hours of sleep. I  feel refreshed by a power nap, but I am snoring and when I wake up I have trouble going back to sleep due to a roaring tinnitus dominantly in the left ear, usually around 4 AM. " No previous sleep study, seasonal allergies. Zyrtec in seasonal Prn. Tinnitus was new onset with COVID 19 infection in 06-2022.      Epworth sleepiness score: 10/24. FSS at 19/ 63 points.    BMI: 32.8 kg/m   Neck Circumference: 17"   FINDINGS:   Sleep Summary:   Total Recording Time (hours, min):   7 hours 39 minutes  Total Sleep Time (hours, min):   6 hours 43 minutes              Percent REM (%): 25.3%                                      Respiratory Indices:   Calculated pAHI (per hour):   20.7/h                          REM pAHI:    35.6/h                                             NREM pAHI:   15.5/h                           Positional AHI: The patient slept equal amount of time on the right and left side of his body right lateral sleep was associated with an AHI of 23.9 and left-sided sleep with an AHI of 20.5/h.  Snoring reached a mean volume of 41 dB and was present for over half of the recorded time.                                                 Oxygen Saturation Statistics:             O2 Saturation Range (%): Between a nadir of 82% with a maximum saturation of 99% with a mean saturation of 93%.                                      O2 Saturation (minutes) <89%:    10 minutes O2 saturation(minutes) <90% :    13.8 minutes       Pulse Rate Statistics:   Pulse Mean (bpm):     55 bpm            Pulse Range:   Between 45 and 77 bpm.              IMPRESSION:  This HST confirms the  presence of moderate  obstructive sleep apnea that is a REM sleep dependent form of apnea.  There is also moderate oxygen desaturation noted.  Intermittent bradycardia is seen in the oxygen desaturation events cluster in REM sleep.    RECOMMENDATION: I would strongly recommend to use positive airway pressure therapy, With an auto titration CPAP device between 5 and 20 cmH2O pressure 3 cm expiratory pressure release, humidifier and in the case of a bother dominant snorer a fullface mask has to be considered. I have also will help with his tinnitus may benefit and reduce with apnea treatment.  I will follow-up with [name] after a minimum of 30 days on PAP therapy and a maximum of 90 days.    INTERPRETING PHYSICIAN:   Melvyn Novas, MD  [facility].

## 2023-02-04 NOTE — Addendum Note (Signed)
Addended by: Melvyn Novas on: 02/04/2023 05:26 PM   Modules accepted: Orders

## 2023-02-05 ENCOUNTER — Telehealth: Payer: Self-pay

## 2023-02-05 NOTE — Telephone Encounter (Signed)
-----   Message from Melvyn Novas, MD sent at 02/04/2023  5:26 PM EDT ----- I would strongly recommend to use positive airway pressure therapy, With an auto titration CPAP device between 5 and 20 cmH2O pressure 3 cm expiratory pressure release, humidifier and in the case of a bother dominant snorer a fullface mask has to be considered. I have also will help with his tinnitus may benefit and reduce with apnea treatment.  I will follow-up with Brian Higgins after a minimum of 30 days on PAP therapy and a maximum of 90 days.

## 2023-02-05 NOTE — Telephone Encounter (Signed)
I called pt. I advised pt that Dr. Vickey Huger reviewed their sleep study results and found that pt has moderate OSA. Dr. Doristine Locks recommends that pt statrs AutoPAP. I reviewed PAP compliance expectations with the pt. Pt is agreeable to starting a CPAP. I advised pt that an order will be sent to a DME, [facility], and [facility] will call the pt within about one week after they file with the pt's insurance. [facility] will show the pt how to use the machine, fit for masks, and troubleshoot the CPAP if needed. A follow up appt was made for insurance purposes with Tylene Fantasia on 05/13/23 1130. Pt verbalized understanding to arrive 15 minutes early and bring their CPAP. Pt verbalized understanding of results. Pt had no questions at this time but was encouraged to call back if questions arise. I have sent the order to [facility] and have received confirmation that they have received the order.

## 2023-02-20 ENCOUNTER — Telehealth (INDEPENDENT_AMBULATORY_CARE_PROVIDER_SITE_OTHER): Payer: Managed Care, Other (non HMO) | Admitting: Family Medicine

## 2023-02-20 ENCOUNTER — Encounter: Payer: Self-pay | Admitting: Family Medicine

## 2023-02-20 DIAGNOSIS — J22 Unspecified acute lower respiratory infection: Secondary | ICD-10-CM | POA: Diagnosis not present

## 2023-02-20 DIAGNOSIS — R059 Cough, unspecified: Secondary | ICD-10-CM

## 2023-02-20 MED ORDER — AZITHROMYCIN 250 MG PO TABS
ORAL_TABLET | ORAL | 0 refills | Status: AC
Start: 2023-02-20 — End: 2023-02-25

## 2023-02-20 MED ORDER — BENZONATATE 100 MG PO CAPS
100.0000 mg | ORAL_CAPSULE | Freq: Three times a day (TID) | ORAL | 0 refills | Status: DC | PRN
Start: 2023-02-20 — End: 2023-10-03

## 2023-02-20 MED ORDER — HYDROCOD POLI-CHLORPHE POLI ER 10-8 MG/5ML PO SUER
5.0000 mL | Freq: Two times a day (BID) | ORAL | 0 refills | Status: DC | PRN
Start: 2023-02-20 — End: 2023-10-03

## 2023-02-20 NOTE — Patient Instructions (Signed)
Sorry you are sick.  Let me know the results of the COVID test from home but I expect that to be negative.  I think you probably did pick up a virus from your grandchild.  Mucinex is fine for cough during the day, or the Jerilynn Som was sent to the pharmacy.  Test and ask if needed at night for sleep.  Do not take Zyrtec while taking Tussionex.  If cough is not improving in the next day or 2 I did send in the azithromycin or Z-Pak antibiotic.  If any shortness of breath, fevers or worsening symptoms be seen in person.  Hope you feel better soon and stay safe on your trip next week.

## 2023-02-20 NOTE — Progress Notes (Signed)
Virtual Visit via Video Note  I connected with Brian Higgins on 02/20/23 at 12:33 PM by a video enabled telemedicine application and verified that I am speaking with the correct person using two identifiers.  Patient location: work. By self.  My location: office [place].    I discussed the limitations, risks, security and privacy concerns of performing an evaluation and management service by telephone and the availability of in person appointments. I also discussed with the patient that there may be a patient responsible charge related to this service. The patient expressed understanding and agreed to proceed, consent obtained  Chief complaint:  Chief Complaint  Patient presents with   Cough    Pt notes coughing, sinus drainage, congestion, productive cough, took mucinex and nyquil got some rest, denied fever and chills     History of Present Illness: Brian Higgins is a 67 y.o. male  Cough: With sinus congestion - started about 4 days ago. Initial runny nose, then chest congestion, cough. Persistent cough and runny nose. Min relief with mucinex, some relief with cough drop and tea. Sleep difficulty with cough. No fever/dyspnea.  Some hoarseness, change in voice, drinking fluids.  Sick contact - granddaughter had cold last weekend - around her this weekend.  Did not check covid testing.  Productive cough - clear mucus at this time.  Leaving to go out of town next week.  Off zyrtec for now.    Patient Active Problem List   Diagnosis Date Noted   Sensorineural hearing loss (SNHL), bilateral 05/09/2022   Tinnitus of both ears 05/09/2022   Acute cough 09/11/2021   Tibialis posterior tendinitis, right 04/07/2018   Leg length discrepancy 04/07/2018   OA (osteoarthritis) of knee 01/13/2017   Prostate cancer (HCC) 11/21/2012   Past Medical History:  Diagnosis Date   Allergy    Arthritis    Cancer (HCC)    Phreesia 07/30/2020   GERD (gastroesophageal reflux disease)     mild no meds   Hypertension    Prostate cancer [facility])    Past Surgical History:  Procedure Laterality Date   HAND TENDON SURGERY     Right  arm   JOINT REPLACEMENT N/A    Phreesia 07/30/2020   PROSTATE SURGERY N/A    Phreesia 07/30/2020   PROSTATECTOMY     ROTATOR CUFF REPAIR Bilateral left 2007 and right 09/2009   TOTAL KNEE ARTHROPLASTY Left 01/13/2017   Procedure: LEFT TOTAL KNEE ARTHROPLASTY;  Surgeon: Ollen Gross, MD;  Location: [facility];  Service: Orthopedics;  Laterality: Left;   Allergies  Allergen Reactions   Other     Catgut suture did not dissolve--causes infection (2007)   Prior to Admission medications   Medication Sig Start Date End Date Taking? Authorizing Provider  atorvastatin (LIPITOR) 10 MG tablet TAKE 1 TABLET BY MOUTH DAILY. CAN START WITH FEW DAY PER WEEK DOSING, INCREASE AS TOLERATED. 01/27/23  Yes Shade Flood, MD  cetirizine (ZYRTEC) 10 MG tablet Take by mouth. 05/09/22  Yes [provider]  hydrochlorothiazide (MICROZIDE) 12.5 MG capsule TAKE 1 CAPSULE BY MOUTH EVERY DAY 12/02/22  Yes Shade Flood, MD  tirzepatide [facility]) 10 MG/0.5ML Pen INJECT 10 MG INTO THE SKIN ONE TIME PER WEEK 12/26/22  Yes Shade Flood, MD   Social History   Socioeconomic History   Marital status: Married    Spouse name: Not on file   Number of children: Not on file   Years of education: Not on file  Highest education level: Not on file  Occupational History   Not on file  Tobacco Use   Smoking status: Never   Smokeless tobacco: Never  Substance and Sexual Activity   Alcohol use: Yes    Alcohol/week: 6.0 - 7.0 standard drinks of alcohol    Types: 4 Standard drinks or equivalent, 2 - 3 Glasses of wine per week    Comment: x a week   Drug use: No   Sexual activity: Yes  Other Topics Concern   Not on file  Social History Narrative   Not on file   Social Determinants of Health   Financial Resource Strain: Not on file  Food Insecurity: Not on  file  Transportation Needs: Not on file  Physical Activity: Not on file  Stress: Not on file  Social Connections: Not on file  Intimate Partner Violence: Not on file    Observations/Objective: Speaking in full sentences, no apparent respiratory distress, no audible wheeze or stridor.  Coherent responses, appropriate responses.  Nontoxic appearance.  Assessment and Plan: LRTI (lower respiratory tract infection) - Plan: azithromycin (ZITHROMAX) 250 MG tablet  Cough, unspecified type - Plan: benzonatate (TESSALON) 100 MG capsule, chlorpheniramine-HYDROcodone (TUSSIONEX) 10-8 MG/5ML  Likely initial viral illness with sick contact, has been settling into his chest, more chest congestion but clear mucus at this time.  Still likely viral infection but option of starting azithromycin in the next day or 2 if not improving or continued worsening chest congestion.  ER/RTC precautions/urgent care precautions discussed if fevers, dyspnea or acute worsening.  For cough can continue Mucinex during the day, Tessalon Perles ordered as well as Tussionex at night if needed with potential side effects discussed, do not combine with Zyrtec.  Plans to check home COVID test and advise me today of results but less likely.  Follow Up Instructions:  As needed I discussed the assessment and treatment plan with the patient. The patient was provided an opportunity to ask questions and all were answered. The patient agreed with the plan and demonstrated an understanding of the instructions.   The patient was advised to call back or seek an in-person evaluation if the symptoms worsen or if the condition fails to improve as anticipated.   Shade Flood, MD

## 2023-03-13 ENCOUNTER — Encounter: Payer: Self-pay | Admitting: Family Medicine

## 2023-04-02 ENCOUNTER — Ambulatory Visit: Payer: Managed Care, Other (non HMO) | Admitting: Family Medicine

## 2023-05-13 ENCOUNTER — Encounter: Payer: Managed Care, Other (non HMO) | Admitting: Adult Health

## 2023-05-22 ENCOUNTER — Telehealth: Payer: Managed Care, Other (non HMO) | Admitting: Adult Health

## 2023-07-09 ENCOUNTER — Telehealth: Payer: Managed Care, Other (non HMO) | Admitting: Neurology

## 2023-08-29 ENCOUNTER — Other Ambulatory Visit: Payer: Self-pay | Admitting: Family Medicine

## 2023-08-29 DIAGNOSIS — E785 Hyperlipidemia, unspecified: Secondary | ICD-10-CM

## 2023-10-03 ENCOUNTER — Ambulatory Visit (INDEPENDENT_AMBULATORY_CARE_PROVIDER_SITE_OTHER): Payer: Medicare HMO | Admitting: Family Medicine

## 2023-10-03 VITALS — BP 138/80 | HR 62 | Temp 97.6°F | Ht 73.25 in | Wt 261.6 lb

## 2023-10-03 DIAGNOSIS — R7303 Prediabetes: Secondary | ICD-10-CM | POA: Diagnosis not present

## 2023-10-03 DIAGNOSIS — Z1329 Encounter for screening for other suspected endocrine disorder: Secondary | ICD-10-CM | POA: Diagnosis not present

## 2023-10-03 DIAGNOSIS — Z0000 Encounter for general adult medical examination without abnormal findings: Secondary | ICD-10-CM | POA: Diagnosis not present

## 2023-10-03 DIAGNOSIS — C61 Malignant neoplasm of prostate: Secondary | ICD-10-CM

## 2023-10-03 DIAGNOSIS — E785 Hyperlipidemia, unspecified: Secondary | ICD-10-CM

## 2023-10-03 DIAGNOSIS — Z23 Encounter for immunization: Secondary | ICD-10-CM

## 2023-10-03 DIAGNOSIS — I10 Essential (primary) hypertension: Secondary | ICD-10-CM

## 2023-10-03 LAB — COMPREHENSIVE METABOLIC PANEL
ALT: 24 U/L (ref 0–53)
AST: 19 U/L (ref 0–37)
Albumin: 4.9 g/dL (ref 3.5–5.2)
Alkaline Phosphatase: 56 U/L (ref 39–117)
BUN: 23 mg/dL (ref 6–23)
CO2: 28 meq/L (ref 19–32)
Calcium: 9.7 mg/dL (ref 8.4–10.5)
Chloride: 103 meq/L (ref 96–112)
Creatinine, Ser: 0.93 mg/dL (ref 0.40–1.50)
GFR: 85.23 mL/min (ref >60.00)
Glucose, Bld: 92 mg/dL (ref 70–99)
Potassium: 4.6 meq/L (ref 3.5–5.1)
Sodium: 140 meq/L (ref 135–145)
Total Bilirubin: 1.2 mg/dL (ref 0.2–1.2)
Total Protein: 7.1 g/dL (ref 6.0–8.3)

## 2023-10-03 LAB — CBC WITH DIFFERENTIAL/PLATELET
Basophils Absolute: 0 10*3/uL (ref 0.0–0.1)
Basophils Relative: 0.6 % (ref 0.0–3.0)
Eosinophils Absolute: 0.1 10*3/uL (ref 0.0–0.7)
Eosinophils Relative: 1.1 % (ref 0.0–5.0)
HCT: 46.7 % (ref 39.0–52.0)
Hemoglobin: 15.6 g/dL (ref 13.0–17.0)
Lymphocytes Relative: 42.5 % (ref 12.0–46.0)
Lymphs Abs: 2.2 10*3/uL (ref 0.7–4.0)
MCHC: 33.4 g/dL (ref 30.0–36.0)
MCV: 95.5 fL (ref 78.0–100.0)
Monocytes Absolute: 0.5 10*3/uL (ref 0.1–1.0)
Monocytes Relative: 9.6 % (ref 3.0–12.0)
Neutro Abs: 2.4 10*3/uL (ref 1.4–7.7)
Neutrophils Relative %: 46.2 % (ref 43.0–77.0)
Platelets: 197 10*3/uL (ref 150.0–400.0)
RBC: 4.89 Mil/uL (ref 4.22–5.81)
RDW: 13.7 % (ref 11.5–15.5)
WBC: 5.3 10*3/uL (ref 4.0–10.5)

## 2023-10-03 LAB — TSH: TSH: 2.13 u[IU]/mL (ref 0.35–5.50)

## 2023-10-03 LAB — LIPID PANEL
Cholesterol: 137 mg/dL (ref 0–200)
HDL: 43.1 mg/dL (ref >39.00)
LDL Cholesterol: 70 mg/dL (ref 0–99)
NonHDL: 94.11
Total CHOL/HDL Ratio: 3
Triglycerides: 119 mg/dL (ref 0.0–149.0)
VLDL: 23.8 mg/dL (ref 0.0–40.0)

## 2023-10-03 LAB — HEMOGLOBIN A1C: Hgb A1c MFr Bld: 6 % (ref 4.6–6.5)

## 2023-10-03 LAB — PSA, MEDICARE: PSA: 0.01 ng/mL — ABNORMAL LOW (ref 0.10–4.00)

## 2023-10-03 NOTE — Patient Instructions (Signed)
 I do not see any signs of ear infection at this time.  Some of that discomfort could be related to some pressure behind the ear or fluid behind the ear from recent upper respiratory infection that appears to be improving.  Follow-up if not continuing to improving or if any new symptoms.  If any concerns on labs I will let you know, no med changes for now.  Take care!  Preventive Care 84 Years and Older, Male Preventive care refers to lifestyle choices and visits with your health care provider that can promote health and wellness. Preventive care visits are also called wellness exams. What can I expect for my preventive care visit? Counseling During your preventive care visit, your health care provider may ask about your: Medical history, including: Past medical problems. Family medical history. History of falls. Current health, including: Emotional well-being. Home life and relationship well-being. Sexual activity. Memory and ability to understand (cognition). Lifestyle, including: Alcohol, nicotine or tobacco, and drug use. Access to firearms. Diet, exercise, and sleep habits. Work and work astronomer. Sunscreen use. Safety issues such as seatbelt and bike helmet use. Physical exam Your health care provider will check your: Height and weight. These may be used to calculate your BMI (body mass index). BMI is a measurement that tells if you are at a healthy weight. Waist circumference. This measures the distance around your waistline. This measurement also tells if you are at a healthy weight and may help predict your risk of certain diseases, such as type 2 diabetes and high blood pressure. Heart rate and blood pressure. Body temperature. Skin for abnormal spots. What immunizations do I need?  Vaccines are usually given at various ages, according to a schedule. Your health care provider will recommend vaccines for you based on your age, medical history, and lifestyle or other  factors, such as travel or where you work. What tests do I need? Screening Your health care provider may recommend screening tests for certain conditions. This may include: Lipid and cholesterol levels. Diabetes screening. This is done by checking your blood sugar (glucose) after you have not eaten for a while (fasting). Hepatitis C test. Hepatitis B test. HIV (human immunodeficiency virus) test. STI (sexually transmitted infection) testing, if you are at risk. Lung cancer screening. Colorectal cancer screening. Prostate cancer screening. Abdominal aortic aneurysm (AAA) screening. You may need this if you are a current or former smoker. Talk with your health care provider about your test results, treatment options, and if necessary, the need for more tests. Follow these instructions at home: Eating and drinking  Eat a diet that includes fresh fruits and vegetables, whole grains, lean protein, and low-fat dairy products. Limit your intake of foods with high amounts of sugar, saturated fats, and salt. Take vitamin and mineral supplements as recommended by your health care provider. Do not drink alcohol if your health care provider tells you not to drink. If you drink alcohol: Limit how much you have to 0-2 drinks a day. Know how much alcohol is in your drink. In the [place] one drink equals one 12 oz bottle of beer (355 mL), one 5 oz glass of wine (148 mL), or one 1 oz glass of hard liquor (44 mL). Lifestyle Brush your teeth every morning and night with fluoride  toothpaste. Floss one time each day. Exercise for at least 30 minutes 5 or more days each week. Do not use any products that contain nicotine or tobacco. These products include cigarettes, chewing tobacco, and vaping devices,  such as e-cigarettes. If you need help quitting, ask your health care provider. Do not use drugs. If you are sexually active, practice safe sex. Use a condom or other form of protection to prevent STIs. Take  aspirin only as told by your health care provider. Make sure that you understand how much to take and what form to take. Work with your health care provider to find out whether it is safe and beneficial for you to take aspirin daily. Ask your health care provider if you need to take a cholesterol-lowering medicine (statin). Find healthy ways to manage stress, such as: Meditation, yoga, or listening to music. Journaling. Talking to a trusted person. Spending time with friends and family. Safety Always wear your seat belt while driving or riding in a vehicle. Do not drive: If you have been drinking alcohol. Do not ride with someone who has been drinking. When you are tired or distracted. While texting. If you have been using any mind-altering substances or drugs. Wear a helmet and other protective equipment during sports activities. If you have firearms in your house, make sure you follow all gun safety procedures. Minimize exposure to UV radiation to reduce your risk of skin cancer. What's next? Visit your health care provider once a year for an annual wellness visit. Ask your health care provider how often you should have your eyes and teeth checked. Stay up to date on all vaccines. This information is not intended to replace advice given to you by your health care provider. Make sure you discuss any questions you have with your health care provider. Document Revised: 03/07/2021 Document Reviewed: 03/07/2021 Elsevier Patient Education  2024 [employer].

## 2023-10-03 NOTE — Progress Notes (Signed)
 Subjective:  Patient ID: Brian Higgins, male    DOB: [birth year]  Age: 68 y.o. MRN: [id number]  CC:  Chief Complaint  Patient presents with   Annual Exam    Pt notes doing pretty well, would like a TSH check, pt is fasting     HPI [name] presents for Annual Exam  PCP, me Sleep specialist, Dr. Chalice, OSA with moderate OSA on HST in May 2024.  Started on CPAP -off last week with sinus issues. Appointment with [name] on January 16.  Urology, Dr. Renda previously with history of prostatectomy in 2008 for prostate cancer  Getting over cold from about 10 days ago. Getting better.   Left ear soreness: Past few weeks, with recent congestion. Sore to use swab- able to hear same. Hx of tinnitus, hearing loss. Recent URI sx's, congestion/runny nose. No fever. Getting better, but still some ear pain on left. No ear d/c or bleeding   Hypertension: Treated with hydrochlorothiazide  12.5 mg daily. No new side effects.  Home readings: rare readings - 131/[facility] at [facility].  BP Readings from Last 3 Encounters:  10/03/23 138/80  12/26/22 118/80  12/23/22 (!) 141/83   Lab Results  Component Value Date   CREATININE 0.74 09/18/2022    Prediabetes: With obesity, previously treated with zepbound . Stopped last spring d/ cost.  He would like a thyroid  test checked today. Considering semaglutide  through independent provider.  No FH of medullary thyroid  CA, no personal hx of pancreatitis.  No FH of MEN syndrome.  2 sisters with hypothyroidism.   Lab Results  Component Value Date   TSH 1.83 08/08/2021    Lab Results  Component Value Date   HGBA1C 5.6 09/18/2022   Wt Readings from Last 3 Encounters:  10/03/23 261 lb 9.6 oz (118.7 kg)  12/26/22 253 lb 12.8 oz (115.1 kg)  12/23/22 255 lb 9.6 oz (115.9 kg)   Hyperlipidemia: Lipitor 10 mg daily.  No new myalgias/arthralgias, and taking medication daily.  Coronary calcium  score in April 2024 with a score of 78.7, 49th  percentile.  Lab Results  Component Value Date   CHOL 142 09/18/2022   HDL 50.40 09/18/2022   LDLCALC 72 09/18/2022   LDLDIRECT 125.0 08/08/2021   TRIG 100.0 09/18/2022   CHOLHDL 3 09/18/2022   Lab Results  Component Value Date   ALT 30 09/18/2022   AST 22 09/18/2022   ALKPHOS 51 09/18/2022   BILITOT 1.0 09/18/2022          10/03/2023    8:20 AM 12/26/2022   11:53 AM 12/26/2022   11:52 AM 10/24/2022   11:47 AM 09/18/2022   10:54 AM  Depression screen PHQ 2/9  Decreased Interest 0 0 0 0 0  Down, Depressed, Hopeless 0 0 0 0 0  PHQ - 2 Score 0 0 0 0 0  Altered sleeping 0 2   1  Tired, decreased energy 0 0   0  Change in appetite 0 0   0  Feeling bad or failure about yourself  0 0   0  Trouble concentrating 0 0   0  Moving slowly or fidgety/restless 0 0   0  Suicidal thoughts 0 0   0  PHQ-9 Score 0 2   1  Difficult doing work/chores  Not difficult at all       Health Maintenance  Topic Date Due   Medicare Annual Wellness (AWV)  Never done   Zoster Vaccines- Shingrix (2 of 2) 04/02/2021  Pneumonia Vaccine 69+ Years old (1 of 1 - PCV) Never done   INFLUENZA VACCINE  04/24/2023   COVID-19 Vaccine (4 - 2024-25 season) 05/25/2023   Colonoscopy  07/13/2025   Hepatitis C Screening  Completed   HPV VACCINES  Aged Out   DTaP/Tdap/Td  Discontinued  Colonoscopy 07/14/2015, repeat 10 years. Prostate: As above, history of prostatectomy for prostate cancer in 2008.  PSA is undetectable since. Repeat testing today.  Lab Results  Component Value Date   PSA1 <0.1 08/02/2020   PSA1 <0.1 05/22/2018   PSA <0.01 07/17/2015    Immunization History  Administered Date(s) Administered   Fluad Quad(high Dose 65+) 09/18/2022   Influenza,inj,Quad PF,6+ Mos 05/22/2015, 10/30/2017, 05/22/2018, 08/08/2021   Influenza-Unspecified 07/03/2020   PFIZER(Purple Top)SARS-COV-2 Vaccination 11/29/2019, 12/27/2019, 06/20/2020   Tdap 09/24/2011   Zoster Recombinant(Shingrix) 02/05/2021  [facility]  Flu vaccine today.  Covid booster - declined.  Prevnar today.   No results found. Optho - no recent visit - plans to schedule.   Has audiologist. Plans to follow up for persistent tinnitus.   Dental: every 6 months - appt 1/20.   Alcohol: 3 per week.   Tobacco: none  Exercise: tennis 2-3 times per week, some other exercise 5 days per week.    History Patient Active Problem List   Diagnosis Date Noted   Sensorineural hearing loss (SNHL), bilateral 05/09/2022   Tinnitus of both ears 05/09/2022   Acute cough 09/11/2021   Tibialis posterior tendinitis, right 04/07/2018   Leg length discrepancy 04/07/2018   OA (osteoarthritis) of knee 01/13/2017   Prostate cancer (HCC) 11/21/2012   Past Medical History:  Diagnosis Date   Allergy    Arthritis    Cancer (HCC)    [employer]reesia 07/30/2020   GERD (gastroesophageal reflux disease)    mild no meds   Hypertension    Prostate cancer [facility])    Past Surgical History:  Procedure Laterality Date   HAND TENDON SURGERY     Right  arm   JOINT REPLACEMENT N/A    [employer]reesia 07/30/2020   PROSTATE SURGERY N/A    [employer]reesia 07/30/2020   PROSTATECTOMY     ROTATOR CUFF REPAIR Bilateral left 2007 and right 09/2009   TOTAL KNEE ARTHROPLASTY Left 01/13/2017   Procedure: LEFT TOTAL KNEE ARTHROPLASTY;  Surgeon: Dempsey Moan, MD;  Location: [facility];  Service: Orthopedics;  Laterality: Left;   Allergies  Allergen Reactions   Other     Catgut suture did not dissolve--causes infection (2007)   Prior to Admission medications   Medication Sig Start Date End Date Taking? Authorizing Provider  atorvastatin  (LIPITOR) 10 MG tablet TAKE 1 TABLET BY MOUTH DAILY. CAN START WITH FEW DAY PER WEEK DOSING, INCREASE AS TOLERATED. 08/29/23  Yes Levora Reyes SAUNDERS, MD  cetirizine (ZYRTEC) 10 MG tablet Take by mouth. 05/09/22  Yes [provider]  hydrochlorothiazide  (MICROZIDE ) 12.5 MG capsule TAKE 1 CAPSULE BY MOUTH EVERY DAY 12/02/22  Yes  Levora Reyes SAUNDERS, MD   Social History   Socioeconomic History   Marital status: Married    Spouse name: Not on file   Number of children: Not on file   Years of education: Not on file   Highest education level: Bachelor's degree (e.g., BA, AB, BS)  Occupational History   Not on file  Tobacco Use   Smoking status: Never   Smokeless tobacco: Never  Substance and Sexual Activity   Alcohol use: Yes    Alcohol/week: 6.0 -  7.0 standard drinks of alcohol    Types: 4 Standard drinks or equivalent, 2 - 3 Glasses of wine per week    Comment: x a week   Drug use: No   Sexual activity: Yes  Other Topics Concern   Not on file  Social History Narrative   Not on file   Social Drivers of Health   Financial Resource Strain: Low Risk  (09/29/2023)   Overall Financial Resource Strain (CARDIA)    Difficulty of Paying Living Expenses: Not hard at all  Food Insecurity: No Food Insecurity (09/29/2023)   Hunger Vital Sign    Worried About Running Out of Food in the Last Year: Never true    Ran Out of Food in the Last Year: Never true  Transportation Needs: No Transportation Needs (09/29/2023)   PRAPARE - Administrator, Civil Service (Medical): No    Lack of Transportation (Non-Medical): No  Physical Activity: Sufficiently Active (09/29/2023)   Exercise Vital Sign    Days of Exercise per Week: 4 days    Minutes of Exercise per Session: 60 min  Stress: No Stress Concern Present (09/29/2023)   [employer] of Occupational Health - Occupational Stress Questionnaire    Feeling of Stress : Not at all  Social Connections: Socially Integrated (09/29/2023)   Social Connection and Isolation Panel [NHANES]    Frequency of Communication with Friends and Family: More than three times a week    Frequency of Social Gatherings with Friends and Family: Twice a week    Attends Religious Services: More than 4 times per year    Active Member of [employer] or Organizations: Yes    Attends Museum/gallery Exhibitions Officer: More than 4 times per year    Marital Status: Married  Catering Manager Violence: Not on file    Review of Systems 13 point review of systems per patient health survey noted.  Negative other than as indicated above or in HPI.    Objective:   Vitals:   10/03/23 0819  BP: 138/80  Pulse: 62  Temp: 97.6 F (36.4 C)  TempSrc: Temporal  SpO2: 98%  Weight: 261 lb 9.6 oz (118.7 kg)  Height: 6' 1.25 (1.861 m)     Physical Exam Vitals reviewed.  Constitutional:      Appearance: He is well-developed.  HENT:     Head: Normocephalic and atraumatic.     Right Ear: Ear canal and external ear normal. There is no impacted cerumen.     Left Ear: Ear canal and external ear normal. There is no impacted cerumen.     Ears:     Comments: Minimal clear fluid behind the left TM base, no retraction, bulging, erythema or injection. Eyes:     Conjunctiva/sclera: Conjunctivae normal.     Pupils: Pupils are equal, round, and reactive to light.  Neck:     Thyroid : No thyromegaly.  Cardiovascular:     Rate and Rhythm: Normal rate and regular rhythm.     Heart sounds: Normal heart sounds.  Pulmonary:     Effort: Pulmonary effort is normal. No respiratory distress.     Breath sounds: Normal breath sounds. No wheezing.  Abdominal:     General: There is no distension.     Palpations: Abdomen is soft.     Tenderness: There is no abdominal tenderness.  Musculoskeletal:        General: No tenderness. Normal range of motion.     Cervical back: Normal range of  motion and neck supple.  Lymphadenopathy:     Cervical: No cervical adenopathy.  Skin:    General: Skin is warm and dry.  Neurological:     Mental Status: He is alert and oriented to person, place, and time.     Deep Tendon Reflexes: Reflexes are normal and symmetric.  Psychiatric:        Behavior: Behavior normal.        Assessment & Plan:  Hal Norrington is a 68 y.o. male . Annual physical exam -  -anticipatory guidance as below in AVS, screening labs above. Health maintenance items as above in HPI discussed/recommended as applicable.   Hyperlipidemia, unspecified hyperlipidemia type - Plan: Comprehensive metabolic panel, Lipid panel  -Tolerating current regimen, continue same  Prediabetes - Plan: Comprehensive metabolic panel, Hemoglobin A1c  -Last A1c borderline, few pound weight gain.  He is considering GLP-1.  Check A1c.  Essential hypertension - Plan: CBC with Differential/Platelet  -Stable on current regimen, continue same, check labs Need for influenza vaccination - Plan: Flu Vaccine Trivalent High Dose (Fluad)  Prostate cancer (HCC) - Plan: PSA, Medicare  -History of prostatectomy, check PSA for ongoing monitoring.  Screening for thyroid  disorder - Plan: TSH  -With family history of hypothyroidism.  Need for pneumococcal vaccination - Plan: Pneumococcal conjugate vaccine 20-valent (Prevnar 20)   No orders of the defined types were placed in this encounter.  Patient Instructions  I do not see any signs of ear infection at this time.  Some of that discomfort could be related to some pressure behind the ear or fluid behind the ear from recent upper respiratory infection that appears to be improving.  Follow-up if not continuing to improving or if any new symptoms.  If any concerns on labs I will let you know, no med changes for now.  Take care!  Preventive Care 69 Years and Older, Male Preventive care refers to lifestyle choices and visits with your health care provider that can promote health and wellness. Preventive care visits are also called wellness exams. What can I expect for my preventive care visit? Counseling During your preventive care visit, your health care provider may ask about your: Medical history, including: Past medical problems. Family medical history. History of falls. Current health, including: Emotional well-being. Home life and relationship  well-being. Sexual activity. Memory and ability to understand (cognition). Lifestyle, including: Alcohol, nicotine or tobacco, and drug use. Access to firearms. Diet, exercise, and sleep habits. Work and work astronomer. Sunscreen use. Safety issues such as seatbelt and bike helmet use. Physical exam Your health care provider will check your: Height and weight. These may be used to calculate your BMI (body mass index). BMI is a measurement that tells if you are at a healthy weight. Waist circumference. This measures the distance around your waistline. This measurement also tells if you are at a healthy weight and may help predict your risk of certain diseases, such as type 2 diabetes and high blood pressure. Heart rate and blood pressure. Body temperature. Skin for abnormal spots. What immunizations do I need?  Vaccines are usually given at various ages, according to a schedule. Your health care provider will recommend vaccines for you based on your age, medical history, and lifestyle or other factors, such as travel or where you work. What tests do I need? Screening Your health care provider may recommend screening tests for certain conditions. This may include: Lipid and cholesterol levels. Diabetes screening. This is done by checking your blood sugar (  glucose) after you have not eaten for a while (fasting). Hepatitis C test. Hepatitis B test. HIV (human immunodeficiency virus) test. STI (sexually transmitted infection) testing, if you are at risk. Lung cancer screening. Colorectal cancer screening. Prostate cancer screening. Abdominal aortic aneurysm (AAA) screening. You may need this if you are a current or former smoker. Talk with your health care provider about your test results, treatment options, and if necessary, the need for more tests. Follow these instructions at home: Eating and drinking  Eat a diet that includes fresh fruits and vegetables, whole grains, lean  protein, and low-fat dairy products. Limit your intake of foods with high amounts of sugar, saturated fats, and salt. Take vitamin and mineral supplements as recommended by your health care provider. Do not drink alcohol if your health care provider tells you not to drink. If you drink alcohol: Limit how much you have to 0-2 drinks a day. Know how much alcohol is in your drink. In the [place] one drink equals one 12 oz bottle of beer (355 mL), one 5 oz glass of wine (148 mL), or one 1 oz glass of hard liquor (44 mL). Lifestyle Brush your teeth every morning and night with fluoride  toothpaste. Floss one time each day. Exercise for at least 30 minutes 5 or more days each week. Do not use any products that contain nicotine or tobacco. These products include cigarettes, chewing tobacco, and vaping devices, such as e-cigarettes. If you need help quitting, ask your health care provider. Do not use drugs. If you are sexually active, practice safe sex. Use a condom or other form of protection to prevent STIs. Take aspirin only as told by your health care provider. Make sure that you understand how much to take and what form to take. Work with your health care provider to find out whether it is safe and beneficial for you to take aspirin daily. Ask your health care provider if you need to take a cholesterol-lowering medicine (statin). Find healthy ways to manage stress, such as: Meditation, yoga, or listening to music. Journaling. Talking to a trusted person. Spending time with friends and family. Safety Always wear your seat belt while driving or riding in a vehicle. Do not drive: If you have been drinking alcohol. Do not ride with someone who has been drinking. When you are tired or distracted. While texting. If you have been using any mind-altering substances or drugs. Wear a helmet and other protective equipment during sports activities. If you have firearms in your house, make sure you follow  all gun safety procedures. Minimize exposure to UV radiation to reduce your risk of skin cancer. What's next? Visit your health care provider once a year for an annual wellness visit. Ask your health care provider how often you should have your eyes and teeth checked. Stay up to date on all vaccines. This information is not intended to replace advice given to you by your health care provider. Make sure you discuss any questions you have with your health care provider. Document Revised: 03/07/2021 Document Reviewed: 03/07/2021 Elsevier Patient Education  2024 Elsevier Inc.     Signed,   Reyes Pines, MD [facility], [facility] 10/03/23 8:58 AM

## 2023-10-08 ENCOUNTER — Encounter: Payer: Self-pay | Admitting: Anesthesiology

## 2023-10-08 NOTE — Progress Notes (Deleted)
 Patient: Brian Higgins Date of Birth: February 25, 1956  Reason for Visit: Follow up History from: Patient Primary Neurologist: Dohmeier   ASSESSMENT AND PLAN 68 y.o. year old male    HISTORY OF PRESENT ILLNESS: Today 10/08/23 Saw Dr. Vickey Huger 12/23/22 for sleep consult.  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  Moderate oxygen desaturation noted.  Intermittent bradycardia with oxygen desaturation events.  Strongly recommended CPAP.  HISTORY  Brian Higgins is a 68 y.o. male patient who is seen upon referral on 12/23/2022 from Dr Neva Seat for a Sleep consultation. Chief concern according to patient :  I lived a long time on 5.5 hours of sleep. I  feel refreshed by a power nap, I am snoring and when I wake up I have trouble going back to sleep due to a roaring tinnitus dominantly in the left ear, usually around 4 AM.    I have the pleasure of seeing Brian Higgins  on 12/23/22 a right -handed male.     12-23-2022:  No previous sleep study, seasonal allergies. Zyrtec in seasonal Prn. Tinnitus was new onset with COVID 19 infection in 06-2022.  Sleep relevant medical history: Nocturia may be once- no Sleep walking, no Tonsillectomy, no cervical spine surgery. No ENT.    Family medical /sleep history: son and sister on CPAP with OSA, both had also insomnia. Lost his eldest son in March 15 years ago at age 62. He unexplainably died in his sleep.    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 2  grandchildren.  The patient currently works full time. Daytime.  Tobacco use: none .  ETOH use ; 3-4 glasses a week,  Caffeine intake in form of Coffee( 4 cups a day , morning and after lunch more hot tea) Soda( /).  No energy drinks Exercise in form of Tennis twice weekly, walking 2-3 times a week- 4 miles  average. Gym in Winter.     Sleep habits are as follows: The patient's dinner time is between 7.30-8 PM. The patient goes to bed at 11 PM and continues to sleep for 5 hours,  reads in bed - asleep in 20 minutes. Avoids screen exposure.  Bedroom is cool, quiet and dark. White noise.  He wakes for one time and uses this for a 1 time bathroom break   The preferred sleep position is laterally, with the support of 1 pillow, flat bed .  Dreams are reportedly rare.  The patient wakes up spontaneously. 4.30-5  AM is the usual rise time. He/reports not always feeling refreshed or restored in AM, with symptoms such as dry mouth, morning tinnitus , no headaches, and residual fatigue. Naps are taken frequently, lasting from 20 to 30 minutes and are more refreshing than nocturnal sleep.   REVIEW OF SYSTEMS: Out of a complete 14 system review of symptoms, the patient complains only of the following symptoms, and all other reviewed systems are negative.  See HPI  ALLERGIES: Allergies  Allergen Reactions   Other     Catgut suture did not dissolve--causes infection (2007)    HOME MEDICATIONS: Outpatient Medications Prior to Visit  Medication Sig Dispense Refill   atorvastatin (LIPITOR) 10 MG tablet TAKE 1 TABLET BY MOUTH DAILY. CAN START WITH FEW DAY PER WEEK DOSING, INCREASE  AS TOLERATED. 90 tablet 1   cetirizine (ZYRTEC) 10 MG tablet Take by mouth.     hydrochlorothiazide (MICROZIDE) 12.5 MG capsule TAKE 1 CAPSULE BY MOUTH EVERY DAY 90 capsule 3   No facility-administered medications prior to visit.    PAST MEDICAL HISTORY: Past Medical History:  Diagnosis Date   Allergy    Arthritis    Cancer (HCC)    Phreesia 07/30/2020   GERD (gastroesophageal reflux disease)    mild no meds   Hypertension    Prostate cancer (HCC)     PAST SURGICAL HISTORY: Past Surgical History:  Procedure Laterality Date   HAND TENDON SURGERY     Right  arm   JOINT REPLACEMENT N/A    Phreesia 07/30/2020    PROSTATE SURGERY N/A    Phreesia 07/30/2020   PROSTATECTOMY     ROTATOR CUFF REPAIR Bilateral left 2007 and right 09/2009   TOTAL KNEE ARTHROPLASTY Left 01/13/2017   Procedure: LEFT TOTAL KNEE ARTHROPLASTY;  Surgeon: Ollen Gross, MD;  Location: [facility];  Service: Orthopedics;  Laterality: Left;    FAMILY HISTORY: Family History  Problem Relation Age of Onset   Cancer Mother    Cancer Father    Diabetes Father    Hyperlipidemia Father    Cancer Sister    Stroke Maternal Grandmother    Heart disease Maternal Grandfather     SOCIAL HISTORY: Social History   Socioeconomic History   Marital status: Married    Spouse name: Not on file   Number of children: Not on file   Years of education: Not on file   Highest education level: Bachelor's degree (e.g., BA, AB, BS)  Occupational History   Not on file  Tobacco Use   Smoking status: Never   Smokeless tobacco: Never  Substance and Sexual Activity   Alcohol use: Yes    Alcohol/week: 6.0 - 7.0 standard drinks of alcohol    Types: 4 Standard drinks or equivalent, 2 - 3 Glasses of wine per week    Comment: x a week   Drug use: No   Sexual activity: Yes  Other Topics Concern   Not on file  Social History Narrative   Not on file   Social Drivers of Health   Financial Resource Strain: Low Risk  (09/29/2023)   Overall Financial Resource Strain (CARDIA)    Difficulty of Paying Living Expenses: Not hard at all  Food Insecurity: No Food Insecurity (09/29/2023)   Hunger Vital Sign    Worried About Running Out of Food in the Last Year: Never true    Ran Out of Food in the Last Year: Never true  Transportation Needs: No Transportation Needs (09/29/2023)   PRAPARE - Administrator, Civil Service (Medical): No    Lack of Transportation (Non-Medical): No  Physical Activity: Sufficiently Active (09/29/2023)   Exercise Vital Sign    Days of Exercise per Week: 4 days    Minutes of Exercise per Session: 60 min  Stress: No Stress  Concern Present (09/29/2023)   [employer] of Occupational Health - Occupational Stress Questionnaire    Feeling of Stress : Not at all  Social Connections: Socially Integrated (09/29/2023)   Social Connection and Isolation Panel [NHANES]    Frequency of Communication with Friends and Family: More than three times a week    Frequency of Social Gatherings with Friends and Family: Twice a week    Attends Religious Services: More than 4 times per year  Active Member of Clubs or Organizations: Yes    Attends Banker Meetings: More than 4 times per year    Marital Status: Married  Catering manager Violence: Not on file    PHYSICAL EXAM  There were no vitals filed for this visit. There is no height or weight on file to calculate BMI.  Generalized: Well developed, in no acute distress  Neurological examination  Mentation: Alert oriented to time, place, history taking. Follows all commands speech and language fluent Cranial nerve II-XII: Pupils were equal round reactive to light. Extraocular movements were full, visual field were full on confrontational test. Facial sensation and strength were normal. Uvula tongue midline. Head turning and shoulder shrug  were normal and symmetric. Motor: The motor testing reveals 5 over 5 strength of all 4 extremities. Good symmetric motor tone is noted throughout.  Sensory: Sensory testing is intact to soft touch on all 4 extremities. No evidence of extinction is noted.  Coordination: Cerebellar testing reveals good finger-nose-finger and heel-to-shin bilaterally.  Gait and station: Gait is normal. Tandem gait is normal. Romberg is negative. No drift is seen.  Reflexes: Deep tendon reflexes are symmetric and normal bilaterally.   DIAGNOSTIC DATA (LABS, IMAGING, TESTING) - I reviewed patient records, labs, notes, testing and imaging myself where available.  Lab Results  Component Value Date   WBC 5.3 10/03/2023   HGB 15.6 10/03/2023    HCT 46.7 10/03/2023   MCV 95.5 10/03/2023   PLT 197.0 10/03/2023      Component Value Date/Time   NA 140 10/03/2023 0857   NA 140 08/02/2020 0833   K 4.6 10/03/2023 0857   CL 103 10/03/2023 0857   CO2 28 10/03/2023 0857   GLUCOSE 92 10/03/2023 0857   BUN 23 10/03/2023 0857   BUN 18 08/02/2020 0833   CREATININE 0.93 10/03/2023 0857   CREATININE 0.90 05/09/2016 0958   CALCIUM 9.7 10/03/2023 0857   PROT 7.1 10/03/2023 0857   PROT 6.9 08/02/2020 0833   ALBUMIN 4.9 10/03/2023 0857   ALBUMIN 4.7 08/02/2020 0833   AST 19 10/03/2023 0857   ALT 24 10/03/2023 0857   ALKPHOS 56 10/03/2023 0857   BILITOT 1.2 10/03/2023 0857   BILITOT 0.8 08/02/2020 0833   GFRNONAA 84 08/02/2020 0833   GFRNONAA >89 11/08/2015 0848   GFRAA 97 08/02/2020 0833   GFRAA >89 11/08/2015 0848   Lab Results  Component Value Date   CHOL 137 10/03/2023   HDL 43.10 10/03/2023   LDLCALC 70 10/03/2023   LDLDIRECT 125.0 08/08/2021   TRIG 119.0 10/03/2023   CHOLHDL 3 10/03/2023   Lab Results  Component Value Date   HGBA1C 6.0 10/03/2023   No results found for: "ZOXWRUEA54" Lab Results  Component Value Date   TSH 2.13 10/03/2023    Margie Ege, AGNP-C, DNP 10/08/2023, 3:27 PM [facility] [address], [address] [place], [place] [address] [phone]

## 2023-10-09 ENCOUNTER — Ambulatory Visit: Payer: Medicare Other | Admitting: Neurology

## 2023-10-09 ENCOUNTER — Telehealth: Payer: Self-pay | Admitting: Neurology

## 2023-10-09 NOTE — Telephone Encounter (Signed)
Pt r/s appt. Wait listed

## 2023-10-20 ENCOUNTER — Telehealth: Payer: Self-pay | Admitting: Family Medicine

## 2023-10-20 NOTE — Telephone Encounter (Signed)
Left message for pt to schedule appt

## 2023-10-20 NOTE — Telephone Encounter (Signed)
-----   Message from [facility] sent at 10/20/2023  1:37 PM EST ----- Regarding: AWV Patient is due for his AWV. Please call him to see if we can get him scheduled for his AWV-Initial with Raynelle Fanning.

## 2023-10-28 DIAGNOSIS — H5201 Hypermetropia, right eye: Secondary | ICD-10-CM | POA: Diagnosis not present

## 2023-12-01 ENCOUNTER — Other Ambulatory Visit: Payer: Self-pay | Admitting: Family Medicine

## 2023-12-01 DIAGNOSIS — I10 Essential (primary) hypertension: Secondary | ICD-10-CM

## 2024-03-01 ENCOUNTER — Other Ambulatory Visit: Payer: Self-pay | Admitting: Family Medicine

## 2024-03-01 DIAGNOSIS — E785 Hyperlipidemia, unspecified: Secondary | ICD-10-CM

## 2024-03-09 DIAGNOSIS — M65312 Trigger thumb, left thumb: Secondary | ICD-10-CM | POA: Insufficient documentation

## 2024-03-09 DIAGNOSIS — M79645 Pain in left finger(s): Secondary | ICD-10-CM | POA: Diagnosis not present

## 2024-03-24 ENCOUNTER — Encounter: Payer: Self-pay | Admitting: Family Medicine

## 2024-03-24 ENCOUNTER — Ambulatory Visit (INDEPENDENT_AMBULATORY_CARE_PROVIDER_SITE_OTHER): Admitting: Family Medicine

## 2024-03-24 VITALS — BP 124/68 | HR 75 | Temp 98.0°F | Resp 15 | Ht 73.25 in | Wt 260.0 lb

## 2024-03-24 DIAGNOSIS — E785 Hyperlipidemia, unspecified: Secondary | ICD-10-CM

## 2024-03-24 DIAGNOSIS — R7303 Prediabetes: Secondary | ICD-10-CM | POA: Diagnosis not present

## 2024-03-24 DIAGNOSIS — I10 Essential (primary) hypertension: Secondary | ICD-10-CM | POA: Diagnosis not present

## 2024-03-24 LAB — LIPID PANEL
Cholesterol: 134 mg/dL (ref 0–200)
HDL: 40.8 mg/dL (ref >39.00)
LDL Cholesterol: 73 mg/dL (ref 0–99)
NonHDL: 93.42
Total CHOL/HDL Ratio: 3
Triglycerides: 103 mg/dL (ref 0.0–149.0)
VLDL: 20.6 mg/dL (ref 0.0–40.0)

## 2024-03-24 LAB — COMPREHENSIVE METABOLIC PANEL WITH GFR
ALT: 22 U/L (ref 0–53)
AST: 21 U/L (ref 0–37)
Albumin: 4.7 g/dL (ref 3.5–5.2)
Alkaline Phosphatase: 53 U/L (ref 39–117)
BUN: 21 mg/dL (ref 6–23)
CO2: 30 meq/L (ref 19–32)
Calcium: 9.7 mg/dL (ref 8.4–10.5)
Chloride: 102 meq/L (ref 96–112)
Creatinine, Ser: 0.97 mg/dL (ref 0.40–1.50)
GFR: 80.76 mL/min (ref >60.00)
Glucose, Bld: 101 mg/dL — ABNORMAL HIGH (ref 70–99)
Potassium: 4.2 meq/L (ref 3.5–5.1)
Sodium: 139 meq/L (ref 135–145)
Total Bilirubin: 1.2 mg/dL (ref 0.2–1.2)
Total Protein: 7.2 g/dL (ref 6.0–8.3)

## 2024-03-24 LAB — HEMOGLOBIN A1C: Hgb A1c MFr Bld: 6.3 % (ref 4.6–6.5)

## 2024-03-24 NOTE — Progress Notes (Signed)
 Subjective:  Patient ID: Brian Higgins, male    DOB: 23-May-1956  Age: 68 y.o. MRN: [id number]  CC:  Chief Complaint  Patient presents with   Medical Management of Chronic Issues    Pt is doing well notes no concerns, FYI pt dislocated Lt thumb and got a shot doing okay now     HPI Brian Higgins presents for   Hypertension: With OSA on CPAP, also treated with HCTZ 12.5 mg daily for hypertension.  No new medication side effects.  Still exercising. Tennis 3 times per week, walking other days. 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  Home readings:rare - stable when checked. Highest 132/72 BP Readings from Last 3 Encounters:  03/24/24 124/68  10/03/23 138/80  12/26/22 118/80   Lab Results  Component Value Date   CREATININE 0.93 10/03/2023   Prediabetes: With obesity, previously treated was zepbound  but stopped last spring due to cost.  He was considering restarting medication with semaglutide  with independent weight loss provider when discussed in January. Decided against restart. Not planning at this time.  Lab Results  Component Value Date   HGBA1C 6.0 10/03/2023   Wt Readings from Last 3 Encounters:  03/24/24 260 lb (117.9 kg)  10/03/23 261 lb 9.6 oz (118.7 kg)  12/26/22 253 lb 12.8 oz (115.1 kg)   Hyperlipidemia: Lipitor 10 mg daily without new myalgias/arthralgias.  49th percentile on coronary calcium  scoring in April 2024. No new side effects/myalgias.  Fasting today.  Lab Results  Component Value Date   CHOL 137 10/03/2023   HDL 43.10 10/03/2023   LDLCALC 70 10/03/2023   LDLDIRECT 125.0 08/08/2021   TRIG 119.0 10/03/2023   CHOLHDL 3 10/03/2023   Lab Results  Component Value Date   ALT 24 10/03/2023   AST 19 10/03/2023   ALKPHOS 56 10/03/2023   BILITOT 1.2 10/03/2023   Thumb injury few weeks ago - saw Dr. Romona. Dislocated thumb, then relocated. trigger thumb, had injection. Getting better.    History Patient Active Problem List    Diagnosis Date Noted   Pain of left thumb 03/09/2024   Trigger thumb of left hand 03/09/2024   Sensorineural hearing loss (SNHL), bilateral 05/09/2022   Tinnitus of both ears 05/09/2022   Acute cough 09/11/2021   Tibialis posterior tendinitis, right 04/07/2018   Leg length discrepancy 04/07/2018   OA (osteoarthritis) of knee 01/13/2017   Prostate cancer (HCC) 11/21/2012   Past Medical History:  Diagnosis Date   Allergy    Arthritis    Cancer (HCC)    Phreesia 07/30/2020   GERD (gastroesophageal reflux disease)    mild no meds   Hypertension    Prostate cancer [facility])    Past Surgical History:  Procedure Laterality Date   HAND TENDON SURGERY     Right  arm   JOINT REPLACEMENT N/A    Phreesia 07/30/2020   PROSTATE SURGERY N/A    Phreesia 07/30/2020   PROSTATECTOMY     ROTATOR CUFF REPAIR Bilateral left 2007 and right 09/2009   TOTAL KNEE ARTHROPLASTY Left 01/13/2017   Procedure: LEFT TOTAL KNEE ARTHROPLASTY;  Surgeon: Dempsey Moan, MD;  Location: [facility];  Service: Orthopedics;  Laterality: Left;   Allergies  Allergen Reactions   Other     Catgut suture did not dissolve--causes infection (2007)   Prior to Admission medications   Medication Sig Start Date End Date Taking? Authorizing Provider  atorvastatin  (LIPITOR) 10 MG tablet TAKE 1 TABLET BY MOUTH DAILY. CAN START WITH FEW  DAY PER WEEK DOSING, INCREASE AS TOLERATED. 03/01/24  Yes Levora Reyes SAUNDERS, MD  cetirizine (ZYRTEC) 10 MG tablet Take by mouth. 05/09/22  Yes [provider]  hydrochlorothiazide  (MICROZIDE ) 12.5 MG capsule TAKE 1 CAPSULE BY MOUTH EVERY DAY 12/01/23  Yes Levora Reyes SAUNDERS, MD   Social History   Socioeconomic History   Marital status: Married    Spouse name: Not on file   Number of children: Not on file   Years of education: Not on file   Highest education level: Bachelor's degree (e.g., BA, AB, BS)  Occupational History   Not on file  Tobacco Use   Smoking status: Never   Smokeless  tobacco: Never  Substance and Sexual Activity   Alcohol use: Yes    Alcohol/week: 6.0 - 7.0 standard drinks of alcohol    Types: 4 Standard drinks or equivalent, 2 - 3 Glasses of wine per week    Comment: x a week   Drug use: No   Sexual activity: Yes  Other Topics Concern   Not on file  Social History Narrative   Not on file   Social Drivers of Health   Financial Resource Strain: Low Risk  (03/23/2024)   Overall Financial Resource Strain (CARDIA)    Difficulty of Paying Living Expenses: Not hard at all  Food Insecurity: No Food Insecurity (03/23/2024)   Hunger Vital Sign    Worried About Running Out of Food in the Last Year: Never true    Ran Out of Food in the Last Year: Never true  Transportation Needs: No Transportation Needs (03/23/2024)   PRAPARE - Administrator, Civil Service (Medical): No    Lack of Transportation (Non-Medical): No  Physical Activity: Sufficiently Active (03/23/2024)   Exercise Vital Sign    Days of Exercise per Week: 5 days    Minutes of Exercise per Session: 60 min  Stress: No Stress Concern Present (03/23/2024)   [employer] of Occupational Health - Occupational Stress Questionnaire    Feeling of Stress: Not at all  Social Connections: Socially Integrated (03/23/2024)   Social Connection and Isolation Panel    Frequency of Communication with Friends and Family: More than three times a week    Frequency of Social Gatherings with Friends and Family: Three times a week    Attends Religious Services: More than 4 times per year    Active Member of Clubs or Organizations: Yes    Attends Banker Meetings: More than 4 times per year    Marital Status: Married  Catering manager Violence: Not on file    Review of Systems  Constitutional:  Negative for fatigue and unexpected weight change.  Eyes:  Negative for visual disturbance.  Respiratory:  Negative for cough, chest tightness and shortness of breath.   Cardiovascular:  Negative  for chest pain, palpitations and leg swelling.  Gastrointestinal:  Negative for abdominal pain and blood in stool.  Neurological:  Negative for dizziness, light-headedness and headaches.     Objective:   Vitals:   03/24/24 0914  BP: 124/68  Pulse: 75  Resp: 15  Temp: 98 F (36.7 C)  TempSrc: Temporal  SpO2: 96%  Weight: 260 lb (117.9 kg)  Height: 6' 1.25 (1.861 m)     Physical Exam Vitals reviewed.  Constitutional:      Appearance: He is well-developed.  HENT:     Head: Normocephalic and atraumatic.  Neck:     Vascular: No carotid bruit or JVD.  Cardiovascular:     Rate and Rhythm: Normal rate and regular rhythm.     Heart sounds: Normal heart sounds. No murmur heard. Pulmonary:     Effort: Pulmonary effort is normal.     Breath sounds: Normal breath sounds. No rales.  Musculoskeletal:     Right lower leg: No edema.     Left lower leg: No edema.  Skin:    General: Skin is warm and dry.  Neurological:     Mental Status: He is alert and oriented to person, place, and time.  Psychiatric:        Mood and Affect: Mood normal.        Assessment & Plan:  Nathanael Krist is a 68 y.o. male . Essential hypertension - Plan: Comprehensive metabolic panel with GFR  -  Stable, tolerating current regimen. Medications refilled. Labs pending as above.   Prediabetes - Plan: Hemoglobin A1c  - Weight stable, continue exercise, watching diet, check A1c and adjust plan accordingly.  He is still considering whether or not to use semaglutide .  Advised to let me know if he does start that medication.  Hyperlipidemia, unspecified hyperlipidemia type - Plan: Lipid panel  - Tolerating current med regimen, continue statin.  Check labs and adjust plan accordingly  No orders of the defined types were placed in this encounter.  Patient Instructions  Thank you for coming in today. No change in medications at this time. If there are any concerns on your bloodwork, I will let you know.  Take care!     Signed,   Reyes Pines, MD [facility], [facility] 03/24/24 9:48 AM

## 2024-03-24 NOTE — Patient Instructions (Signed)
 Thank you for coming in today. No change in medications at this time. If there are any concerns on your bloodwork, I will let you know. Take care!

## 2024-03-28 ENCOUNTER — Ambulatory Visit: Payer: Self-pay | Admitting: Family Medicine

## 2024-03-28 NOTE — Telephone Encounter (Signed)
Replied by lab notes.

## 2024-04-01 ENCOUNTER — Ambulatory Visit: Payer: Medicare HMO | Admitting: Family Medicine

## 2024-04-12 NOTE — Progress Notes (Signed)
 Patient: Brian Higgins Date of Birth: September 04, 1956  Reason for Visit: Follow up History from: Patient Primary Neurologist: Dohmeier  ASSESSMENT AND PLAN 68 y.o. year old male   1.  Moderate OSA on CPAP  - Mask refit for mask leak of 27, AHI 5.0 - At 5-20 cm water, RAMP OFF, feels like not getting enough pressure when he starts CPAP, I changed pressure settings 8-20 cm water, call if not beneficial - Recommend nightly use minimum 4 hours - Order to DME for mask refit, new supplies.  He is overdue for new supplies - Follow-up in 6 months virtually or sooner if needed  HISTORY OF PRESENT ILLNESS: Today 04/13/24 Had HST May 2024 showing moderate OSA that is a REM sleep dependent form of apnea.  CPAP set up 03/05/2023.  CPAP data shows 67% compliance greater than 4 hours, 5 to 20 cm water, leak 27.1, AHI 5.0 (obstructive 1.5, unknown 2.5, central 0). Delay in getting in for initial CPAP visit, he lost his insurance when he lost his job, had a delay in getting on Medicare, which he now has. Using CPAP doesn't sleep any longer with CPAP. He is a side sleeper. Using nasal pillow mask. He only sleeps about 5.5 hours. 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 Using current mask his January, he needs new supplies. He takes 10 mg melatonin at night to sleep. 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 He asked me to increase his minimum pressure, because when he starts falling asleep he feels he can't fall asleep on lower pressure 5-20 cm water.   HISTORY  Brian Higgins is a 68 y.o. male patient who is seen upon referral on 12/23/2022 from Dr Levora for a Sleep consultation. Chief concern according to patient :  I lived a long time on 5.5 hours of sleep. I  feel refreshed by a power nap, I am snoring and when I wake up I have trouble going back to sleep due to a roaring tinnitus dominantly in the left ear, usually around  4 AM.    I have the pleasure of seeing PRUITT TABOADA  on 12/23/22 a right -handed male.   12-23-2022:  No previous sleep study, seasonal allergies. Zyrtec in seasonal Prn. Tinnitus was new onset with COVID 19 infection in 06-2022.  Sleep relevant medical history: Nocturia may be once- no Sleep walking, no Tonsillectomy, no cervical spine surgery. No ENT.    Family medical /sleep history: son and sister on CPAP with OSA, both had also insomnia. Lost his eldest son in March 15 years ago at age 30. He unexplainably died in his sleep.    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 2  grandchildren.  The patient currently works full time. Daytime.  Tobacco use: none .  ETOH use ; 3-4 glasses a week,  Caffeine intake in form of Coffee( 4 cups a day , morning and after lunch more hot tea) Soda( /).  No energy drinks Exercise in form of Tennis twice weekly, walking 2-3 times a week- 4 miles average. Gym in Winter.     Sleep habits are as follows: The patient's dinner time is between 7.30-8 PM. The patient goes to  bed at 11 PM and continues to sleep for 5 hours,  reads in bed - asleep in 20 minutes. Avoids screen exposure.  Bedroom is cool, quiet and dark. White noise.  He wakes for one time and uses this for a 1 time bathroom break   The preferred sleep position is laterally, with the support of 1 pillow, flat bed .  Dreams are reportedly rare.  The patient wakes up spontaneously. 4.30-5  AM is the usual rise time. He/reports not always feeling refreshed or restored in AM, with symptoms such as dry mouth, morning tinnitus , no headaches, and residual fatigue. Naps are taken frequently, lasting from 20 to 30 minutes and are more refreshing than nocturnal sleep.   REVIEW OF SYSTEMS: Out of a complete 14 system review of  symptoms, the patient complains only of the following symptoms, and all other reviewed systems are negative.  See HPI  ALLERGIES: Allergies  Allergen Reactions   Other     Catgut suture did not dissolve--causes infection (2007)    HOME MEDICATIONS: Outpatient Medications Prior to Visit  Medication Sig Dispense Refill   atorvastatin  (LIPITOR) 10 MG tablet TAKE 1 TABLET BY MOUTH DAILY. CAN START WITH FEW DAY PER WEEK DOSING, INCREASE AS TOLERATED. 90 tablet 1   cetirizine (ZYRTEC) 10 MG tablet Take by mouth.     hydrochlorothiazide  (MICROZIDE ) 12.5 MG capsule TAKE 1 CAPSULE BY MOUTH EVERY DAY 90 capsule 3   No facility-administered medications prior to visit.    PAST MEDICAL HISTORY: Past Medical History:  Diagnosis Date   Allergy    Arthritis    Cancer (HCC)    [facility]reesia 07/30/2020   GERD (gastroesophageal reflux disease)    mild no meds   Hypertension    Prostate cancer (HCC)     PAST SURGICAL HISTORY: Past Surgical History:  Procedure Laterality Date   HAND TENDON SURGERY     Right  arm   JOINT REPLACEMENT N/A    [facility]reesia 07/30/2020   PROSTATE SURGERY N/A    [facility]reesia 07/30/2020   PROSTATECTOMY     ROTATOR CUFF REPAIR Bilateral left 2007 and right 09/2009   TOTAL KNEE ARTHROPLASTY Left 01/13/2017   Procedure: LEFT TOTAL KNEE ARTHROPLASTY;  Surgeon: Dempsey Moan, MD;  Location: [facility];  Service: Orthopedics;  Laterality: Left;    FAMILY HISTORY: Family History  Problem Relation Age of Onset   Cancer Mother    Cancer Father    Diabetes Father    Hyperlipidemia Father    Cancer Sister    Stroke Maternal Grandmother    Heart disease Maternal Grandfather     SOCIAL HISTORY: Social History   Socioeconomic History   Marital status: Married    Spouse name: Not on file   Number of children: Not on file   Years of education: Not on file   Highest education level: Bachelor's degree (e.g., BA, AB, BS)  Occupational History   Not on file  Tobacco Use    Smoking status: Never   Smokeless tobacco: Never  Vaping Use   Vaping status: Never Used  Substance and Sexual Activity   Alcohol use: Yes    Alcohol/week: 6.0 - 7.0 standard drinks of alcohol    Types: 4 Standard drinks or equivalent, 2 - 3 Glasses of wine per week    Comment: x a week   Drug use: No   Sexual activity: Yes  Other Topics Concern   Not on file  Social History Narrative   Not  on file   Social Drivers of Health   Financial Resource Strain: Low Risk  (03/23/2024)   Overall Financial Resource Strain (CARDIA)    Difficulty of Paying Living Expenses: Not hard at all  Food Insecurity: No Food Insecurity (03/23/2024)   Hunger Vital Sign    Worried About Running Out of Food in the Last Year: Never true    Ran Out of Food in the Last Year: Never true  Transportation Needs: No Transportation Needs (03/23/2024)   PRAPARE - Administrator, Civil Service (Medical): No    Lack of Transportation (Non-Medical): No  Physical Activity: Sufficiently Active (03/23/2024)   Exercise Vital Sign    Days of Exercise per Week: 5 days    Minutes of Exercise per Session: 60 min  Stress: No Stress Concern Present (03/23/2024)   [employer] of Occupational Health - Occupational Stress Questionnaire    Feeling of Stress: Not at all  Social Connections: Socially Integrated (03/23/2024)   Social Connection and Isolation Panel    Frequency of Communication with Friends and Family: More than three times a week    Frequency of Social Gatherings with Friends and Family: Three times a week    Attends Religious Services: More than 4 times per year    Active Member of Clubs or Organizations: Yes    Attends Banker Meetings: More than 4 times per year    Marital Status: Married  Catering manager Violence: Not on file    PHYSICAL EXAM  Vitals:   04/13/24 0741 04/13/24 0746  BP: (!) 180/84 134/82  Pulse: (!) 52   Weight: 264 lb 12.8 oz (120.1 kg)   Height: 6' 2 (1.88 m)     Body mass index is 34 kg/m.  Generalized: Well developed, in no acute distress  Neurological examination  Mentation: Alert oriented to time, place, history taking. Follows all commands speech and language fluent Cranial nerve II-XII: Pupils were equal round reactive to light. Extraocular movements were full, visual field were full on confrontational test. Facial sensation and strength were normal.  Head turning and shoulder shrug  were normal and symmetric. Motor: The motor testing reveals 5 over 5 strength of all 4 extremities. Good symmetric motor tone is noted throughout.  Gait and station: Gait is normal.   DIAGNOSTIC DATA (LABS, IMAGING, TESTING) - I reviewed patient records, labs, notes, testing and imaging myself where available.  Lab Results  Component Value Date   WBC 5.3 10/03/2023   HGB 15.6 10/03/2023   HCT 46.7 10/03/2023   MCV 95.5 10/03/2023   PLT 197.0 10/03/2023      Component Value Date/Time   NA 139 03/24/2024 0954   NA 140 08/02/2020 0833   K 4.2 03/24/2024 0954   CL 102 03/24/2024 0954   CO2 30 03/24/2024 0954   GLUCOSE 101 (H) 03/24/2024 0954   BUN 21 03/24/2024 0954   BUN 18 08/02/2020 0833   CREATININE 0.97 03/24/2024 0954   CREATININE 0.90 05/09/2016 0958   CALCIUM  9.7 03/24/2024 0954   PROT 7.2 03/24/2024 0954   PROT 6.9 08/02/2020 0833   ALBUMIN 4.7 03/24/2024 0954   ALBUMIN 4.7 08/02/2020 0833   AST 21 03/24/2024 0954   ALT 22 03/24/2024 0954   ALKPHOS 53 03/24/2024 0954   BILITOT 1.2 03/24/2024 0954   BILITOT 0.8 08/02/2020 0833   GFRNONAA 84 08/02/2020 0833   GFRNONAA >89 11/08/2015 0848   GFRAA 97 08/02/2020 0833   GFRAA >89 11/08/2015 0848  Lab Results  Component Value Date   CHOL 134 03/24/2024   HDL 40.80 03/24/2024   LDLCALC 73 03/24/2024   LDLDIRECT 125.0 08/08/2021   TRIG 103.0 03/24/2024   CHOLHDL 3 03/24/2024   Lab Results  Component Value Date   HGBA1C 6.3 03/24/2024   No results found for: [id number] Lab  Results  Component Value Date   TSH 2.13 10/03/2023   [name], AGNP-C, DNP 04/13/2024, 7:51 AM [facility] [address] [place], [place] [address] [phone]

## 2024-04-13 ENCOUNTER — Ambulatory Visit: Payer: Medicare Other | Admitting: Neurology

## 2024-04-13 ENCOUNTER — Telehealth: Payer: Self-pay

## 2024-04-13 ENCOUNTER — Encounter: Payer: Self-pay | Admitting: Neurology

## 2024-04-13 VITALS — BP 134/82 | HR 52 | Ht 74.0 in | Wt 264.8 lb

## 2024-04-13 DIAGNOSIS — G4733 Obstructive sleep apnea (adult) (pediatric): Secondary | ICD-10-CM

## 2024-04-13 NOTE — Patient Instructions (Addendum)
 I will order mask refit, check with your DME about this, your current mask is leaking  Great to see you today! Continue CPAP usage minimum 4 hours nightly Continue current settings Continue to replace supplies routinely through DME Follow-up in 1 year or sooner if needed. Thanks!!  DME [facility]  P [phone]

## 2024-04-13 NOTE — Telephone Encounter (Signed)
 Community message sent to [facility] for mask refit order

## 2024-04-21 DIAGNOSIS — G4733 Obstructive sleep apnea (adult) (pediatric): Secondary | ICD-10-CM | POA: Diagnosis not present

## 2024-05-13 DIAGNOSIS — M79645 Pain in left finger(s): Secondary | ICD-10-CM | POA: Diagnosis not present

## 2024-05-13 DIAGNOSIS — M65312 Trigger thumb, left thumb: Secondary | ICD-10-CM | POA: Diagnosis not present

## 2024-06-24 DIAGNOSIS — M65312 Trigger thumb, left thumb: Secondary | ICD-10-CM | POA: Diagnosis not present

## 2024-08-20 ENCOUNTER — Other Ambulatory Visit: Payer: Self-pay | Admitting: Family Medicine

## 2024-08-20 DIAGNOSIS — E785 Hyperlipidemia, unspecified: Secondary | ICD-10-CM

## 2024-10-04 ENCOUNTER — Encounter: Payer: Self-pay | Admitting: Family Medicine

## 2024-10-04 ENCOUNTER — Ambulatory Visit: Admitting: Family Medicine

## 2024-10-04 VITALS — BP 128/62 | HR 61 | Temp 98.1°F | Resp 17 | Ht 74.0 in | Wt 267.0 lb

## 2024-10-04 DIAGNOSIS — G4733 Obstructive sleep apnea (adult) (pediatric): Secondary | ICD-10-CM | POA: Diagnosis not present

## 2024-10-04 DIAGNOSIS — Z8546 Personal history of malignant neoplasm of prostate: Secondary | ICD-10-CM | POA: Diagnosis not present

## 2024-10-04 DIAGNOSIS — R7303 Prediabetes: Secondary | ICD-10-CM

## 2024-10-04 DIAGNOSIS — Z1329 Encounter for screening for other suspected endocrine disorder: Secondary | ICD-10-CM | POA: Diagnosis not present

## 2024-10-04 DIAGNOSIS — E785 Hyperlipidemia, unspecified: Secondary | ICD-10-CM

## 2024-10-04 DIAGNOSIS — I10 Essential (primary) hypertension: Secondary | ICD-10-CM

## 2024-10-04 DIAGNOSIS — Z0000 Encounter for general adult medical examination without abnormal findings: Secondary | ICD-10-CM | POA: Diagnosis not present

## 2024-10-04 LAB — LIPID PANEL
Cholesterol: 108 mg/dL (ref 28–200)
HDL: 40.8 mg/dL
LDL Cholesterol: 45 mg/dL (ref 10–99)
NonHDL: 67.11
Total CHOL/HDL Ratio: 3
Triglycerides: 109 mg/dL (ref 10.0–149.0)
VLDL: 21.8 mg/dL (ref 0.0–40.0)

## 2024-10-04 LAB — COMPREHENSIVE METABOLIC PANEL WITH GFR
ALT: 31 U/L (ref 3–53)
AST: 22 U/L (ref 5–37)
Albumin: 4.6 g/dL (ref 3.5–5.2)
Alkaline Phosphatase: 52 U/L (ref 39–117)
BUN: 20 mg/dL (ref 6–23)
CO2: 27 meq/L (ref 19–32)
Calcium: 9.3 mg/dL (ref 8.4–10.5)
Chloride: 105 meq/L (ref 96–112)
Creatinine, Ser: 0.93 mg/dL (ref 0.40–1.50)
GFR: 84.63 mL/min
Glucose, Bld: 84 mg/dL (ref 70–99)
Potassium: 4.3 meq/L (ref 3.5–5.1)
Sodium: 140 meq/L (ref 135–145)
Total Bilirubin: 1.3 mg/dL — ABNORMAL HIGH (ref 0.2–1.2)
Total Protein: 6.8 g/dL (ref 6.0–8.3)

## 2024-10-04 LAB — CBC
HCT: 42.4 % (ref 39.0–52.0)
Hemoglobin: 14.7 g/dL (ref 13.0–17.0)
MCHC: 34.6 g/dL (ref 30.0–36.0)
MCV: 93.7 fl (ref 78.0–100.0)
Platelets: 183 K/uL (ref 150.0–400.0)
RBC: 4.52 Mil/uL (ref 4.22–5.81)
RDW: 13.7 % (ref 11.5–15.5)
WBC: 4.5 K/uL (ref 4.0–10.5)

## 2024-10-04 LAB — TSH: TSH: 1.76 u[IU]/mL (ref 0.35–5.50)

## 2024-10-04 LAB — HEMOGLOBIN A1C: Hgb A1c MFr Bld: 5.8 % (ref 4.6–6.5)

## 2024-10-04 LAB — PSA: PSA: 0 ng/mL — ABNORMAL LOW (ref 0.10–4.00)

## 2024-10-04 MED ORDER — HYDROCHLOROTHIAZIDE 12.5 MG PO CAPS: 12.5000 mg | ORAL_CAPSULE | Freq: Every day | ORAL | 3 refills | Status: AC

## 2024-10-04 MED ORDER — ZEPBOUND 2.5 MG/0.5ML ~~LOC~~ SOAJ: 2.5000 mg | SUBCUTANEOUS | 1 refills | Status: AC

## 2024-10-04 NOTE — Progress Notes (Signed)
 "  Subjective:  Patient ID: Brian Higgins, male    DOB: 05/29/1956  Age: 69 y.o. MRN: [id number]  CC:  Chief Complaint  Patient presents with   Annual Exam    Patient would like to start a GLP-1. He has a hx of sleep apnea.     HPI Brian Higgins presents for Annual Exam PCP, me Ortho/hand, Dr. Shari, with trigger finger of left thumb last treated in October. Neuro/sleep, Dr. Chalice, [name], OSA on CPAP.  He would like to consider zepbound  given history of OSA, and for weight management with obesity, prediabetes.  He has taken zepbound  previously, then cost prohibitive. Would consider out of pocket if not covered.  Urology, Dr. Renda, history of prostatectomy in 2008 for prostate cancer.  Prediabetes: As above, previously treated zepbound  and would like to try again to see if covered now with sleep apnea indication. Lost weight on that med - up to 10mg  in past.  Weight has been increasing.  Weight was 261 in January 2025, up 6 pounds since that time. Pickleball 2-3x/week, tennis few days per week, walking 3-4 mi/day.  Lab Results  Component Value Date   HGBA1C 6.3 03/24/2024   Wt Readings from Last 3 Encounters:  10/04/24 267 lb (121.1 kg)  04/13/24 264 lb 12.8 oz (120.1 kg)  03/24/24 260 lb (117.9 kg)   Hyperlipidemia: Lipitor 10 mg daily without new myalgias or arthralgias.  Coronary calcium  score April 2024 at 40th percentile with score of 78.7. Lab Results  Component Value Date   CHOL 134 03/24/2024   HDL 40.80 03/24/2024   LDLCALC 73 03/24/2024   LDLDIRECT 125.0 08/08/2021   TRIG 103.0 03/24/2024   CHOLHDL 3 03/24/2024   Lab Results  Component Value Date   ALT 22 03/24/2024   AST 21 03/24/2024   ALKPHOS 53 03/24/2024   BILITOT 1.2 03/24/2024   Hypertension: On hydrochlorothiazide  12.5 mg daily without any side effects.  Home readings: highest 134/80.  BP Readings from Last 3 Encounters:  10/04/24 128/62  04/13/24 134/82  03/24/24 124/68   Lab  Results  Component Value Date   CREATININE 0.97 03/24/2024          10/04/2024    8:53 AM 03/24/2024    9:16 AM 10/03/2023    8:20 AM 12/26/2022   11:53 AM 12/26/2022   11:52 AM  Depression screen PHQ 2/9  Decreased Interest 0 0 0 0 0  Down, Depressed, Hopeless 0 0 0 0 0  PHQ - 2 Score 0 0 0 0 0  Altered sleeping 0 1 0 2   Tired, decreased energy 0 0 0 0   Change in appetite 0 0 0 0   Feeling bad or failure about yourself  0 0 0 0   Trouble concentrating 0 0 0 0   Moving slowly or fidgety/restless 0 0 0 0   Suicidal thoughts 0 0 0 0   PHQ-9 Score 0 1  0  2    Difficult doing work/chores Not difficult at all Not difficult at all  Not difficult at all      Data saved with a previous flowsheet row definition    Health Maintenance  Topic Date Due   Medicare Annual Wellness (AWV)  Never done   COVID-19 Vaccine (4 - 2025-26 season) 10/20/2024 (Originally 05/24/2024)   Colonoscopy  07/13/2025   Pneumococcal Vaccine: 50+ Years  Completed   Influenza Vaccine  Completed   Hepatitis C Screening  Completed  Zoster Vaccines- Shingrix  Completed   Meningococcal B Vaccine  Aged Out   DTaP/Tdap/Td  Discontinued  Colonoscopy 07/14/2015 repeat 10 years. Prostate: History of prostatectomy for prostate cancer treatment in 2008 and PSA has been monitored since that time here.  Lab Results  Component Value Date   PSA1 <0.1 08/02/2020   PSA1 <0.1 05/22/2018   PSA 0.01 (L) 10/03/2023   PSA <0.01 07/17/2015     Immunization History  Administered Date(s) Administered   Fluad Quad(high Dose 65+) 09/18/2022   Fluad Trivalent(High Dose 65+) 10/03/2023, 08/20/2024   Influenza,inj,Quad PF,6+ Mos 05/22/2015, 10/30/2017, 05/22/2018, 08/08/2021   Influenza-Unspecified 07/03/2020   PFIZER(Purple Top)SARS-COV-2 Vaccination 11/29/2019, 12/27/2019, 06/20/2020   PNEUMOCOCCAL CONJUGATE-20 10/03/2023   Tdap 09/24/2011   Zoster Recombinant(Shingrix) 08/11/2019, 02/05/2021  Declines covid booster.   Updated td at pharmacy recommended.   No results found. Had optho visit last year - appt in February. Reading glasses.  He does have a history of persistent tinnitus, and has seen audiologist, plans updated visit.   Dental: every 6 months.   Alcohol: few per week  Tobacco: None  Exercise: as above.   History Patient Active Problem List   Diagnosis Date Noted   Pain of left thumb 03/09/2024   Trigger thumb of left hand 03/09/2024   Sensorineural hearing loss (SNHL), bilateral 05/09/2022   Tinnitus of both ears 05/09/2022   Acute cough 09/11/2021   Tibialis posterior tendinitis, right 04/07/2018   Leg length discrepancy 04/07/2018   OA (osteoarthritis) of knee 01/13/2017   Prostate cancer (HCC) 11/21/2012   Past Medical History:  Diagnosis Date   Allergy    Arthritis    Cancer (HCC)    Phreesia 07/30/2020   Cataract    GERD (gastroesophageal reflux disease)    mild no meds   Hypertension    Prostate cancer [facility])    Sleep apnea    Past Surgical History:  Procedure Laterality Date   HAND TENDON SURGERY     Right  arm   JOINT REPLACEMENT N/A    [employer]reesia 07/30/2020   PROSTATE SURGERY N/A    Phreesia 07/30/2020   PROSTATECTOMY     ROTATOR CUFF REPAIR Bilateral left 2007 and right 09/2009   TOTAL KNEE ARTHROPLASTY Left 01/13/2017   Procedure: LEFT TOTAL KNEE ARTHROPLASTY;  Surgeon: Dempsey Moan, MD;  Location: [facility];  Service: Orthopedics;  Laterality: Left;   Allergies[1] Prior to Admission medications  Medication Sig Start Date End Date Taking? Authorizing Provider  atorvastatin  (LIPITOR) 10 MG tablet TAKE 1 TABLET BY MOUTH DAILY. CAN START WITH FEW DAY PER WEEK DOSING, INCREASE AS TOLERATED. 08/24/24  Yes Levora Reyes SAUNDERS, MD  cetirizine (ZYRTEC) 10 MG tablet Take by mouth. 05/09/22  Yes [provider]  hydrochlorothiazide  (MICROZIDE ) 12.5 MG capsule TAKE 1 CAPSULE BY MOUTH EVERY DAY 12/01/23  Yes Levora Reyes SAUNDERS, MD   Social History   Socioeconomic  History   Marital status: Married    Spouse name: Not on file   Number of children: Not on file   Years of education: Not on file   Highest education level: Bachelor's degree (e.g., BA, AB, BS)  Occupational History   Not on file  Tobacco Use   Smoking status: Never   Smokeless tobacco: Never  Vaping Use   Vaping status: Never Used  Substance and Sexual Activity   Alcohol use: Yes    Alcohol/week: 4.0 standard drinks of alcohol    Types: 2 Glasses of wine, 2 Standard drinks  or equivalent per week    Comment: x a week   Drug use: Never   Sexual activity: Yes  Other Topics Concern   Not on file  Social History Narrative   Not on file   Social Drivers of Health   Tobacco Use: Low Risk (10/04/2024)   Patient History    Smoking Tobacco Use: Never    Smokeless Tobacco Use: Never    Passive Exposure: Not on file  Financial Resource Strain: Low Risk (10/03/2024)   Overall Financial Resource Strain (CARDIA)    Difficulty of Paying Living Expenses: Not very hard  Food Insecurity: No Food Insecurity (10/03/2024)   Epic    Worried About Programme Researcher, Broadcasting/film/video in the Last Year: Never true    Ran Out of Food in the Last Year: Never true  Transportation Needs: No Transportation Needs (10/03/2024)   Epic    Lack of Transportation (Medical): No    Lack of Transportation (Non-Medical): No  Physical Activity: Sufficiently Active (10/03/2024)   Exercise Vital Sign    Days of Exercise per Week: 4 days    Minutes of Exercise per Session: 60 min  Stress: No Stress Concern Present (10/03/2024)   [employer] of Occupational Health - Occupational Stress Questionnaire    Feeling of Stress: Not at all  Social Connections: Socially Integrated (10/03/2024)   Social Connection and Isolation Panel    Frequency of Communication with Friends and Family: More than three times a week    Frequency of Social Gatherings with Friends and Family: Once a week    Attends Religious Services: More than 4 times  per year    Active Member of [employer] or Organizations: Yes    Attends Banker Meetings: More than 4 times per year    Marital Status: Married  Catering Manager Violence: Not on file  Depression (PHQ2-9): Low Risk (10/04/2024)   Depression (PHQ2-9)    PHQ-2 Score: 0  Alcohol Screen: Low Risk (10/03/2024)   Alcohol Screen    Last Alcohol Screening Score (AUDIT): 4  Housing: Unknown (10/03/2024)   Epic    Unable to Pay for Housing in the Last Year: No    Number of Times Moved in the Last Year: Not on file    Homeless in the Last Year: No  Utilities: Not on file  Health Literacy: Not on file    Review of Systems 13 point review of systems per patient health survey noted.  Negative other than as indicated above or in HPI.    Objective:   Vitals:   10/04/24 0849  BP: 128/62  Pulse: 61  Resp: 17  Temp: 98.1 F (36.7 C)  TempSrc: Temporal  SpO2: 99%  Weight: 267 lb (121.1 kg)  Height: 6' 2 (1.88 m)     Physical Exam Vitals reviewed.  Constitutional:      Appearance: He is well-developed.  HENT:     Head: Normocephalic and atraumatic.     Right Ear: External ear normal.     Left Ear: External ear normal.  Eyes:     Conjunctiva/sclera: Conjunctivae normal.     Pupils: Pupils are equal, round, and reactive to light.  Neck:     Thyroid : No thyromegaly.  Cardiovascular:     Rate and Rhythm: Normal rate and regular rhythm.     Heart sounds: Normal heart sounds.  Pulmonary:     Effort: Pulmonary effort is normal. No respiratory distress.     Breath sounds: Normal breath  sounds. No wheezing.  Abdominal:     General: There is no distension.     Palpations: Abdomen is soft.     Tenderness: There is no abdominal tenderness.  Musculoskeletal:        General: No tenderness. Normal range of motion.     Cervical back: Normal range of motion and neck supple.  Lymphadenopathy:     Cervical: No cervical adenopathy.  Skin:    General: Skin is warm and dry.   Neurological:     Mental Status: He is alert and oriented to person, place, and time.     Deep Tendon Reflexes: Reflexes are normal and symmetric.  Psychiatric:        Behavior: Behavior normal.        Assessment & Plan:  Brian Higgins is a 69 y.o. male . Annual physical exam  - -anticipatory guidance as below in AVS, screening labs above. Health maintenance items as above in HPI discussed/recommended as applicable.   Prediabetes  - Check A1c, commended on his exercise.  Unfortunately has started to increase again off the zepbound .  He does have OSA on CPAP, will order zepbound  for that coverage but if not covered by insurance he is considering out-of-pocket through direct pharmacy.  He will let me know.  1 month follow-up to adjust dose.  Likely will increase to 5 mg at that time, has tolerated up to 10 mg.  Potential side effects and risks of that medication have been discussed.  Essential hypertension  - Stable, no med changes, check labs and adjust plan accordingly.  Hyperlipidemia, unspecified hyperlipidemia type  - Tolerating statin, check labs and adjust plan accordingly  History of prostate cancer (HCC)  - Check PSA for monitoring.  Screening for thyroid  disorder  - Check TSH.  OSA on CPAP - Plan: tirzepatide  (ZEPBOUND ) 2.5 MG/0.5ML Pen  - Followed by sleep specialist, stable with use of CPAP.  Unfortunately weight has increased, with indication for sleep apnea treatment will write for zepbound  as tolerated previously.  Additionally will help weight as noted previously.   Meds ordered this encounter  Medications   tirzepatide  (ZEPBOUND ) 2.5 MG/0.5ML Pen    Sig: Inject 2.5 mg into the skin once a week.    Dispense:  2 mL    Refill:  1   Patient Instructions  Thank you for coming in today. I have ordered the zepbound , but if not covered we can look at ordering through the company directly as may be less costly.  Follow-up in 1 month with virtual visit to decide on  higher dose.  No other change in medications at this time. If there are any concerns on your bloodwork, I will let you know. Take care!   Preventive Care 37 Years and Older, Male Preventive care refers to lifestyle choices and visits with your health care provider that can promote health and wellness. Preventive care visits are also called wellness exams. What can I expect for my preventive care visit? Counseling During your preventive care visit, your health care provider may ask about your: Medical history, including: Past medical problems. Family medical history. History of falls. Current health, including: Emotional well-being. Home life and relationship well-being. Sexual activity. Memory and ability to understand (cognition). Lifestyle, including: Alcohol, nicotine or tobacco, and drug use. Access to firearms. Diet, exercise, and sleep habits. Work and work astronomer. Sunscreen use. Safety issues such as seatbelt and bike helmet use. Physical exam Your health care provider will check your: Height and  weight. These may be used to calculate your BMI (body mass index). BMI is a measurement that tells if you are at a healthy weight. Waist circumference. This measures the distance around your waistline. This measurement also tells if you are at a healthy weight and may help predict your risk of certain diseases, such as type 2 diabetes and high blood pressure. Heart rate and blood pressure. Body temperature. Skin for abnormal spots. What immunizations do I need?  Vaccines are usually given at various ages, according to a schedule. Your health care provider will recommend vaccines for you based on your age, medical history, and lifestyle or other factors, such as travel or where you work. What tests do I need? Screening Your health care provider may recommend screening tests for certain conditions. This may include: Lipid and cholesterol levels. Diabetes screening. This is  done by checking your blood sugar (glucose) after you have not eaten for a while (fasting). Hepatitis C test. Hepatitis B test. HIV (human immunodeficiency virus) test. STI (sexually transmitted infection) testing, if you are at risk. Lung cancer screening. Colorectal cancer screening. Prostate cancer screening. Abdominal aortic aneurysm (AAA) screening. You may need this if you are a current or former smoker. Talk with your health care provider about your test results, treatment options, and if necessary, the need for more tests. Follow these instructions at home: Eating and drinking  Eat a diet that includes fresh fruits and vegetables, whole grains, lean protein, and low-fat dairy products. Limit your intake of foods with high amounts of sugar, saturated fats, and salt. Take vitamin and mineral supplements as recommended by your health care provider. Do not drink alcohol if your health care provider tells you not to drink. If you drink alcohol: Limit how much you have to 0-2 drinks a day. Know how much alcohol is in your drink. In the [place] one drink equals one 12 oz bottle of beer (355 mL), one 5 oz glass of wine (148 mL), or one 1 oz glass of hard liquor (44 mL). Lifestyle Brush your teeth every morning and night with fluoride  toothpaste. Floss one time each day. Exercise for at least 30 minutes 5 or more days each week. Do not use any products that contain nicotine or tobacco. These products include cigarettes, chewing tobacco, and vaping devices, such as e-cigarettes. If you need help quitting, ask your health care provider. Do not use drugs. If you are sexually active, practice safe sex. Use a condom or other form of protection to prevent STIs. Take aspirin only as told by your health care provider. Make sure that you understand how much to take and what form to take. Work with your health care provider to find out whether it is safe and beneficial for you to take aspirin  daily. Ask your health care provider if you need to take a cholesterol-lowering medicine (statin). Find healthy ways to manage stress, such as: Meditation, yoga, or listening to music. Journaling. Talking to a trusted person. Spending time with friends and family. Safety Always wear your seat belt while driving or riding in a vehicle. Do not drive: If you have been drinking alcohol. Do not ride with someone who has been drinking. When you are tired or distracted. While texting. If you have been using any mind-altering substances or drugs. Wear a helmet and other protective equipment during sports activities. If you have firearms in your house, make sure you follow all gun safety procedures. Minimize exposure to UV radiation to reduce  your risk of skin cancer. What's next? Visit your health care provider once a year for an annual wellness visit. Ask your health care provider how often you should have your eyes and teeth checked. Stay up to date on all vaccines. This information is not intended to replace advice given to you by your health care provider. Make sure you discuss any questions you have with your health care provider. Document Revised: 03/07/2021 Document Reviewed: 03/07/2021 Elsevier Patient Education  2024 Elsevier Inc.     Signed,   Reyes Pines, MD [facility], [facility] 10/04/2024 9:33 AM      [1]  Allergies Allergen Reactions   Other     Catgut suture did not dissolve--causes infection (2007)   "

## 2024-10-04 NOTE — Patient Instructions (Addendum)
 Thank you for coming in today. I have ordered the zepbound , but if not covered we can look at ordering through the company directly as may be less costly.  Follow-up in 1 month with virtual visit to decide on higher dose.  No other change in medications at this time. If there are any concerns on your bloodwork, I will let you know. Take care!   Preventive Care 35 Years and Older, Male Preventive care refers to lifestyle choices and visits with your health care provider that can promote health and wellness. Preventive care visits are also called wellness exams. What can I expect for my preventive care visit? Counseling During your preventive care visit, your health care provider may ask about your: Medical history, including: Past medical problems. Family medical history. History of falls. Current health, including: Emotional well-being. Home life and relationship well-being. Sexual activity. Memory and ability to understand (cognition). Lifestyle, including: Alcohol, nicotine or tobacco, and drug use. Access to firearms. Diet, exercise, and sleep habits. Work and work astronomer. Sunscreen use. Safety issues such as seatbelt and bike helmet use. Physical exam Your health care provider will check your: Height and weight. These may be used to calculate your BMI (body mass index). BMI is a measurement that tells if you are at a healthy weight. Waist circumference. This measures the distance around your waistline. This measurement also tells if you are at a healthy weight and may help predict your risk of certain diseases, such as type 2 diabetes and high blood pressure. Heart rate and blood pressure. Body temperature. Skin for abnormal spots. What immunizations do I need?  Vaccines are usually given at various ages, according to a schedule. Your health care provider will recommend vaccines for you based on your age, medical history, and lifestyle or other factors, such as travel or  where you work. What tests do I need? Screening Your health care provider may recommend screening tests for certain conditions. This may include: Lipid and cholesterol levels. Diabetes screening. This is done by checking your blood sugar (glucose) after you have not eaten for a while (fasting). Hepatitis C test. Hepatitis B test. HIV (human immunodeficiency virus) test. STI (sexually transmitted infection) testing, if you are at risk. Lung cancer screening. Colorectal cancer screening. Prostate cancer screening. Abdominal aortic aneurysm (AAA) screening. You may need this if you are a current or former smoker. Talk with your health care provider about your test results, treatment options, and if necessary, the need for more tests. Follow these instructions at home: Eating and drinking  Eat a diet that includes fresh fruits and vegetables, whole grains, lean protein, and low-fat dairy products. Limit your intake of foods with high amounts of sugar, saturated fats, and salt. Take vitamin and mineral supplements as recommended by your health care provider. Do not drink alcohol if your health care provider tells you not to drink. If you drink alcohol: Limit how much you have to 0-2 drinks a day. Know how much alcohol is in your drink. In the [place] one drink equals one 12 oz bottle of beer (355 mL), one 5 oz glass of wine (148 mL), or one 1 oz glass of hard liquor (44 mL). Lifestyle Brush your teeth every morning and night with fluoride  toothpaste. Floss one time each day. Exercise for at least 30 minutes 5 or more days each week. Do not use any products that contain nicotine or tobacco. These products include cigarettes, chewing tobacco, and vaping devices, such as e-cigarettes. If you  need help quitting, ask your health care provider. Do not use drugs. If you are sexually active, practice safe sex. Use a condom or other form of protection to prevent STIs. Take aspirin only as told by your  health care provider. Make sure that you understand how much to take and what form to take. Work with your health care provider to find out whether it is safe and beneficial for you to take aspirin daily. Ask your health care provider if you need to take a cholesterol-lowering medicine (statin). Find healthy ways to manage stress, such as: Meditation, yoga, or listening to music. Journaling. Talking to a trusted person. Spending time with friends and family. Safety Always wear your seat belt while driving or riding in a vehicle. Do not drive: If you have been drinking alcohol. Do not ride with someone who has been drinking. When you are tired or distracted. While texting. If you have been using any mind-altering substances or drugs. Wear a helmet and other protective equipment during sports activities. If you have firearms in your house, make sure you follow all gun safety procedures. Minimize exposure to UV radiation to reduce your risk of skin cancer. What's next? Visit your health care provider once a year for an annual wellness visit. Ask your health care provider how often you should have your eyes and teeth checked. Stay up to date on all vaccines. This information is not intended to replace advice given to you by your health care provider. Make sure you discuss any questions you have with your health care provider. Document Revised: 03/07/2021 Document Reviewed: 03/07/2021 Elsevier Patient Education  2024 [employer].

## 2024-10-10 ENCOUNTER — Ambulatory Visit: Payer: Self-pay | Admitting: Family Medicine

## 2024-11-04 ENCOUNTER — Ambulatory Visit: Admitting: Family Medicine

## 2024-11-10 ENCOUNTER — Telehealth: Admitting: Neurology

## 2024-12-01 ENCOUNTER — Ambulatory Visit: Admitting: Family Medicine
